# Patient Record
Sex: Female | Born: 1990 | Race: White | Hispanic: No | Marital: Married | Smoking: Former smoker
Health system: Southern US, Community
[De-identification: ages and names within clinical notes are randomized; demographics above are authoritative.]

## PROBLEM LIST (undated history)

## (undated) ENCOUNTER — Inpatient Hospital Stay (HOSPITAL_COMMUNITY): Payer: Self-pay

## (undated) DIAGNOSIS — F419 Anxiety disorder, unspecified: Secondary | ICD-10-CM

## (undated) DIAGNOSIS — F32A Depression, unspecified: Secondary | ICD-10-CM

## (undated) DIAGNOSIS — K589 Irritable bowel syndrome without diarrhea: Secondary | ICD-10-CM

## (undated) DIAGNOSIS — I499 Cardiac arrhythmia, unspecified: Secondary | ICD-10-CM

## (undated) DIAGNOSIS — D509 Iron deficiency anemia, unspecified: Secondary | ICD-10-CM

## (undated) DIAGNOSIS — N200 Calculus of kidney: Secondary | ICD-10-CM

## (undated) DIAGNOSIS — G43909 Migraine, unspecified, not intractable, without status migrainosus: Secondary | ICD-10-CM

## (undated) DIAGNOSIS — Z3490 Encounter for supervision of normal pregnancy, unspecified, unspecified trimester: Secondary | ICD-10-CM

## (undated) DIAGNOSIS — Q6589 Other specified congenital deformities of hip: Secondary | ICD-10-CM

## (undated) DIAGNOSIS — T8859XA Other complications of anesthesia, initial encounter: Secondary | ICD-10-CM

## (undated) DIAGNOSIS — F329 Major depressive disorder, single episode, unspecified: Secondary | ICD-10-CM

## (undated) DIAGNOSIS — T4145XA Adverse effect of unspecified anesthetic, initial encounter: Secondary | ICD-10-CM

## (undated) DIAGNOSIS — M797 Fibromyalgia: Secondary | ICD-10-CM

## (undated) DIAGNOSIS — F250 Schizoaffective disorder, bipolar type: Secondary | ICD-10-CM

## (undated) DIAGNOSIS — M533 Sacrococcygeal disorders, not elsewhere classified: Secondary | ICD-10-CM

## (undated) HISTORY — DX: Migraine, unspecified, not intractable, without status migrainosus: G43.909

## (undated) HISTORY — DX: Iron deficiency anemia, unspecified: D50.9

## (undated) HISTORY — DX: Major depressive disorder, single episode, unspecified: F32.9

## (undated) HISTORY — PX: SHOULDER SURGERY: SHX246

## (undated) HISTORY — DX: Cardiac arrhythmia, unspecified: I49.9

## (undated) HISTORY — DX: Fibromyalgia: M79.7

## (undated) HISTORY — DX: Depression, unspecified: F32.A

## (undated) HISTORY — DX: Calculus of kidney: N20.0

## (undated) HISTORY — DX: Anxiety disorder, unspecified: F41.9

---

## 2008-10-22 ENCOUNTER — Emergency Department (HOSPITAL_COMMUNITY): Admission: EM | Admit: 2008-10-22 | Discharge: 2008-10-23 | Payer: Self-pay | Admitting: Emergency Medicine

## 2009-03-14 ENCOUNTER — Inpatient Hospital Stay (HOSPITAL_COMMUNITY): Admission: AD | Admit: 2009-03-14 | Discharge: 2009-03-14 | Payer: Self-pay | Admitting: Obstetrics and Gynecology

## 2009-03-19 ENCOUNTER — Inpatient Hospital Stay (HOSPITAL_COMMUNITY): Admission: AD | Admit: 2009-03-19 | Discharge: 2009-03-19 | Payer: Self-pay | Admitting: Obstetrics and Gynecology

## 2009-03-27 ENCOUNTER — Inpatient Hospital Stay (HOSPITAL_COMMUNITY): Admission: AD | Admit: 2009-03-27 | Discharge: 2009-03-27 | Payer: Self-pay | Admitting: Obstetrics and Gynecology

## 2009-03-27 ENCOUNTER — Inpatient Hospital Stay (HOSPITAL_COMMUNITY): Admission: AD | Admit: 2009-03-27 | Discharge: 2009-03-30 | Payer: Self-pay | Admitting: Obstetrics and Gynecology

## 2011-04-12 LAB — CBC
HCT: 27.2 % — ABNORMAL LOW (ref 36.0–46.0)
Hemoglobin: 10.8 g/dL — ABNORMAL LOW (ref 12.0–15.0)
MCHC: 33.5 g/dL (ref 30.0–36.0)
MCV: 82.7 fL (ref 78.0–100.0)
MCV: 83.7 fL (ref 78.0–100.0)
RBC: 3.26 MIL/uL — ABNORMAL LOW (ref 3.87–5.11)
RBC: 3.88 MIL/uL (ref 3.87–5.11)
RDW: 15.4 % (ref 11.5–15.5)
WBC: 10.2 10*3/uL (ref 4.0–10.5)
WBC: 9.3 10*3/uL (ref 4.0–10.5)

## 2011-04-12 LAB — RPR: RPR Ser Ql: NONREACTIVE

## 2011-05-15 NOTE — Discharge Summary (Signed)
Teresa Bartlett, Teresa Bartlett          ACCOUNT NO.:  [id number]   MEDICAL RECORD NO.:  [id number]          PATIENT TYPE:  INP   LOCATION:  9110                          FACILITY:  [facility]   PHYSICIAN:  Sherron Monday, MD        DATE OF BIRTH:  03-14-1990   DATE OF ADMISSION:  03/27/2009  DATE OF DISCHARGE:  03/30/2009                               DISCHARGE SUMMARY   ADMITTING DIAGNOSIS:  Intrauterine pregnancy at term with spontaneous  rupture of membranes.   DISCHARGE DIAGNOSIS:  Intrauterine pregnancy at term with spontaneous  rupture of membranes, delivered.   HISTORY OF PRESENT ILLNESS:  A 21 year old G1, P0 at 31 plus weeks by 34-  week ultrasound with EDC of April 12, 2009, presents with complaints of  questionable loss of fluid and contractions.  She had been seen with  similar symptoms, previously had been unable to document rupture of  membranes and fundus exam had a positive fern.  Her prenatal care was  complicated by anxiety that was treated with Zoloft and p.r.n. Xanax at  32 weeks, otherwise uncomplicated care.  Please see the prenatal forms  for complete history.   PAST MEDICAL HISTORY:  Significant for mild asthma, irritable bowel  syndrome, anxiety, and depression.   PAST SURGICAL HISTORY:  Not significant.   PAST OB/GYN HISTORY:  G1 is present pregnancy.  No history of any  abnormal Pap smears or sexually transmitted diseases.   MEDICATIONS:  Zoloft.   ALLERGIES:  No known drug allergies.   SOCIAL HISTORY:  The patient denies alcohol, tobacco, or drug use.   PRENATAL LABORATORY DATA:  RPR nonreactive, rubella immune, hepatitis B  surface antigen negative, and HIV negative.  Blood type O negative.  Antibody screen negative.  Gonorrhea negative and chlamydia negative.  Cystic fibrosis screen negative.  Glucola is 95.  Group B strep was  positive.   On admission, she was afebrile.  Vital signs stable on a benign exam.  Penicillin was used for group B strep  prophylaxis and she was admitted  and Pitocin was used to augment her labor.  She progressed to complete,  complete +2, pushed for approximately 4-5 minutes to deliver a viable  female infant at 955 with Apgars of 8 at 1-minute and 9 at 5-minutes and a  weight of 7 pounds 4 ounces.  Placenta was expressed intact at 955.  OB  lacerations were first-degree perineal bilateral labial, repaired with 3-  0 Vicryl in typical fashion.  EBL was less than 500 mL.  Her postpartum  course was relatively uncomplicated.  She remained afebrile with vital  signs stable and her hemoglobin decreased from 10.8-8.9, which she was  tolerating well.  On the day of discharge postpartum day #2, she was  without complaint.  She had normal lochia.  Her pain was well  controlled.  She was ambulating and voiding without difficulty.  She was  discharged home with routine discharge instructions and numbers to call  if any questions or problems as well as prescriptions for Motrin,  Vicodin, prenatal vitamins and iron.  She will follow up in the office  in approximately 6 weeks.   DISCHARGE INFORMATION:  She is O negative, rubella immune, breast-  feeding.  Hemoglobin 10.8 and she is unsure about contraception.  This  will be discussed at a 6-week checkup.      Sherron Monday, MD  Electronically Signed     JB/MEDQ  D:  03/30/2009  T:  03/30/2009  Job:  [id number]

## 2011-09-27 ENCOUNTER — Encounter: Payer: Self-pay | Admitting: Obstetrics & Gynecology

## 2011-10-02 LAB — URINALYSIS, ROUTINE W REFLEX MICROSCOPIC
Hgb urine dipstick: NEGATIVE
Nitrite: NEGATIVE
Protein, ur: NEGATIVE
pH: 6

## 2011-10-02 LAB — CBC
HCT: 36.8
Hemoglobin: 12.3
Platelets: 287
RBC: 4.16
WBC: 10.3

## 2011-10-02 LAB — URINE MICROSCOPIC-ADD ON

## 2011-10-02 LAB — DIFFERENTIAL
Basophils Absolute: 0.1
Basophils Relative: 1
Lymphocytes Relative: 28
Monocytes Absolute: 0.9
Neutro Abs: 6

## 2011-10-02 LAB — POCT PREGNANCY, URINE: Preg Test, Ur: POSITIVE

## 2012-01-01 LAB — HM PAP SMEAR

## 2012-01-01 NOTE — L&D Delivery Note (Signed)
Delivery Note Pt received epidural but was never completely functional.  She progressed rapidly to complete dilation and pushed about 5 minutes.  At 3:11 AM a healthy female was delivered via Vaginal, Spontaneous Delivery (Presentation: ; Occiput Anterior).  APGAR: 8, 9; weight pending .   Placenta status: Intact, Spontaneous.  Cord:  with the following complications: Nuchal x 1 delivered through.  Anesthesia: Epidural Local  Episiotomy: None Lacerations: 1st degree;Vaginal Suture Repair: 3.0 vicryl rapide Est. Blood Loss (mL): 400cc  Mom to postpartum.  Baby to nursery-stable. Rh negative, Declines circumcision. [name] 07/01/2012, 3:31 AM

## 2012-01-23 LAB — OB RESULTS CONSOLE ABO/RH: RH Type: NEGATIVE

## 2012-01-23 LAB — OB RESULTS CONSOLE GC/CHLAMYDIA: Gonorrhea: NEGATIVE

## 2012-01-23 LAB — OB RESULTS CONSOLE ANTIBODY SCREEN: Antibody Screen: NEGATIVE

## 2012-01-23 LAB — OB RESULTS CONSOLE RUBELLA ANTIBODY, IGM: Rubella: IMMUNE

## 2012-02-20 ENCOUNTER — Ambulatory Visit: Payer: Medicaid Other | Attending: Obstetrics and Gynecology | Admitting: Physical Therapy

## 2012-02-20 DIAGNOSIS — M25559 Pain in unspecified hip: Secondary | ICD-10-CM | POA: Insufficient documentation

## 2012-02-20 DIAGNOSIS — R262 Difficulty in walking, not elsewhere classified: Secondary | ICD-10-CM | POA: Insufficient documentation

## 2012-02-20 DIAGNOSIS — IMO0001 Reserved for inherently not codable concepts without codable children: Secondary | ICD-10-CM | POA: Insufficient documentation

## 2012-02-20 DIAGNOSIS — M25669 Stiffness of unspecified knee, not elsewhere classified: Secondary | ICD-10-CM | POA: Insufficient documentation

## 2012-02-25 ENCOUNTER — Ambulatory Visit: Payer: Self-pay | Admitting: Physical Therapy

## 2012-02-27 ENCOUNTER — Ambulatory Visit: Payer: Medicaid Other

## 2012-03-05 ENCOUNTER — Ambulatory Visit: Payer: Medicaid Other | Attending: Obstetrics and Gynecology | Admitting: Physical Therapy

## 2012-03-05 DIAGNOSIS — R262 Difficulty in walking, not elsewhere classified: Secondary | ICD-10-CM | POA: Insufficient documentation

## 2012-03-05 DIAGNOSIS — IMO0001 Reserved for inherently not codable concepts without codable children: Secondary | ICD-10-CM | POA: Insufficient documentation

## 2012-03-05 DIAGNOSIS — M25669 Stiffness of unspecified knee, not elsewhere classified: Secondary | ICD-10-CM | POA: Insufficient documentation

## 2012-03-05 DIAGNOSIS — M25559 Pain in unspecified hip: Secondary | ICD-10-CM | POA: Insufficient documentation

## 2012-03-26 DIAGNOSIS — O479 False labor, unspecified: Secondary | ICD-10-CM

## 2012-03-27 ENCOUNTER — Inpatient Hospital Stay (HOSPITAL_COMMUNITY)
Admission: AD | Admit: 2012-03-27 | Discharge: 2012-03-27 | Disposition: A | Discharge status: Home / Self Care | Payer: Medicaid Other | Source: Ambulatory Visit | Attending: Obstetrics and Gynecology | Admitting: Obstetrics and Gynecology

## 2012-03-27 ENCOUNTER — Encounter (HOSPITAL_COMMUNITY): Payer: Self-pay

## 2012-03-27 DIAGNOSIS — O4700 False labor before 37 completed weeks of gestation, unspecified trimester: Secondary | ICD-10-CM | POA: Insufficient documentation

## 2012-03-27 DIAGNOSIS — O479 False labor, unspecified: Secondary | ICD-10-CM

## 2012-03-27 NOTE — MAU Note (Signed)
Ctx's started this morning, every hour, have continued and gotten closer all day, timing since 1500, now 7-10 min.  They have gotten stronger.

## 2012-03-27 NOTE — Discharge Instructions (Signed)

## 2012-03-27 NOTE — MAU Provider Note (Signed)
  History     CSN: [id number]  Arrival date and time: 03/27/12 1730   None     Chief Complaint  Patient presents with  . Labor Eval   HPI Teresa Bartlett is 22 y.o. G2P1001 [redacted]w[redacted]d weeks presenting with report of contractions.  Began this am, occuring "sporadic and hourly", and at 2:30 became q20-25 minutes a part and then at 5pm q5-8 minutes a part with 2 contractions bein q2 minutes.  Denies vaginal bleeding.  "extra" vaginal discharge but states it doesn't look like "liquidy, amniotic fluid".  Reports having a slow leak of amniotic fluid with previous discharge at 37 weeks, inducted 30 hrs later.  Patient is stable, reporting that the contractions are less often and not as strong at this time.     Past Medical History  Diagnosis Date  . Asthma     Past Surgical History  Procedure Date  . No past surgeries     No family history on file.  History  Substance Use Topics  . Smoking status: Never Smoker   . Smokeless tobacco: Not on file  . Alcohol Use: No    Allergies: Allergies not on file  No prescriptions prior to admission    Review of Systems  Gastrointestinal: Positive for abdominal pain (contractions).  Genitourinary:       Negative for vaginal bleeding or leaking of fluid. Positive for a little more discharge than normal   Physical Exam   Blood pressure 119/73, pulse 99, temperature 98.2 F (36.8 C), temperature source Oral, resp. rate 20, height 5' 4.5" (1.638 m), weight 120.657 kg (266 lb).  Physical Exam  Constitutional: She is oriented to person, place, and time. She appears well-developed and well-nourished. No distress.  Neurological: She is alert and oriented to person, place, and time.  Skin: Skin is warm and dry.  Psychiatric: She has a normal mood and affect. Her behavior is normal.   Cervical exam by Denny Peon, RN @19 :00  Cervix is closed, posterior, thick and high.   At the time of the cervical exam, the patient reports she is no longer having  contractions.   FMS baseline 145, moderate variability.  Contractions are not seen MAU Course  Procedures  MDM Reported FMS and cervical exam to Dr. Senaida Ores at 19:10.  Order given to discharge to home.  Assessment and Plan  A:  Braxton-Hicks Contractions  P:  Continue to stay well hydrated.      Keep your scheduled appointment Teresa Bartlett,EVE M 03/27/2012, 6:06 PM

## 2012-06-05 ENCOUNTER — Inpatient Hospital Stay (HOSPITAL_COMMUNITY)
Admission: AD | Admit: 2012-06-05 | Discharge: 2012-06-06 | DRG: 778 | Disposition: A | Discharge status: Home / Self Care | Payer: Medicaid Other | Source: Ambulatory Visit | Attending: Obstetrics and Gynecology | Admitting: Obstetrics and Gynecology

## 2012-06-05 ENCOUNTER — Encounter (HOSPITAL_COMMUNITY): Payer: Self-pay | Admitting: *Deleted

## 2012-06-05 DIAGNOSIS — O4700 False labor before 37 completed weeks of gestation, unspecified trimester: Principal | ICD-10-CM | POA: Diagnosis present

## 2012-06-05 DIAGNOSIS — O4702 False labor before 37 completed weeks of gestation, second trimester: Secondary | ICD-10-CM

## 2012-06-05 HISTORY — DX: Adverse effect of unspecified anesthetic, initial encounter: T41.45XA

## 2012-06-05 HISTORY — DX: Sacrococcygeal disorders, not elsewhere classified: M53.3

## 2012-06-05 HISTORY — DX: Other complications of anesthesia, initial encounter: T88.59XA

## 2012-06-05 HISTORY — DX: Other specified congenital deformities of hip: Q65.89

## 2012-06-05 NOTE — MAU Note (Signed)
Pt c/o u/c's all day and have most recently been q2-3 min since 2000.  Denies any leaking or bleeding.

## 2012-06-05 NOTE — MAU Note (Signed)
Contractions every 2-3 minutes since 830pm tonight. Cervix dilated 1/40% today in office. No Lof, no vaginal bleeding. Positive fetal movement.

## 2012-06-06 ENCOUNTER — Encounter (HOSPITAL_COMMUNITY): Payer: Self-pay | Admitting: *Deleted

## 2012-06-06 LAB — CBC
MCH: 28.1 pg (ref 26.0–34.0)
MCHC: 32.7 g/dL (ref 30.0–36.0)
RDW: 14.7 % (ref 11.5–15.5)

## 2012-06-06 LAB — RPR: RPR Ser Ql: NONREACTIVE

## 2012-06-06 MED ORDER — OXYCODONE-ACETAMINOPHEN 5-325 MG PO TABS: 1.0000 | ORAL_TABLET | ORAL | Status: DC | PRN

## 2012-06-06 MED ORDER — OXYTOCIN 20 UNITS IN LACTATED RINGERS INFUSION - SIMPLE: 125.0000 mL/h | Freq: Once | INTRAVENOUS | Status: DC

## 2012-06-06 MED ORDER — OXYTOCIN BOLUS FROM INFUSION
500.0000 mL | Freq: Once | INTRAVENOUS | Status: DC
  Filled 2012-06-06: qty 500

## 2012-06-06 MED ORDER — LACTATED RINGERS IV SOLN: 500.0000 mL | INTRAVENOUS | Status: DC | PRN

## 2012-06-06 MED ORDER — LIDOCAINE HCL (PF) 1 % IJ SOLN: 30.0000 mL | INTRAMUSCULAR | Status: DC | PRN

## 2012-06-06 MED ORDER — IBUPROFEN 600 MG PO TABS: 600.0000 mg | ORAL_TABLET | Freq: Four times a day (QID) | ORAL | Status: DC | PRN

## 2012-06-06 MED ORDER — PENICILLIN G POTASSIUM 5000000 UNITS IJ SOLR
2.5000 10*6.[IU] | INTRAVENOUS | Status: DC
  Administered 2012-06-06 (×2): 2.5 10*6.[IU] via INTRAVENOUS
  Filled 2012-06-06 (×4): qty 2.5

## 2012-06-06 MED ORDER — BUTORPHANOL TARTRATE 2 MG/ML IJ SOLN
INTRAMUSCULAR | Status: AC
  Filled 2012-06-06: qty 1

## 2012-06-06 MED ORDER — PENICILLIN G POTASSIUM 5000000 UNITS IJ SOLR
5.0000 10*6.[IU] | Freq: Once | INTRAVENOUS | Status: AC
  Administered 2012-06-06: 5 10*6.[IU] via INTRAVENOUS
  Filled 2012-06-06: qty 5

## 2012-06-06 MED ORDER — LACTATED RINGERS IV SOLN
INTRAVENOUS | Status: DC
  Administered 2012-06-06: 125 mL/h via INTRAVENOUS
  Administered 2012-06-06: 10:00:00 via INTRAVENOUS

## 2012-06-06 MED ORDER — BUTORPHANOL TARTRATE 2 MG/ML IJ SOLN: 1.0000 mg | Freq: Once | INTRAMUSCULAR | Status: DC

## 2012-06-06 NOTE — Discharge Summary (Signed)
  Discharge summary  Discharge DX Preterm pregnancy at 36 weeks False labor  Hospital course Pt observed for 12 hours due to painful contractions but cervix stayed 3.5 cm dilated the entire admission so she was d/c home to f/u in office

## 2012-06-06 NOTE — H&P (Signed)
NAMESHAMS, FILL NO.:  [id number]  MEDICAL RECORD NO.:  [id number]  LOCATION:  9160                          FACILITY:  [facility]  PHYSICIAN:  Malachi Pro. Ambrose Mantle, M.D. DATE OF BIRTH:  02-Sep-1990  DATE OF ADMISSION:  06/05/2012 DATE OF DISCHARGE:                             HISTORY & PHYSICAL   HISTORY OF PRESENT ILLNESS:  This is a 22 year old white female, para 1- 0-0-1, gravida 2, EDC is July 11, 2012, who was admitted because of contractions every 3-4 minutes and cervical change from 1-2 cm to 3-4 cm.  Blood group and type O negative with negative antibody.  Pap smear normal.  Rubella immune.  RPR nonreactive.  Urine culture negative. Hepatitis B surface antigen negative.  HIV negative.  GC and Chlamydia negative.  Cystic fibrosis negative.  Declined quad screen.  She was too late for first trimester screen. One hour Glucola 86.  Group B strep has not been done.  The patient had an ultrasound on January 22, 2012 that showed her average ultrasound age to be 15 weeks and 4 days with [facility] of July 11, 2012 by dates.  Her due date was May 22, 2012.  A follow up ultrasound on February 19, 2012 placed her at 19 weeks and 4 days with an [facility] of July 11, 2012.  On April 28, 2012, the patient complained of passing lots of mucus.  No significant contractions.  She came to our office today on June 05, 2012, on 2 occasions, the cervix was 1-2 cm, presenting part was high.  It did not change, but she states that after she went home her contractions got to be 2 minutes apart more painful. She came back to the maternity admission unit.  Her cervix was 3-4 cm, 50%, vertex at a -2 station.  She is admitted but nothing will be done to try a augment labor.  If she goes into labor, nothing will be done to stop it.  ALLERGIES:  No known allergies.  No latex allergy.  MEDICAL HISTORY:  History of generalized anxiety, asthma, depression, with her first pregnancy she did have group B  strep.  She has irritable bowel syndrome.  OBSTETRIC HISTORY:  On March 28, 2009, she delivered a 7-pound and 4- ounce female vaginally.  She is active, 2 years of college.  Denies alcohol, tobacco, and substance abuse.  FAMILY MEDICAL HISTORY:  Her mother has fibromyalgia.  Father has hypertension and he has had an MI.  Her paternal grandmother and grandfather have hypertension, and the paternal grandfather has heart disease.  PHYSICAL EXAMINATION:  GENERAL:  On admission, well-developed, quite obese white female, in no distress except for contractions every 3-4 minutes. VITAL SIGNS:  Temperature 98.5, pulse 100, respirations 16, blood pressure 100/60. HEART:  Normal size and sounds.  No murmurs. LUNGS:  Clear to auscultation. ABDOMEN:  Soft.  Fundal height, there was last recorded on May 12, 2012 was 31-1/2 cm.  She was examined in the office on June 05, 2012.  Her fundal height was 35 cm.  Fetal heart tones are normal.  The cervix is 3- 4 cm, 50%, vertex at a -2 station.  ADMITTING IMPRESSION:  Intrauterine pregnancy at 26  weeks and 6 days, seemed to be 35 weeks and 0 days, with cervical change.  The patient is admitted for observation and delivery if labor ensues.     Malachi Pro. Ambrose Mantle, M.D.     TFH/MEDQ  D:  06/05/2012  T:  06/06/2012  Job:  [id number]

## 2012-06-06 NOTE — Progress Notes (Signed)
Patient ID: Teresa Bartlett, female   DOB: 1990-09-10, 22 y.o.   MRN: [id number] Pt asking to go home. FHR reactive Cervix same at 50/3-4/-3  Sent home with labor precautions \ Declines ambien or pain meds

## 2012-06-07 ENCOUNTER — Inpatient Hospital Stay (HOSPITAL_COMMUNITY)
Admission: AD | Admit: 2012-06-07 | Discharge: 2012-06-07 | Disposition: A | Discharge status: Home / Self Care | Payer: Medicaid Other | Source: Ambulatory Visit | Attending: Obstetrics and Gynecology | Admitting: Obstetrics and Gynecology

## 2012-06-07 ENCOUNTER — Encounter (HOSPITAL_COMMUNITY): Payer: Self-pay | Admitting: Obstetrics and Gynecology

## 2012-06-07 DIAGNOSIS — O4700 False labor before 37 completed weeks of gestation, unspecified trimester: Secondary | ICD-10-CM | POA: Insufficient documentation

## 2012-06-07 NOTE — MAU Note (Signed)
Patient was admitted on Thursday night discharge on Friday, last night had contractions 3 minutes apart then settled down   35 weeks was 4/70

## 2012-06-15 ENCOUNTER — Inpatient Hospital Stay (HOSPITAL_COMMUNITY)
Admission: AD | Admit: 2012-06-15 | Discharge: 2012-06-15 | DRG: 778 | Disposition: A | Discharge status: Home / Self Care | Payer: Medicaid Other | Source: Ambulatory Visit | Attending: Obstetrics and Gynecology | Admitting: Obstetrics and Gynecology

## 2012-06-15 ENCOUNTER — Encounter (HOSPITAL_COMMUNITY): Payer: Self-pay | Admitting: *Deleted

## 2012-06-15 DIAGNOSIS — Z3490 Encounter for supervision of normal pregnancy, unspecified, unspecified trimester: Secondary | ICD-10-CM

## 2012-06-15 DIAGNOSIS — O4700 False labor before 37 completed weeks of gestation, unspecified trimester: Principal | ICD-10-CM | POA: Diagnosis present

## 2012-06-15 HISTORY — DX: Encounter for supervision of normal pregnancy, unspecified, unspecified trimester: Z34.90

## 2012-06-15 HISTORY — DX: Irritable bowel syndrome, unspecified: K58.9

## 2012-06-15 LAB — CBC
HCT: 35.1 % — ABNORMAL LOW (ref 36.0–46.0)
Hemoglobin: 11.6 g/dL — ABNORMAL LOW (ref 12.0–15.0)
MCH: 28.2 pg (ref 26.0–34.0)
MCHC: 33 g/dL (ref 30.0–36.0)
RBC: 4.11 MIL/uL (ref 3.87–5.11)

## 2012-06-15 MED ORDER — PENICILLIN G POTASSIUM 5000000 UNITS IJ SOLR
5.0000 10*6.[IU] | Freq: Once | INTRAVENOUS | Status: AC
  Administered 2012-06-15: 5 10*6.[IU] via INTRAVENOUS
  Filled 2012-06-15: qty 5

## 2012-06-15 MED ORDER — OXYTOCIN BOLUS FROM INFUSION
500.0000 mL | Freq: Once | INTRAVENOUS | Status: DC
  Filled 2012-06-15: qty 500

## 2012-06-15 MED ORDER — OXYCODONE-ACETAMINOPHEN 5-325 MG PO TABS: 1.0000 | ORAL_TABLET | ORAL | Status: DC | PRN

## 2012-06-15 MED ORDER — LACTATED RINGERS IV SOLN: 500.0000 mL | INTRAVENOUS | Status: DC | PRN

## 2012-06-15 MED ORDER — PENICILLIN G POTASSIUM 5000000 UNITS IJ SOLR
2.5000 10*6.[IU] | INTRAVENOUS | Status: DC
  Administered 2012-06-15: 2.5 10*6.[IU] via INTRAVENOUS
  Filled 2012-06-15 (×5): qty 2.5

## 2012-06-15 MED ORDER — LACTATED RINGERS IV SOLN
INTRAVENOUS | Status: DC
  Administered 2012-06-15: 125 mL/h via INTRAVENOUS

## 2012-06-15 MED ORDER — ACETAMINOPHEN 325 MG PO TABS: 650.0000 mg | ORAL_TABLET | ORAL | Status: DC | PRN

## 2012-06-15 MED ORDER — CITRIC ACID-SODIUM CITRATE 334-500 MG/5ML PO SOLN: 30.0000 mL | ORAL | Status: DC | PRN

## 2012-06-15 MED ORDER — ONDANSETRON HCL 4 MG/2ML IJ SOLN: 4.0000 mg | Freq: Four times a day (QID) | INTRAMUSCULAR | Status: DC | PRN

## 2012-06-15 MED ORDER — FLEET ENEMA 7-19 GM/118ML RE ENEM: 1.0000 | ENEMA | RECTAL | Status: DC | PRN

## 2012-06-15 MED ORDER — OXYTOCIN 20 UNITS IN LACTATED RINGERS INFUSION - SIMPLE: 125.0000 mL/h | Freq: Once | INTRAVENOUS | Status: DC

## 2012-06-15 MED ORDER — IBUPROFEN 600 MG PO TABS: 600.0000 mg | ORAL_TABLET | Freq: Four times a day (QID) | ORAL | Status: DC | PRN

## 2012-06-15 MED ORDER — BUTORPHANOL TARTRATE 2 MG/ML IJ SOLN
2.0000 mg | Freq: Once | INTRAMUSCULAR | Status: AC
  Administered 2012-06-15: 2 mg via INTRAVENOUS
  Filled 2012-06-15: qty 1

## 2012-06-15 MED ORDER — BUTORPHANOL TARTRATE 2 MG/ML IJ SOLN: 1.0000 mg | INTRAMUSCULAR | Status: DC | PRN

## 2012-06-15 MED ORDER — LIDOCAINE HCL (PF) 1 % IJ SOLN: 30.0000 mL | INTRAMUSCULAR | Status: DC | PRN

## 2012-06-15 NOTE — H&P (Signed)
Ramatoulaye Pack Swab is a 22 y.o. female G2P1001 at 36+ with contractions, ? Cervical change. +FM, no LOF, no VB, ctx - inc in intensity and frequency. Uncomplicated PNC except Hip dysplasia and late to care Maternal Medical History:  Reason for admission: Reason for admission: contractions.  Contractions: Onset was more than 2 days ago.   Frequency: regular.   Perceived severity is moderate.    Fetal activity: Perceived fetal activity is normal.      OB History    Grav Para Term Preterm Abortions TAB SAB Ect Mult Living   2 1 1  0 0 0 0 0 0 1    G1 37 wk SVD, 7#4, female; G2 present, no abn pap, no STDs Past Medical History  Diagnosis Date  . Asthma   . Hip dysplasia   . Sacroiliac joint dysfunction of both sides   . IBS (irritable bowel syndrome)   . Complication of anesthesia   . Normal pregnancy 06/15/2012   Past Surgical History  Procedure Date  . No past surgeries    Family History: family history includes Arthritis in her maternal grandmother and mother; Cancer in her paternal grandmother; Diabetes in her father, maternal grandmother, mother, and paternal grandfather; Heart disease in her father and paternal uncle; Hyperlipidemia in her father, paternal grandfather, and paternal grandmother; Hypertension in her father, paternal grandfather, and paternal grandmother; Kidney disease in her mother; and Mental illness in her father, mother, and paternal grandfather.  There is no history of Other. Social History:  reports that she quit smoking about 4 years ago. She does not have any smokeless tobacco history on file. She reports that she does not drink alcohol or use illicit drugs.married Meds PNV All NKDA   Prenatal Transfer Tool  Maternal Diabetes: No Genetic Screening: Declined Maternal Ultrasounds/Referrals: Normal Fetal Ultrasounds or other Referrals:  None Maternal Substance Abuse:  No Significant Maternal Medications:  None Significant Maternal Lab Results:  Lab values  include: Other: see prenatal record GBBS unknown Other Comments:  previa resolved  Review of Systems  Constitutional: Negative.   HENT: Negative.   Eyes: Negative.   Respiratory: Negative.   Cardiovascular: Negative.   Gastrointestinal: Negative.   Genitourinary: Negative.   Musculoskeletal: Negative.   Neurological: Negative.   Psychiatric/Behavioral: Negative.     Dilation: 4.5 Effacement (%): 70 Station: -3 Exam by:: L. McDaniel Rn Blood pressure 111/59, pulse 106, temperature 97.8 F (36.6 C), temperature source Oral, resp. rate 18, height 5\' 4"  (1.626 m), weight 124.739 kg (275 lb). Maternal Exam:  Uterine Assessment: Contraction strength is moderate.  Contraction frequency is irregular.   Abdomen: Fundal height is appropriate for gestation, obese.   Fetal presentation: vertex     Physical Exam  Constitutional: She is oriented to person, place, and time. She appears well-developed and well-nourished.  HENT:  Head: Normocephalic and atraumatic.  Neck: Normal range of motion. Neck supple. No thyromegaly present.  Cardiovascular: Normal rate and regular rhythm.   Respiratory: Effort normal and breath sounds normal. No respiratory distress.  GI: Soft. Bowel sounds are normal. There is no tenderness.  Musculoskeletal: Normal range of motion.  Neurological: She is alert and oriented to person, place, and time.  Skin: Skin is warm and dry.  Psychiatric: She has a normal mood and affect. Her behavior is normal.    Prenatal labs: ABO, Rh: O/Negative/-- (01/23 0000) Antibody: Negative (01/23 0000) Rubella: Immune (01/23 0000) RPR: NON REACTIVE (06/07 0015)  HBsAg: Negative (01/23 0000)  HIV: Non-reactive (  01/23 0000)  GBS:    Hgb 12.3/Plt 345K/ Pap WNL/ GC neg/ Chl neg/ CF neg/ declined screening/ glucola 86  Rhogam 4/19  15wk [place] to date, ant previa.  Female, nl anat, ant plac, previa resolved Assessment/Plan: 21yo G2P1001 at 36 + with contractions, continued, but  questionable cervical change.   Monitored for 6 hours, pt states hurting more.  Will give stadol and recheck in 2 hours, likely d/c if no change.  F/u in office as scheduled   BOVARD,Miken Stecher 06/15/2012, 9:55 AM

## 2012-06-15 NOTE — Progress Notes (Signed)
Dr Ellyn Hack notified of pt status, pain level, FHR tracing, UC pattern and SVE. Order to D/C pt home. Discussed POC with pt and has no further questions at this time.

## 2012-06-15 NOTE — MAU Note (Signed)
Contracting since 0230 Sat am. Had diarrhea 4times yest and some nausea. Did this with first preg. At 36 wks when went into labor. Ctxs painful since 2230. Leaked some fld once arrived to hospital. THinks could have been urine

## 2012-06-16 NOTE — Discharge Summary (Signed)
  Pt admitted at 36 + weeks for possible labor,Questionable cervical change in MAU.  Monitored for 8 hours with no cervical change.  Painful ctx - given dose of stadol, ctx spaced out.  Given IVF during admission.  Given labor precautions

## 2012-06-26 ENCOUNTER — Telehealth (HOSPITAL_COMMUNITY): Payer: Self-pay | Admitting: *Deleted

## 2012-06-26 ENCOUNTER — Encounter (HOSPITAL_COMMUNITY): Payer: Self-pay | Admitting: *Deleted

## 2012-06-26 NOTE — Telephone Encounter (Signed)
Preadmission screen  

## 2012-06-30 ENCOUNTER — Encounter (HOSPITAL_COMMUNITY): Payer: Self-pay | Admitting: *Deleted

## 2012-06-30 ENCOUNTER — Inpatient Hospital Stay (HOSPITAL_COMMUNITY)
Admission: AD | Admit: 2012-06-30 | Discharge: 2012-07-02 | DRG: 775 | Disposition: A | Discharge status: Home / Self Care | Payer: Medicaid Other | Source: Ambulatory Visit | Attending: Obstetrics and Gynecology | Admitting: Obstetrics and Gynecology

## 2012-06-30 LAB — CBC
MCH: 28.3 pg (ref 26.0–34.0)
MCHC: 33.2 g/dL (ref 30.0–36.0)
Platelets: 243 10*3/uL (ref 150–400)
RDW: 15 % (ref 11.5–15.5)

## 2012-06-30 MED ORDER — OXYTOCIN BOLUS FROM INFUSION
250.0000 mL | Freq: Once | INTRAVENOUS | Status: AC
  Administered 2012-07-01: 250 mL via INTRAVENOUS
  Filled 2012-06-30: qty 500

## 2012-06-30 MED ORDER — OXYTOCIN 40 UNITS IN LACTATED RINGERS INFUSION - SIMPLE MED: 1.0000 m[IU]/min | INTRAVENOUS | Status: DC

## 2012-06-30 MED ORDER — ONDANSETRON HCL 4 MG/2ML IJ SOLN: 4.0000 mg | Freq: Four times a day (QID) | INTRAMUSCULAR | Status: DC | PRN

## 2012-06-30 MED ORDER — OXYTOCIN 40 UNITS IN LACTATED RINGERS INFUSION - SIMPLE MED
62.5000 mL/h | Freq: Once | INTRAVENOUS | Status: AC
  Administered 2012-07-01: 62.5 mL/h via INTRAVENOUS
  Filled 2012-06-30: qty 1000

## 2012-06-30 MED ORDER — DIPHENHYDRAMINE HCL 50 MG/ML IJ SOLN: 12.5000 mg | INTRAMUSCULAR | Status: DC | PRN

## 2012-06-30 MED ORDER — FLEET ENEMA 7-19 GM/118ML RE ENEM: 1.0000 | ENEMA | RECTAL | Status: DC | PRN

## 2012-06-30 MED ORDER — OXYCODONE-ACETAMINOPHEN 5-325 MG PO TABS: 1.0000 | ORAL_TABLET | ORAL | Status: DC | PRN

## 2012-06-30 MED ORDER — LACTATED RINGERS IV SOLN
INTRAVENOUS | Status: DC
  Administered 2012-06-30: via INTRAVENOUS

## 2012-06-30 MED ORDER — LACTATED RINGERS IV SOLN
500.0000 mL | Freq: Once | INTRAVENOUS | Status: AC
  Administered 2012-06-30: 1000 mL via INTRAVENOUS

## 2012-06-30 MED ORDER — TERBUTALINE SULFATE 1 MG/ML IJ SOLN: 0.2500 mg | Freq: Once | INTRAMUSCULAR | Status: AC | PRN

## 2012-06-30 MED ORDER — PHENYLEPHRINE 40 MCG/ML (10ML) SYRINGE FOR IV PUSH (FOR BLOOD PRESSURE SUPPORT)
80.0000 ug | PREFILLED_SYRINGE | INTRAVENOUS | Status: DC | PRN
  Filled 2012-06-30: qty 2

## 2012-06-30 MED ORDER — LACTATED RINGERS IV SOLN: 500.0000 mL | INTRAVENOUS | Status: DC | PRN

## 2012-06-30 MED ORDER — EPHEDRINE 5 MG/ML INJ
10.0000 mg | INTRAVENOUS | Status: DC | PRN
  Filled 2012-06-30: qty 2
  Filled 2012-06-30: qty 4

## 2012-06-30 MED ORDER — LIDOCAINE HCL (PF) 1 % IJ SOLN
30.0000 mL | INTRAMUSCULAR | Status: DC | PRN
  Filled 2012-06-30: qty 30

## 2012-06-30 MED ORDER — FENTANYL 2.5 MCG/ML BUPIVACAINE 1/10 % EPIDURAL INFUSION (WH - ANES)
14.0000 mL/h | INTRAMUSCULAR | Status: DC
  Administered 2012-07-01 (×2): 14 mL/h via EPIDURAL
  Filled 2012-06-30 (×2): qty 60

## 2012-06-30 MED ORDER — EPHEDRINE 5 MG/ML INJ
10.0000 mg | INTRAVENOUS | Status: DC | PRN
  Filled 2012-06-30: qty 2

## 2012-06-30 MED ORDER — CITRIC ACID-SODIUM CITRATE 334-500 MG/5ML PO SOLN: 30.0000 mL | ORAL | Status: DC | PRN

## 2012-06-30 MED ORDER — PHENYLEPHRINE 40 MCG/ML (10ML) SYRINGE FOR IV PUSH (FOR BLOOD PRESSURE SUPPORT)
80.0000 ug | PREFILLED_SYRINGE | INTRAVENOUS | Status: DC | PRN
  Filled 2012-06-30: qty 2
  Filled 2012-06-30: qty 5

## 2012-06-30 MED ORDER — ACETAMINOPHEN 325 MG PO TABS: 650.0000 mg | ORAL_TABLET | ORAL | Status: DC | PRN

## 2012-06-30 MED ORDER — IBUPROFEN 600 MG PO TABS: 600.0000 mg | ORAL_TABLET | Freq: Four times a day (QID) | ORAL | Status: DC | PRN

## 2012-06-30 MED ORDER — ALBUTEROL SULFATE HFA 108 (90 BASE) MCG/ACT IN AERS
2.0000 | INHALATION_SPRAY | Freq: Four times a day (QID) | RESPIRATORY_TRACT | Status: DC | PRN
  Filled 2012-06-30: qty 6.7

## 2012-06-30 NOTE — H&P (Signed)
Teresa Bartlett is a 22 y.o. female G2P1001 ( EDD 07/11/12 by 15 week [place])  at 38 3/7 weeks  presenting for contractions for several hours building in intensity.  Observed in MAU with cervical change from 4cm to 5+ cm.  Has been admitted multiple times in the past month with contractions with cervix consistently 3-4 cm. Prenatal care uneventful otherwise.  O negative and received rhogam at 28 weeks.    Maternal Medical History:  Reason for admission: Reason for admission: contractions.  Contractions: Onset was 3-5 hours ago.   Frequency: regular.   Perceived severity is moderate.    Fetal activity: Perceived fetal activity is normal.      OB History    Grav Para Term Preterm Abortions TAB SAB Ect Mult Living   2 1 1  0 0 0 0 0 0 1    NSVD 2010 37 weeks 7#4oz  Past Medical History  Diagnosis Date  . Asthma   . Hip dysplasia   . Sacroiliac joint dysfunction of both sides   . IBS (irritable bowel syndrome)   . Normal pregnancy 06/15/2012  . Depression   . Anxiety   . IDA (iron deficiency anemia)   . Complication of anesthesia     convulsion with epidural redose   Past Surgical History  Procedure Date  . No past surgeries    Family History: family history includes Arthritis in her maternal grandmother and mother; Cancer in her paternal grandmother; Depression in her father; Diabetes in her father, maternal grandmother, mother, and paternal grandfather; Fibromyalgia in her mother; Heart attack in her father; Heart disease in her father, paternal grandfather, and paternal uncle; Hyperlipidemia in her father, paternal grandfather, and paternal grandmother; Hypertension in her father, paternal grandfather, and paternal grandmother; Kidney disease in her mother; and Mental illness in her father, mother, and paternal grandfather.  There is no history of Other. Social History:  reports that she quit smoking about 4 years ago. Her smoking use included Cigarettes. She quit after 2 years of  use. She has never used smokeless tobacco. She reports that she does not drink alcohol or use illicit drugs.   Prenatal Transfer Tool  Maternal Diabetes: No Genetic Screening: Declined Maternal Ultrasounds/Referrals: Normal Fetal Ultrasounds or other Referrals:  None Maternal Substance Abuse:  No Significant Maternal Medications:  None Significant Maternal Lab Results:  None Other Comments:  None  ROS  Dilation: 5 Effacement (%): 70 Station: -2 Exam by:: Elie Confer RN Blood pressure 102/63, pulse 112, temperature 98.1 F (36.7 C), temperature source Oral, resp. rate 16. Maternal Exam:  Uterine Assessment: Contraction strength is moderate.  Contraction frequency is regular.   Abdomen: Patient reports no abdominal tenderness. Fetal presentation: vertex  Introitus: Normal vulva. Normal vagina.    Physical Exam  Constitutional: She is oriented to person, place, and time. She appears well-developed and well-nourished.  Cardiovascular: Normal rate and regular rhythm.   Respiratory: Effort normal and breath sounds normal.  GI: Soft.  Genitourinary: Vagina normal.  Neurological: She is alert and oriented to person, place, and time.  Psychiatric: She has a normal mood and affect. Her behavior is normal.    Prenatal labs: ABO, Rh: O/Negative/-- (01/23 0000) Antibody: Negative (01/23 0000) Rubella: Immune (01/23 0000) RPR: NON REACTIVE (06/16 0550)  HBsAg: Negative (01/23 0000)  HIV: Non-reactive (01/23 0000)  GBS: Negative (06/17 0000)  One hour GTT 86 Declined genetic screens  Assessment/Plan: Pt observed in MAU with cervical change to 5cm and increasingly uncomfortable  with contractions.  Will admit to L&D and receive epidural with AROM and pitocin augmentation if needed. FHR reactive.   [name] 06/30/2012, 10:35 PM

## 2012-06-30 NOTE — MAU Note (Signed)
Pt c/o u/c's q 5 min apart for the past 45 min, pt says they are not strong but when she called office,  MD asked her to come in since she lives in [place].

## 2012-06-30 NOTE — Progress Notes (Signed)
Patient ID: Teresa Bartlett, female   DOB: 12-Sep-1990, 23 y.o.   MRN: [id number] Pt admitted and ctx q 3-5 minutes. Getting fluid bolus for epidural 80/5-6/-1  AROM clear  Pt to receive epidural, will augment as needed if slow progress.

## 2012-07-01 ENCOUNTER — Encounter (HOSPITAL_COMMUNITY): Payer: Self-pay | Admitting: Anesthesiology

## 2012-07-01 ENCOUNTER — Encounter (HOSPITAL_COMMUNITY): Payer: Self-pay | Admitting: *Deleted

## 2012-07-01 ENCOUNTER — Inpatient Hospital Stay (HOSPITAL_COMMUNITY): Payer: Medicaid Other | Admitting: Anesthesiology

## 2012-07-01 LAB — RPR: RPR Ser Ql: NONREACTIVE

## 2012-07-01 MED ORDER — DIPHENHYDRAMINE HCL 25 MG PO CAPS: 25.0000 mg | ORAL_CAPSULE | Freq: Four times a day (QID) | ORAL | Status: DC | PRN

## 2012-07-01 MED ORDER — IBUPROFEN 600 MG PO TABS
600.0000 mg | ORAL_TABLET | Freq: Four times a day (QID) | ORAL | Status: DC
  Administered 2012-07-01 – 2012-07-02 (×6): 600 mg via ORAL
  Filled 2012-07-01 (×6): qty 1

## 2012-07-01 MED ORDER — BENZOCAINE-MENTHOL 20-0.5 % EX AERO
1.0000 "application " | INHALATION_SPRAY | CUTANEOUS | Status: DC | PRN
  Filled 2012-07-01: qty 56

## 2012-07-01 MED ORDER — PRENATAL MULTIVITAMIN CH
1.0000 | ORAL_TABLET | Freq: Every day | ORAL | Status: DC
  Administered 2012-07-02: 1 via ORAL
  Filled 2012-07-01 (×2): qty 1

## 2012-07-01 MED ORDER — TETANUS-DIPHTH-ACELL PERTUSSIS 5-2.5-18.5 LF-MCG/0.5 IM SUSP: 0.5000 mL | Freq: Once | INTRAMUSCULAR | Status: DC

## 2012-07-01 MED ORDER — OXYCODONE-ACETAMINOPHEN 5-325 MG PO TABS
1.0000 | ORAL_TABLET | ORAL | Status: DC | PRN
  Administered 2012-07-01 (×3): 1 via ORAL
  Filled 2012-07-01 (×3): qty 1

## 2012-07-01 MED ORDER — BUPIVACAINE HCL (PF) 0.25 % IJ SOLN
INTRAMUSCULAR | Status: DC | PRN
  Administered 2012-07-01 (×2): 5 mL via EPIDURAL

## 2012-07-01 MED ORDER — LIDOCAINE HCL (PF) 1 % IJ SOLN
INTRAMUSCULAR | Status: DC | PRN
  Administered 2012-07-01 (×3): 4 mL
  Administered 2012-07-01: 30 mL

## 2012-07-01 MED ORDER — FENTANYL CITRATE 0.05 MG/ML IJ SOLN: 100.0000 ug | Freq: Once | INTRAMUSCULAR | Status: DC

## 2012-07-01 MED ORDER — DIBUCAINE 1 % RE OINT: 1.0000 "application " | TOPICAL_OINTMENT | RECTAL | Status: DC | PRN

## 2012-07-01 MED ORDER — ONDANSETRON HCL 4 MG/2ML IJ SOLN: 4.0000 mg | INTRAMUSCULAR | Status: DC | PRN

## 2012-07-01 MED ORDER — ZOLPIDEM TARTRATE 5 MG PO TABS: 5.0000 mg | ORAL_TABLET | Freq: Every evening | ORAL | Status: DC | PRN

## 2012-07-01 MED ORDER — LANOLIN HYDROUS EX OINT: TOPICAL_OINTMENT | CUTANEOUS | Status: DC | PRN

## 2012-07-01 MED ORDER — WITCH HAZEL-GLYCERIN EX PADS: 1.0000 "application " | MEDICATED_PAD | CUTANEOUS | Status: DC | PRN

## 2012-07-01 MED ORDER — ONDANSETRON HCL 4 MG PO TABS: 4.0000 mg | ORAL_TABLET | ORAL | Status: DC | PRN

## 2012-07-01 MED ORDER — SIMETHICONE 80 MG PO CHEW
80.0000 mg | CHEWABLE_TABLET | ORAL | Status: DC | PRN
  Administered 2012-07-01: 80 mg via ORAL

## 2012-07-01 MED ORDER — FENTANYL CITRATE 0.05 MG/ML IJ SOLN
INTRAMUSCULAR | Status: AC
  Filled 2012-07-01: qty 2

## 2012-07-01 MED ORDER — SENNOSIDES-DOCUSATE SODIUM 8.6-50 MG PO TABS
2.0000 | ORAL_TABLET | Freq: Every day | ORAL | Status: DC
  Administered 2012-07-01: 2 via ORAL

## 2012-07-01 MED ORDER — FENTANYL CITRATE 0.05 MG/ML IJ SOLN
INTRAMUSCULAR | Status: DC | PRN
  Administered 2012-07-01: 100 ug via EPIDURAL

## 2012-07-01 NOTE — Anesthesia Postprocedure Evaluation (Signed)
  Anesthesia Post Note  Patient: Teresa Bartlett  Procedure(s) Performed: * No procedures listed *  Anesthesia type: Epidural  Patient location: Mother/Baby  Post pain: Pain level controlled  Post assessment: Post-op Vital signs reviewed  Last Vitals:  Filed Vitals:   07/01/12 0950  BP: 102/68  Pulse: 102  Temp: 36.6 C  Resp: 20    Post vital signs: Reviewed  Level of consciousness:alert  Complications: No apparent anesthesia complications

## 2012-07-01 NOTE — Progress Notes (Signed)
UR Chart review completed.  

## 2012-07-01 NOTE — Progress Notes (Signed)
Post Partum Day 0 Subjective: no complaints, voiding and tolerating PO  Does state has bruising and knot to right of epidural site with some related pain  Objective: Blood pressure 113/70, pulse 109, temperature 98.9 F (37.2 C), temperature source Oral, resp. rate 18, SpO2 96.00%, unknown if currently breastfeeding.  Physical Exam:  General: alert and cooperative Lochia: appropriate Uterine Fundus: firm Back  No knot palpable in sittiing position   [facility] 06/30/12 2315  HGB 11.6*  HCT 34.9*    Assessment/Plan: Plan for discharge tomorrow   LOS: 1 day   Dequann Vandervelden W 07/01/2012, 9:36 AM

## 2012-07-01 NOTE — Anesthesia Preprocedure Evaluation (Addendum)
Anesthesia Evaluation  Patient identified by MRN, date of birth, ID band Patient awake    Reviewed: Allergy & Precautions, H&P , NPO status , Patient's Chart, lab work & pertinent test results, reviewed documented beta blocker date and time   Airway Mallampati: II TM Distance: >3 FB Neck ROM: full    Dental  (+) Teeth Intact   Pulmonary asthma (last inhaler use more than 1 year ago) ,  breath sounds clear to auscultation        Cardiovascular negative cardio ROS  Rhythm:regular Rate:Normal     Neuro/Psych PSYCHIATRIC DISORDERS (depression, anxiety) negative neurological ROS     GI/Hepatic Neg liver ROS, IBS   Endo/Other  Morbid obesity  Renal/GU negative Renal ROS     Musculoskeletal   Abdominal   Peds  Hematology negative hematology ROS (+)   Anesthesia Other Findings Bilateral hip dysplasia - careful with moving and positioning, per pt hips dislocate easily  Reproductive/Obstetrics (+) Pregnancy                          Anesthesia Physical Anesthesia Plan  ASA: III  Anesthesia Plan: Epidural   Post-op Pain Management:    Induction:   Airway Management Planned:   Additional Equipment:   Intra-op Plan:   Post-operative Plan:   Informed Consent: I have reviewed the patients History and Physical, chart, labs and discussed the procedure including the risks, benefits and alternatives for the proposed anesthesia with the patient or authorized representative who has indicated his/her understanding and acceptance.     Plan Discussed with:   Anesthesia Plan Comments:         Anesthesia Quick Evaluation

## 2012-07-01 NOTE — Clinical Social Work Psychosocial (Signed)
    Clinical Social Work Department BRIEF PSYCHOSOCIAL ASSESSMENT 07/01/2012  Patient:  Teresa Bartlett, Teresa Bartlett     Account Number:  [id number]     Admit date:  06/30/2012  Clinical Social Worker:  Andy Gauss  Date/Time:  07/01/2012 12:00 N  Referred by:  Physician  Date Referred:  07/01/2012 Referred for  Behavioral Health Issues   Other Referral:   Interview type:  Patient Other interview type:    PSYCHOSOCIAL DATA Living Status:  HUSBAND Admitted from facility:   Level of care:   Primary support name:  Teresa Bartlett Primary support relationship to patient:  SPOUSE Degree of support available:   Involved    CURRENT CONCERNS Current Concerns  Behavioral Health Issues   Other Concerns:    SOCIAL WORK ASSESSMENT / PLAN Pt has experienced anxiety and depression since a teenager. According to the pt, she experienced symptoms "really bad with last pregnancy."  Her symptoms were treated with Zoloft and Xanax.  She has not taken any medications recently, as she denies any symptoms since last pregnancy. Pt denies any history of SI.  Pt's spouse at the bedside, aware of history and supportive.  Pt was appropriate and appears to be bonding well with the infant.   Assessment/plan status:  No Further Intervention Required Other assessment/ plan:   Information/referral to community resources:   Pt seems to be self aware and agrees to seek medical attention if needed.    PATIENT'S/FAMILY'S RESPONSE TO PLAN OF CARE: Pt appeared to be appreciative of Sw consult.

## 2012-07-01 NOTE — Anesthesia Procedure Notes (Signed)
Epidural Patient location during procedure: OB Start time: 07/01/2012 12:34 AM Reason for block: procedure for pain  Staffing Performed by: anesthesiologist   Preanesthetic Checklist Completed: patient identified, site marked, surgical consent, pre-op evaluation, timeout performed, IV checked, risks and benefits discussed and monitors and equipment checked  Epidural Patient position: sitting Prep: site prepped and draped and DuraPrep Patient monitoring: continuous pulse ox and blood pressure Approach: midline Injection technique: LOR air  Needle:  Needle type: Tuohy  Needle gauge: 17 G Needle length: 9 cm Needle insertion depth: 6 cm Catheter type: closed end flexible Catheter size: 19 Gauge Catheter at skin depth: 11 cm Test dose: negative  Assessment Events: blood not aspirated, injection not painful, no injection resistance, negative IV test and no paresthesia  Additional Notes Discussed risk of headache, infection, bleeding, nerve injury and failed or incomplete block.  Patient voices understanding and wishes to proceed.

## 2012-07-01 NOTE — Progress Notes (Signed)
FHR 5 min after AROM 145bpm CHayes, RN

## 2012-07-02 LAB — CBC
MCV: 86.7 fL (ref 78.0–100.0)
Platelets: 220 10*3/uL (ref 150–400)
RDW: 15.4 % (ref 11.5–15.5)
WBC: 8.8 10*3/uL (ref 4.0–10.5)

## 2012-07-02 MED ORDER — IBUPROFEN 600 MG PO TABS: 600.0000 mg | ORAL_TABLET | Freq: Four times a day (QID) | ORAL | Status: AC

## 2012-07-02 NOTE — Progress Notes (Signed)
Post Partum Day 1 Subjective: no complaints, up ad lib and tolerating PO Requests early d/c  Objective: Blood pressure 106/73, pulse 90, temperature 97.7 F (36.5 C), temperature source Oral, resp. rate 18, SpO2 96.00%, unknown if currently breastfeeding.  Physical Exam:  General: alert and cooperative Lochia: appropriate Uterine Fundus: firm    Basename 07/02/12 0535 06/30/12 2315  HGB 9.8* 11.6*  HCT 30.0* 34.9*    Assessment/Plan: Discharge home Motrin F/u 6 weeks for pp exam   LOS: 2 days   Teresa Bartlett W 07/02/2012, 8:37 AM

## 2012-07-02 NOTE — Discharge Summary (Signed)
Obstetric Discharge Summary Reason for Admission: onset of labor Prenatal Procedures: none Intrapartum Procedures: spontaneous vaginal delivery Postpartum Procedures: none Complications-Operative and Postpartum: first degree perineal laceration Hemoglobin  Date Value Range Status  07/02/2012 9.8* 12.0 - 15.0 g/dL Final     HCT  Date Value Range Status  07/02/2012 30.0* 36.0 - 46.0 % Final    Physical Exam:  General: alert and cooperative Lochia: appropriate Uterine Fundus: firm   Discharge Diagnoses: Term Pregnancy-delivered  Discharge Information: Date: 07/02/2012 Activity: pelvic rest Diet: routine Medications: Ibuprofen Condition: improved Instructions: refer to practice specific booklet Discharge to: home Follow-up Information    Follow up with [name], MD. Schedule an appointment as soon as possible for a visit in 6 weeks.   Contact information:   [address], [address] [place] [address] [phone]          Newborn Data: Live born female  Birth Weight: 7 lb 10.1 oz (3460 g) APGAR: 8, 9  Home with mother.  [name] 07/02/2012, 8:41 AM

## 2012-07-07 ENCOUNTER — Inpatient Hospital Stay (HOSPITAL_COMMUNITY): Admission: RE | Admit: 2012-07-07 | Payer: Medicaid Other | Source: Ambulatory Visit

## 2012-08-28 ENCOUNTER — Encounter (HOSPITAL_COMMUNITY): Payer: Self-pay

## 2012-09-11 ENCOUNTER — Encounter (HOSPITAL_COMMUNITY)
Admission: RE | Admit: 2012-09-11 | Discharge: 2012-09-11 | Disposition: A | Discharge status: Home / Self Care | Payer: Medicaid Other | Source: Ambulatory Visit | Attending: Obstetrics and Gynecology | Admitting: Obstetrics and Gynecology

## 2012-09-11 ENCOUNTER — Encounter (HOSPITAL_COMMUNITY): Payer: Self-pay

## 2012-09-11 LAB — CBC
HCT: 39.5 % (ref 36.0–46.0)
MCV: 86.4 fL (ref 78.0–100.0)
Platelets: 310 10*3/uL (ref 150–400)
RBC: 4.57 MIL/uL (ref 3.87–5.11)
WBC: 8.1 10*3/uL (ref 4.0–10.5)

## 2012-09-11 NOTE — Patient Instructions (Addendum)
Your procedure is scheduled on:09/16/12  Enter through the Main Entrance at :6am Pick up desk phone and dial [phone] and inform us of your arrival.  Please call [phone] if you have any problems the morning of surgery.  Remember: Do not eat after midnight:Monday Do not drink after:midnight Monday  Take these meds the morning of surgery with a sip of water:none  DO NOT wear jewelry, eye make-up, lipstick,body lotion, or dark fingernail polish. Do not shave for 48 hours prior to surgery.  Patients discharged on the day of surgery will not be allowed to drive home.   Remember to use your Hibiclens as instructed.

## 2012-09-13 LAB — MRSA CULTURE

## 2012-09-15 NOTE — H&P (Signed)
Teresa Bartlett is an 22 y.o. female. G2P2 who presents with request for permanent sterilization.  The patient recently delivered her second child and she and her husband have thoroughly weighed all options and do not want any future pregnancies.  In fact, he will likely get a vasectomy as well.  The patient was counseled extensively on regret, and she is confident that she is 100% sure about her decision.   Pertinent Gynecological History: none Menstrual History:  No LMP recorded.    Past Medical History  Diagnosis Date  . Hip dysplasia   . Sacroiliac joint dysfunction of both sides   . IBS (irritable bowel syndrome)   . Normal pregnancy 06/15/2012  . Depression   . Anxiety   . Asthma     "very mild"  . Complication of anesthesia     convulsion with epidural redose  . IDA (iron deficiency anemia)     Past Surgical History  Procedure Date  . No past surgeries     Family History  Problem Relation Age of Onset  . Diabetes Mother   . Arthritis Mother   . Kidney disease Mother   . Mental illness Mother   . Fibromyalgia Mother   . Heart disease Father   . Diabetes Father   . Hypertension Father   . Hyperlipidemia Father   . Mental illness Father     ptsd and bipolar  . Heart attack Father   . Depression Father   . Heart disease Paternal Uncle   . Diabetes Maternal Grandmother   . Arthritis Maternal Grandmother   . Cancer Paternal Grandmother     colon  . Hyperlipidemia Paternal Grandmother   . Hypertension Paternal Grandmother   . Diabetes Paternal Grandfather   . Hyperlipidemia Paternal Grandfather   . Hypertension Paternal Grandfather   . Mental illness Paternal Grandfather   . Heart disease Paternal Grandfather   . Other Neg Hx     Social History:  reports that she quit smoking about 4 years ago. Her smoking use included Cigarettes. She quit after 2 years of use. She has never used smokeless tobacco. She reports that she drinks alcohol. She reports that she  does not use illicit drugs.  Allergies:  Allergies  Allergen Reactions  . Other Hives    Glue used for EKG leads, other adhesives    No prescriptions prior to admission    ROS  Height 5\' 4"  (1.626 m), weight 113.399 kg (250 lb). Physical Exam  Constitutional: She is oriented to person, place, and time. She appears well-developed and well-nourished.  Cardiovascular: Normal rate and regular rhythm.   Respiratory: Effort normal and breath sounds normal.  GI: Soft. Bowel sounds are normal.  Genitourinary: Vagina normal and uterus normal.  Musculoskeletal: Normal range of motion.  Neurological: She is alert and oriented to person, place, and time.  Psychiatric: Her behavior is normal.    No results found for this or any previous visit (from the past 24 hour(s)).  No results found.  Assessment/Plan: The patient was counseled re: the procedure in detail.  Surgical risks of bleeding infection and possible damage to bowel and bladder were reviewed.  We also discussed a 1/100 chance of failure as well as an increased risk of ectopic should pregnancy occur.  Pt desires to proceed.  She declined an Essure procedure.  [name] 09/15/2012, 7:58 AM

## 2012-09-16 ENCOUNTER — Encounter (HOSPITAL_COMMUNITY): Payer: Self-pay | Admitting: *Deleted

## 2012-09-16 ENCOUNTER — Ambulatory Visit (HOSPITAL_COMMUNITY)
Admission: RE | Admit: 2012-09-16 | Discharge: 2012-09-16 | Disposition: A | Discharge status: Home / Self Care | Payer: Medicaid Other | Source: Ambulatory Visit | Attending: Obstetrics and Gynecology | Admitting: Obstetrics and Gynecology

## 2012-09-16 ENCOUNTER — Ambulatory Visit (HOSPITAL_COMMUNITY): Payer: Medicaid Other | Admitting: Anesthesiology

## 2012-09-16 ENCOUNTER — Encounter (HOSPITAL_COMMUNITY)
Admission: RE | Disposition: A | Discharge status: Home / Self Care | Payer: Self-pay | Source: Ambulatory Visit | Attending: Obstetrics and Gynecology

## 2012-09-16 ENCOUNTER — Encounter (HOSPITAL_COMMUNITY): Payer: Self-pay | Admitting: Anesthesiology

## 2012-09-16 DIAGNOSIS — Z9851 Tubal ligation status: Secondary | ICD-10-CM

## 2012-09-16 DIAGNOSIS — Z01818 Encounter for other preprocedural examination: Secondary | ICD-10-CM | POA: Insufficient documentation

## 2012-09-16 DIAGNOSIS — Z01812 Encounter for preprocedural laboratory examination: Secondary | ICD-10-CM | POA: Insufficient documentation

## 2012-09-16 DIAGNOSIS — Z302 Encounter for sterilization: Secondary | ICD-10-CM | POA: Insufficient documentation

## 2012-09-16 HISTORY — PX: LAPAROSCOPIC TUBAL LIGATION: SHX1937

## 2012-09-16 SURGERY — LIGATION, FALLOPIAN TUBE, LAPAROSCOPIC
Anesthesia: General | Site: Abdomen | Laterality: Bilateral | Wound class: Clean Contaminated

## 2012-09-16 MED ORDER — ROCURONIUM BROMIDE 50 MG/5ML IV SOLN
INTRAVENOUS | Status: AC
  Filled 2012-09-16: qty 1

## 2012-09-16 MED ORDER — NEOSTIGMINE METHYLSULFATE 1 MG/ML IJ SOLN
INTRAMUSCULAR | Status: DC | PRN
  Administered 2012-09-16: 4 mg via INTRAVENOUS

## 2012-09-16 MED ORDER — PROPOFOL 10 MG/ML IV EMUL
INTRAVENOUS | Status: AC
  Filled 2012-09-16: qty 20

## 2012-09-16 MED ORDER — MIDAZOLAM HCL 5 MG/5ML IJ SOLN
INTRAMUSCULAR | Status: DC | PRN
  Administered 2012-09-16: 2 mg via INTRAVENOUS

## 2012-09-16 MED ORDER — MIDAZOLAM HCL 2 MG/2ML IJ SOLN: 0.5000 mg | Freq: Once | INTRAMUSCULAR | Status: DC | PRN

## 2012-09-16 MED ORDER — MEPERIDINE HCL 25 MG/ML IJ SOLN: 6.2500 mg | INTRAMUSCULAR | Status: DC | PRN

## 2012-09-16 MED ORDER — DEXAMETHASONE SODIUM PHOSPHATE 10 MG/ML IJ SOLN
INTRAMUSCULAR | Status: AC
  Filled 2012-09-16: qty 1

## 2012-09-16 MED ORDER — SODIUM CHLORIDE 0.9 % IJ SOLN
INTRAMUSCULAR | Status: DC | PRN
  Administered 2012-09-16: 10 mL

## 2012-09-16 MED ORDER — KETOROLAC TROMETHAMINE 30 MG/ML IJ SOLN
15.0000 mg | Freq: Once | INTRAMUSCULAR | Status: AC | PRN
  Administered 2012-09-16: 30 mg via INTRAVENOUS

## 2012-09-16 MED ORDER — LACTATED RINGERS IV SOLN
INTRAVENOUS | Status: DC
  Administered 2012-09-16 (×2): via INTRAVENOUS

## 2012-09-16 MED ORDER — PROPOFOL 10 MG/ML IV EMUL
INTRAVENOUS | Status: DC | PRN
  Administered 2012-09-16: 200 mg via INTRAVENOUS

## 2012-09-16 MED ORDER — DEXAMETHASONE SODIUM PHOSPHATE 4 MG/ML IJ SOLN
INTRAMUSCULAR | Status: DC | PRN
  Administered 2012-09-16: 10 mg via INTRAVENOUS

## 2012-09-16 MED ORDER — LIDOCAINE HCL (CARDIAC) 20 MG/ML IV SOLN
INTRAVENOUS | Status: DC | PRN
  Administered 2012-09-16 (×2): 30 mg via INTRAVENOUS

## 2012-09-16 MED ORDER — ROCURONIUM BROMIDE 100 MG/10ML IV SOLN
INTRAVENOUS | Status: DC | PRN
  Administered 2012-09-16: 20 mg via INTRAVENOUS

## 2012-09-16 MED ORDER — GLYCOPYRROLATE 0.2 MG/ML IJ SOLN
INTRAMUSCULAR | Status: AC
  Filled 2012-09-16: qty 1

## 2012-09-16 MED ORDER — MIDAZOLAM HCL 2 MG/2ML IJ SOLN
INTRAMUSCULAR | Status: AC
  Filled 2012-09-16: qty 2

## 2012-09-16 MED ORDER — PROMETHAZINE HCL 25 MG/ML IJ SOLN: 6.2500 mg | INTRAMUSCULAR | Status: DC | PRN

## 2012-09-16 MED ORDER — OXYCODONE-ACETAMINOPHEN 5-325 MG PO TABS: 1.0000 | ORAL_TABLET | ORAL | Status: DC | PRN

## 2012-09-16 MED ORDER — BUPIVACAINE HCL (PF) 0.25 % IJ SOLN
INTRAMUSCULAR | Status: DC | PRN
  Administered 2012-09-16: 10 mL

## 2012-09-16 MED ORDER — FENTANYL CITRATE 0.05 MG/ML IJ SOLN
INTRAMUSCULAR | Status: DC | PRN
  Administered 2012-09-16 (×3): 50 ug via INTRAVENOUS

## 2012-09-16 MED ORDER — ONDANSETRON HCL 4 MG/2ML IJ SOLN
INTRAMUSCULAR | Status: AC
  Filled 2012-09-16: qty 2

## 2012-09-16 MED ORDER — LACTATED RINGERS IV SOLN: INTRAVENOUS | Status: DC

## 2012-09-16 MED ORDER — LACTATED RINGERS IV SOLN
INTRAVENOUS | Status: DC
  Administered 2012-09-16: 07:00:00 via INTRAVENOUS

## 2012-09-16 MED ORDER — GLYCOPYRROLATE 0.2 MG/ML IJ SOLN
INTRAMUSCULAR | Status: DC | PRN
  Administered 2012-09-16: 0.4 mg via INTRAVENOUS

## 2012-09-16 MED ORDER — LIDOCAINE HCL (CARDIAC) 20 MG/ML IV SOLN
INTRAVENOUS | Status: AC
  Filled 2012-09-16: qty 5

## 2012-09-16 MED ORDER — FENTANYL CITRATE 0.05 MG/ML IJ SOLN: 25.0000 ug | INTRAMUSCULAR | Status: DC | PRN

## 2012-09-16 MED ORDER — KETOROLAC TROMETHAMINE 30 MG/ML IJ SOLN
INTRAMUSCULAR | Status: AC
  Filled 2012-09-16: qty 1

## 2012-09-16 MED ORDER — BUPIVACAINE HCL (PF) 0.25 % IJ SOLN
INTRAMUSCULAR | Status: AC
  Filled 2012-09-16: qty 30

## 2012-09-16 MED ORDER — ONDANSETRON HCL 4 MG/2ML IJ SOLN
INTRAMUSCULAR | Status: DC | PRN
  Administered 2012-09-16: 4 mg via INTRAVENOUS

## 2012-09-16 MED ORDER — FENTANYL CITRATE 0.05 MG/ML IJ SOLN
INTRAMUSCULAR | Status: AC
  Filled 2012-09-16: qty 5

## 2012-09-16 MED ORDER — NEOSTIGMINE METHYLSULFATE 1 MG/ML IJ SOLN
INTRAMUSCULAR | Status: AC
  Filled 2012-09-16: qty 10

## 2012-09-16 SURGICAL SUPPLY — 17 items
CATH ROBINSON RED A/P 16FR (CATHETERS) ×2 IMPLANT
CHLORAPREP W/TINT 26ML (MISCELLANEOUS) ×2 IMPLANT
CLOTH BEACON ORANGE TIMEOUT ST (SAFETY) ×2 IMPLANT
DERMABOND ADVANCED (GAUZE/BANDAGES/DRESSINGS) ×1
DERMABOND ADVANCED .7 DNX12 (GAUZE/BANDAGES/DRESSINGS) ×1 IMPLANT
GLOVE BIO SURGEON STRL SZ 6.5 (GLOVE) ×4 IMPLANT
GOWN PREVENTION PLUS LG XLONG (DISPOSABLE) ×4 IMPLANT
NEEDLE INSUFFLATION 14GA 120MM (NEEDLE) ×2 IMPLANT
PACK LAPAROSCOPY BASIN (CUSTOM PROCEDURE TRAY) ×2 IMPLANT
SUT VIC AB 3-0 PS2 18 (SUTURE)
SUT VIC AB 3-0 PS2 18XBRD (SUTURE) IMPLANT
SUT VICRYL 0 UR6 27IN ABS (SUTURE) ×2 IMPLANT
TOWEL OR 17X24 6PK STRL BLUE (TOWEL DISPOSABLE) ×4 IMPLANT
TROCAR Z-THREAD FIOS 11X100 BL (TROCAR) ×2 IMPLANT
TROCAR Z-THREAD FIOS 5X100MM (TROCAR) IMPLANT
WARMER LAPAROSCOPE (MISCELLANEOUS) ×2 IMPLANT
WATER STERILE IRR 1000ML POUR (IV SOLUTION) ×2 IMPLANT

## 2012-09-16 NOTE — Brief Op Note (Signed)
09/16/2012  8:13 AM  PATIENT:  Teresa Bartlett  22 y.o. female  PRE-OPERATIVE DIAGNOSIS:  desires sterility  POST-OPERATIVE DIAGNOSIS:  desires sterility  PROCEDURE:  Procedure(s) (LRB) with comments: LAPAROSCOPIC TUBAL LIGATION (Bilateral)  SURGEON:  Surgeon(s) and Role:    * [name], MD - Primary   ANESTHESIA:   local and general  EBL:  Total I/O In: 1200 [I.V.:1200] Out: 60 [Urine:50; Blood:10]  BLOOD ADMINISTERED:none  DRAINS: none   LOCAL MEDICATIONS USED:  MARCAINE     SPECIMEN:  No Specimen  DISPOSITION OF SPECIMEN:  n/a  COUNTS:  YES  TOURNIQUET:  * No tourniquets in log *  DICTATION: .Dragon Dictation  PLAN OF CARE: Discharge to home after PACU  PATIENT DISPOSITION:  PACU - hemodynamically stable.

## 2012-09-16 NOTE — Transfer of Care (Signed)
Immediate Anesthesia Transfer of Care Note  Patient: Teresa Bartlett  Procedure(s) Performed: Procedure(s) (LRB) with comments: LAPAROSCOPIC TUBAL LIGATION (Bilateral)  Patient Location: PACU  Anesthesia Type: General  Level of Consciousness: awake, patient cooperative and responds to stimulation  Airway & Oxygen Therapy: Patient Spontanous Breathing and Patient connected to nasal cannula oxygen  Post-op Assessment: Report given to PACU RN and Post -op Vital signs reviewed and stable  Post vital signs: Reviewed and stable  Complications: No apparent anesthesia complications

## 2012-09-16 NOTE — Op Note (Signed)
Operative note  Preoperative diagnosis Desires permanent sterility  Postoperative diagnosis Same  Procedure Laparoscopic bilateral tubal fulguration  Surgeon Dr. Huel Cote  Anesthesia Gen.  Estimated blood loss Minimal  Urine output Approximately 50 cc straight catheter prior procedure IV fluids 800 cc LR  Specimen None  Findings There is normal female pelvic anatomy noted the tubes and ovaries appeared normal the uterus itself appeared normal the cul-de-sac was clear of any lesions. The liver edge appeared smooth and normal in appearance.  Procedure Patient was taken to the operating room after informed consent was obtained. General anesthesia was obtained without difficulty and the patient was prepped and draped in the normal sterile fashion in the dorsal lithotomy position. An appropriate time out was performed and a Hulka tenaculum was placed within the cervix and attached the anterior lip for uterine manipulation. Attention was then turned to the patient's abdomen where an infraumbilical area was injected with 10 cc of quarter percent Marcaine. An infraumbilical incision was then made with the scalpel approximately 1 cm in length. The varies needle was then introduced into the peritoneal cavity and intraperitoneal placement confirmed aspiration injection of normal saline. Gas flow was applied and approximately 3 and half liters of CO2 gas instilled. The varies needle was then removed and the 11 trocar introduced into the incision. And the operating scope was then placed within the trocar and the anatomy as previously described was noted. The tubes were relatively visualized on either side and the fimbriated end clearly seen. A 2-3 cm segment of tube was then cauterized with Kleppinger cautery and good blanching noted on the right. In a similar fashion the left fallopian tube was cauterized over a 2-3 cm segment and good blanching noted as well. At the conclusion of the  procedure there is no bleeding noted and the patient was flattened with her pneumoperitoneum reduced through the trocar. The camera and trocar were then removed from the abdomen. A deep suture of 0 Vicryl was placed in the subcutaneous tissue. The subcuticular tissue was then closed with 3-0 Vicryl in a subcuticular stitch and Dermabond. All instruments and sponge counts were correct. The Hulka tenaculum was removed from the vagina and the patient was taken to the recovery room in good condition.

## 2012-09-16 NOTE — Anesthesia Postprocedure Evaluation (Signed)
  Anesthesia Post-op Note  Patient: Teresa Bartlett  Procedure(s) Performed: Procedure(s) (LRB) with comments: LAPAROSCOPIC TUBAL LIGATION (Bilateral)  Patient Location: PACU  Anesthesia Type: General  Level of Consciousness: awake, alert  and oriented  Airway and Oxygen Therapy: Patient Spontanous Breathing  Post-op Pain: none  Post-op Assessment: Post-op Vital signs reviewed, Patient's Cardiovascular Status Stable, Respiratory Function Stable, Patent Airway, No signs of Nausea or vomiting and Pain level controlled  Post-op Vital Signs: Reviewed and stable  Complications: No apparent anesthesia complications

## 2012-09-16 NOTE — Anesthesia Preprocedure Evaluation (Addendum)
Anesthesia Evaluation  Patient identified by MRN, date of birth, ID band Patient awake    Reviewed: Allergy & Precautions, H&P , Patient's Chart, lab work & pertinent test results, reviewed documented beta blocker date and time   Airway Mallampati: III TM Distance: >3 FB Neck ROM: full    Dental No notable dental hx.    Pulmonary neg pulmonary ROS, asthma ,  breath sounds clear to auscultation  Pulmonary exam normal       Cardiovascular Exercise Tolerance: Good negative cardio ROS  Rhythm:regular Rate:Normal     Neuro/Psych PSYCHIATRIC DISORDERS negative neurological ROS  negative psych ROS   GI/Hepatic negative GI ROS, Neg liver ROS,   Endo/Other  negative endocrine ROSMorbid obesity  Renal/GU negative Renal ROS     Musculoskeletal   Abdominal   Peds  Hematology negative hematology ROS (+)   Anesthesia Other Findings Hip dysplasia     Sacroiliac joint dysfunction of both sides        IBS (irritable bowel syndrome)     Normal pregnancy 06/15/2012      Depression     Anxiety        Asthma   "very mild" Complication of anesthesia   convulsion with epidural redose    IDA (iron deficiency anemia)      Reproductive/Obstetrics negative OB ROS                           Anesthesia Physical Anesthesia Plan  ASA: III  Anesthesia Plan: General ETT   Post-op Pain Management:    Induction:   Airway Management Planned:   Additional Equipment:   Intra-op Plan:   Post-operative Plan:   Informed Consent: I have reviewed the patients History and Physical, chart, labs and discussed the procedure including the risks, benefits and alternatives for the proposed anesthesia with the patient or authorized representative who has indicated his/her understanding and acceptance.   Dental Advisory Given  Plan Discussed with: CRNA and Surgeon  Anesthesia Plan Comments:         Anesthesia Quick  Evaluation

## 2012-09-16 NOTE — Progress Notes (Signed)
Patient ID: Teresa Bartlett, female   DOB: 1990/01/24, 24 y.o.   MRN: [id number] Per pt no changes in dictated H&P and brief exam WNL.  Pt ready to proceed.

## 2012-09-17 ENCOUNTER — Encounter (HOSPITAL_COMMUNITY): Payer: Self-pay | Admitting: Obstetrics and Gynecology

## 2012-09-27 ENCOUNTER — Encounter (HOSPITAL_COMMUNITY): Payer: Self-pay | Admitting: Obstetrics and Gynecology

## 2012-09-27 ENCOUNTER — Inpatient Hospital Stay (HOSPITAL_COMMUNITY)
Admission: AD | Admit: 2012-09-27 | Discharge: 2012-09-27 | Disposition: A | Discharge status: Home / Self Care | Payer: Medicaid Other | Source: Ambulatory Visit | Attending: Obstetrics and Gynecology | Admitting: Obstetrics and Gynecology

## 2012-09-27 DIAGNOSIS — L7682 Other postprocedural complications of skin and subcutaneous tissue: Secondary | ICD-10-CM

## 2012-09-27 DIAGNOSIS — R209 Unspecified disturbances of skin sensation: Secondary | ICD-10-CM

## 2012-09-27 DIAGNOSIS — IMO0002 Reserved for concepts with insufficient information to code with codable children: Secondary | ICD-10-CM | POA: Insufficient documentation

## 2012-09-27 NOTE — MAU Note (Signed)
Patient states she had a BTL on 7-17 after delivering on 7-1. States she thinks she scratched the umbilical scar and is not tender and draining.

## 2012-09-27 NOTE — MAU Note (Addendum)
"  I scratched my incision (in the belly button) Tuesday night and it became pink.  It became irritated, pink and sore.  It is more painful now and oozing green discharge.  I immediately called the doctor's office this morning.  The area is a pinpoint in size below the belly button where they may have put some of the needles.  I can also feel a hardened place on the site."

## 2012-09-27 NOTE — MAU Provider Note (Signed)
History     CSN: [id number]  Arrival date and time: 09/27/12 4098   First Provider Initiated Contact with Patient 09/27/12 0930      Chief Complaint  Patient presents with  . Wound Check   HPI Teresa Bartlett 22 y.o. Client had a BTL on 09-16-12.  Has follow up appointment on Monday in the office.  Tuesday night during her sleep, she scratched the incision.  Has had some drainage from the incision and has been worried about infection.  Area is more red now than it has been.  Came today for evaluation.  LMP 08-18-12.  OB History    Grav Para Term Preterm Abortions TAB SAB Ect Mult Living   2 2 2  0 0 0 0 0 0 2      Past Medical History  Diagnosis Date  . Hip dysplasia   . Sacroiliac joint dysfunction of both sides   . IBS (irritable bowel syndrome)   . Normal pregnancy 06/15/2012  . Depression   . Anxiety   . Asthma     "very mild"  . Complication of anesthesia     convulsion with epidural redose  . IDA (iron deficiency anemia)     Past Surgical History  Procedure Date  . No past surgeries   . Laparoscopic tubal ligation 09/16/2012    Procedure: LAPAROSCOPIC TUBAL LIGATION;  Surgeon: [name], MD;  Location: [facility];  Service: Gynecology;  Laterality: Bilateral;    Family History  Problem Relation Age of Onset  . Diabetes Mother   . Arthritis Mother   . Kidney disease Mother   . Mental illness Mother   . Fibromyalgia Mother   . Heart disease Father   . Diabetes Father   . Hypertension Father   . Hyperlipidemia Father   . Mental illness Father     ptsd and bipolar  . Heart attack Father   . Depression Father   . Heart disease Paternal Uncle   . Diabetes Maternal Grandmother   . Arthritis Maternal Grandmother   . Cancer Paternal Grandmother     colon  . Hyperlipidemia Paternal Grandmother   . Hypertension Paternal Grandmother   . Diabetes Paternal Grandfather   . Hyperlipidemia Paternal Grandfather   . Hypertension Paternal Grandfather   .  Mental illness Paternal Grandfather   . Heart disease Paternal Grandfather   . Other Neg Hx     History  Substance Use Topics  . Smoking status: Former Smoker -- 2 years    Types: Cigarettes    Quit date: 06/05/2008  . Smokeless tobacco: Never Used  . Alcohol Use: Yes     occasional when not pregnanct    Allergies:  Allergies  Allergen Reactions  . Other Hives    Glue used for EKG leads, other adhesives    Prescriptions prior to admission  Medication Sig Dispense Refill  . ALBUTEROL IN Inhale 2 puffs into the lungs daily as needed. For shortness of breath.      . ALPRAZolam (XANAX) 0.5 MG tablet Take 0.5 mg by mouth at bedtime as needed. anxiety      . oxyCODONE-acetaminophen (ROXICET) 5-325 MG per tablet Take 1 tablet by mouth every 4 (four) hours as needed for pain.  30 tablet  0    Review of Systems  Constitutional: Negative for fever.  Gastrointestinal: Negative for nausea and vomiting.       Umbilical incision is red and draining  Genitourinary: Negative for dysuria.  Physical Exam   Blood pressure 104/67, pulse 84, temperature 98.1 F (36.7 C), temperature source Oral, resp. rate 20, last menstrual period 08/18/2012, unknown if currently breastfeeding.  Physical Exam  Nursing note and vitals reviewed. Constitutional: She is oriented to person, place, and time. She appears well-developed and well-nourished. No distress.  HENT:  Head: Normocephalic.  Eyes: EOM are normal.  Neck: Neck supple.  GI: Soft. There is no tenderness. There is no rebound and no guarding.       Umbilical incision is pink from 10-2 oclock and slightly swollen.  Crusting seen, but no active drainage even with palpation.  Abdomen is soft, no masses palpated.  Client states it feels like a bee sting with palpation.  No deep redness, no streaking.  Several green bruises seen on abdomen distant from umbilicus.  Musculoskeletal: Normal range of motion.  Neurological: She is alert and oriented to  person, place, and time.  Skin: Skin is warm and dry.  Psychiatric: She has a normal mood and affect.    MAU Course  Procedures  MDM 0945 Consult with Dr. Ellyn Hack re: plan of care.  Area is slightly pink and has the appearance of some skin irritation due to sutures.  No cellulitis.  Pain is minimal.  No drainage seen at this time.  Assessment and Plan  Incisional pain  Plan Keep your appointment in the office. Call your doctor if the pain worsens or you see lots more drainage. [name],[name] 09/27/2012, 9:43 AM

## 2013-07-29 ENCOUNTER — Ambulatory Visit (INDEPENDENT_AMBULATORY_CARE_PROVIDER_SITE_OTHER): Payer: BC Managed Care – PPO | Admitting: Family Medicine

## 2013-07-29 ENCOUNTER — Encounter: Payer: Self-pay | Admitting: Family Medicine

## 2013-07-29 VITALS — BP 110/80 | HR 78 | Temp 98.1°F | Ht 64.5 in | Wt 219.4 lb

## 2013-07-29 DIAGNOSIS — S8990XA Unspecified injury of unspecified lower leg, initial encounter: Secondary | ICD-10-CM

## 2013-07-29 DIAGNOSIS — M25519 Pain in unspecified shoulder: Secondary | ICD-10-CM

## 2013-07-29 DIAGNOSIS — Q6589 Other specified congenital deformities of hip: Secondary | ICD-10-CM | POA: Insufficient documentation

## 2013-07-29 DIAGNOSIS — M25511 Pain in right shoulder: Secondary | ICD-10-CM | POA: Insufficient documentation

## 2013-07-29 DIAGNOSIS — S99912A Unspecified injury of left ankle, initial encounter: Secondary | ICD-10-CM | POA: Insufficient documentation

## 2013-07-29 DIAGNOSIS — S99919A Unspecified injury of unspecified ankle, initial encounter: Secondary | ICD-10-CM

## 2013-07-29 MED ORDER — MELOXICAM 15 MG PO TABS: 15.0000 mg | ORAL_TABLET | Freq: Every day | ORAL | Status: DC

## 2013-07-29 NOTE — Patient Instructions (Addendum)
Schedule your complete physical at your convenience Start the Mobic daily for inflammation- take w/ food ICE your ankle- I think that's a bone bruise and should continue to improve w/ time Listen to your body- if it hurts, don't do it! We'll call you with your ortho appt Call with any questions or concerns Welcome!  We're glad to have you!

## 2013-07-29 NOTE — Progress Notes (Signed)
  Subjective:    Patient ID: Teresa Bartlett, female    DOB: [birth year], 23 y.o.   MRN: [id number]  HPI New to establish.  Previous MD- none.  GYN- [facility]  Ankle injury- L ankle, was getting off the Arc trainer when it 'slammed back it hit me in the front of my ankle'.  Initial pain was intense, occurred ~10 days ago.  Has residual bruising.  Now having audible clicking w/ plantar/dorsiflexion.  When on tip toes or inverting/everting ankle feels somewhat unstable.  Pain has improved- now '1 or 2'.  R shoulder pain- sxs started 6-8 months ago.  Difficulty lifting above 90 degrees.  'really stiff, feels crunchy'.  Pain is described as 'intense'.  Will have pain even w/ touching shoulder.  Pain is posterior and radiating forward.  Will at times radiate down arm.  R arm weakness.  Unable to drive w/ R arm b/c position is painful.  R hand dominant.   Review of Systems For ROS see HPI     Objective:   Physical Exam  Vitals reviewed. Constitutional: She is oriented to person, place, and time. She appears well-developed and well-nourished. No distress.  obese  Musculoskeletal: She exhibits no edema.  + TTP over L anterior lower leg just above ankle at site of injury.  No obvious bony deformity.  + bruising.  R shoulder w/ painful abduction/forward flexion >90 degrees.  + impingement signs.  Neurological: She is alert and oriented to person, place, and time. She has normal reflexes. Coordination normal.          Assessment & Plan:

## 2013-08-01 NOTE — Assessment & Plan Note (Signed)
New.  Suspect bone bruise that should heal w/ time.  Start scheduled NSAID.  Ice.  Reviewed supportive care and red flags that should prompt return.  Pt expressed understanding and is in agreement w/ plan.

## 2013-08-01 NOTE — Assessment & Plan Note (Signed)
New to provider.  Will refer to ortho for ongoing management as this will be a lifelong issue for pt.

## 2013-08-01 NOTE — Assessment & Plan Note (Signed)
New.  Refer to ortho due to limited ROM.  Start daily NSAID.  Reviewed supportive care and red flags that should prompt return.  Pt expressed understanding and is in agreement w/ plan.

## 2013-09-15 ENCOUNTER — Ambulatory Visit (INDEPENDENT_AMBULATORY_CARE_PROVIDER_SITE_OTHER): Payer: BC Managed Care – PPO | Admitting: Family Medicine

## 2013-09-15 ENCOUNTER — Encounter: Payer: Self-pay | Admitting: Family Medicine

## 2013-09-15 VITALS — BP 126/84 | HR 55 | Temp 98.1°F | Ht 64.0 in | Wt 216.6 lb

## 2013-09-15 DIAGNOSIS — Z1331 Encounter for screening for depression: Secondary | ICD-10-CM

## 2013-09-15 DIAGNOSIS — F341 Dysthymic disorder: Secondary | ICD-10-CM

## 2013-09-15 DIAGNOSIS — F329 Major depressive disorder, single episode, unspecified: Secondary | ICD-10-CM

## 2013-09-15 DIAGNOSIS — Z23 Encounter for immunization: Secondary | ICD-10-CM

## 2013-09-15 DIAGNOSIS — M25511 Pain in right shoulder: Secondary | ICD-10-CM

## 2013-09-15 DIAGNOSIS — M25519 Pain in unspecified shoulder: Secondary | ICD-10-CM

## 2013-09-15 DIAGNOSIS — Z0000 Encounter for general adult medical examination without abnormal findings: Secondary | ICD-10-CM

## 2013-09-15 LAB — HEPATIC FUNCTION PANEL
ALT: 15 U/L (ref 0–35)
Albumin: 4.1 g/dL (ref 3.5–5.2)
Alkaline Phosphatase: 48 U/L (ref 39–117)
Total Protein: 7.3 g/dL (ref 6.0–8.3)

## 2013-09-15 LAB — BASIC METABOLIC PANEL
CO2: 27 mEq/L (ref 19–32)
Calcium: 8.9 mg/dL (ref 8.4–10.5)
Creatinine, Ser: 0.7 mg/dL (ref 0.4–1.2)
Glucose, Bld: 81 mg/dL (ref 70–99)

## 2013-09-15 LAB — LIPID PANEL
Cholesterol: 141 mg/dL (ref 0–200)
Total CHOL/HDL Ratio: 4
Triglycerides: 78 mg/dL (ref 0.0–149.0)

## 2013-09-15 LAB — CBC WITH DIFFERENTIAL/PLATELET
Eosinophils Relative: 4.7 % (ref 0.0–5.0)
HCT: 37.7 % (ref 36.0–46.0)
Hemoglobin: 12.6 g/dL (ref 12.0–15.0)
Lymphs Abs: 2.3 10*3/uL (ref 0.7–4.0)
Monocytes Relative: 8.4 % (ref 3.0–12.0)
Neutro Abs: 2.5 10*3/uL (ref 1.4–7.7)
RBC: 4.3 Mil/uL (ref 3.87–5.11)
WBC: 5.6 10*3/uL (ref 4.5–10.5)

## 2013-09-15 MED ORDER — ALPRAZOLAM 0.5 MG PO TABS: 0.5000 mg | ORAL_TABLET | Freq: Three times a day (TID) | ORAL | Status: DC | PRN

## 2013-09-15 MED ORDER — BUPROPION HCL ER (XL) 150 MG PO TB24: 150.0000 mg | ORAL_TABLET | Freq: Every day | ORAL | Status: DC

## 2013-09-15 NOTE — Assessment & Plan Note (Signed)
Deteriorated.  Pt now having panic attacks.  Start daily controller med.  Refill provided on xanax.  Controlled substance agreement discussed and signed.  Will follow.

## 2013-09-15 NOTE — Assessment & Plan Note (Signed)
Pt's PE WNL.  UTD on GYN.  Check labs.  Anticipatory guidance provided.  

## 2013-09-15 NOTE — Patient Instructions (Addendum)
Follow up in 1 month to recheck mood Start the Wellbutrin daily Use the xanax as needed We'll call you with your ortho appt We'll notify you of your lab results and make any changes if needed Call with any questions or concerns Happy [birth year] Birthday!

## 2013-09-15 NOTE — Assessment & Plan Note (Signed)
Pt was unsatisfied w/ outcome of visit at [facility].  Will refer to [facility].  Pt expressed understanding and is in agreement w/ plan.

## 2013-09-15 NOTE — Progress Notes (Signed)
  Subjective:    Patient ID: Teresa Bartlett, female    DOB: 10-25-1990, 23 y.o.   MRN: [id number]  HPI CPE- UTD on pap.  R shoulder pain- saw [facility] and had xrays and steroid injxn that did not work.  Would like to see [facility].  Anxiety/depression- taking xanax prn.  dx'd at age 89.  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  Not sleeping well, low motivation, 'i struggle to eat, take care of myself'.   Review of Systems Patient reports no vision/ hearing changes, adenopathy,fever, weight change,  persistant/recurrent hoarseness , swallowing issues, chest pain, palpitations, edema, persistant/recurrent cough, hemoptysis, dyspnea (rest/exertional/paroxysmal nocturnal), gastrointestinal bleeding (melena, rectal bleeding), abdominal pain, significant heartburn, bowel changes, GU symptoms (dysuria, hematuria, incontinence), Gyn symptoms (abnormal  bleeding, pain),  syncope, focal weakness, memory loss, numbness & tingling, skin/hair/nail changes, abnormal bruising or bleeding     Objective:   Physical Exam General Appearance:    Alert, cooperative, no distress, appears stated age  Head:    Normocephalic, without obvious abnormality, atraumatic  Eyes:    PERRL, conjunctiva/corneas clear, EOM's intact, fundi    benign, both eyes  Ears:    Normal TM's and external ear canals, both ears  Nose:   Nares normal, septum midline, mucosa normal, no drainage    or sinus tenderness  Throat:   Lips, mucosa, and tongue normal; teeth and gums normal  Neck:   Supple, symmetrical, trachea midline, no adenopathy;    Thyroid: no enlargement/tenderness/nodules  Back:     Symmetric, no curvature, ROM normal, no CVA tenderness  Lungs:     Clear to auscultation bilaterally, respirations unlabored  Chest Wall:    No tenderness or deformity   Heart:    Regular rate and rhythm, S1 and S2 normal, no murmur, rub   or gallop  Breast Exam:    Deferred to GYN  Abdomen:      Soft, non-tender, bowel sounds active all four quadrants,    no masses, no organomegaly  Genitalia:    Deferred to GYN  Rectal:    Extremities:   Extremities normal, atraumatic, no cyanosis or edema  Pulses:   2+ and symmetric all extremities  Skin:   Skin color, texture, turgor normal, no rashes or lesions  Lymph nodes:   Cervical, supraclavicular, and axillary nodes normal  Neurologic:   CNII-XII intact, normal strength, sensation and reflexes    throughout          Assessment & Plan:

## 2013-09-19 LAB — VITAMIN D 1,25 DIHYDROXY
Vitamin D2 1, 25 (OH)2: <8 pg/mL
Vitamin D3 1, 25 (OH)2: 64 pg/mL

## 2013-09-23 ENCOUNTER — Telehealth: Payer: Self-pay | Admitting: General Practice

## 2013-09-23 MED ORDER — ESCITALOPRAM OXALATE 10 MG PO TABS: 10.0000 mg | ORAL_TABLET | Freq: Every day | ORAL | Status: DC

## 2013-09-23 NOTE — Telephone Encounter (Signed)
buPROPion (WELLBUTRIN XL) 150 MG 24 hr tablet side effects. Medication started on 9/16. Pt states she is having stomach cramps, aura, nightmares, flu like symptoms. States that symptoms have started immediately. Would like to be on an anxiety medication but is afraid of side effects. Please advise.    Pt uses [facility] on [address].

## 2013-09-23 NOTE — Telephone Encounter (Signed)
Stop Wellbutrin and start Lexapro 10mg  daily- take at dinner, w/ food

## 2013-09-23 NOTE — Telephone Encounter (Signed)
Med filled, chart updated, and pt notified.  

## 2013-10-15 ENCOUNTER — Ambulatory Visit: Payer: BC Managed Care – PPO | Admitting: Family Medicine

## 2013-10-26 ENCOUNTER — Encounter: Payer: Self-pay | Admitting: Family Medicine

## 2013-10-26 ENCOUNTER — Ambulatory Visit (INDEPENDENT_AMBULATORY_CARE_PROVIDER_SITE_OTHER): Payer: BC Managed Care – PPO | Admitting: Family Medicine

## 2013-10-26 VITALS — BP 112/80 | HR 75 | Temp 98.6°F | Resp 16 | Wt 211.4 lb

## 2013-10-26 DIAGNOSIS — F341 Dysthymic disorder: Secondary | ICD-10-CM

## 2013-10-26 DIAGNOSIS — F329 Major depressive disorder, single episode, unspecified: Secondary | ICD-10-CM

## 2013-10-26 DIAGNOSIS — G4700 Insomnia, unspecified: Secondary | ICD-10-CM

## 2013-10-26 MED ORDER — TRAZODONE HCL 50 MG PO TABS: 25.0000 mg | ORAL_TABLET | Freq: Every evening | ORAL | Status: DC | PRN

## 2013-10-26 MED ORDER — ESCITALOPRAM OXALATE 10 MG PO TABS: 10.0000 mg | ORAL_TABLET | Freq: Every day | ORAL | Status: DC

## 2013-10-26 NOTE — Assessment & Plan Note (Signed)
Improved.  Continue Lexapro.  Will follow.

## 2013-10-26 NOTE — Assessment & Plan Note (Signed)
New.  Start Trazodone nightly for sleep.  Will follow to ensure improvement.

## 2013-10-26 NOTE — Progress Notes (Signed)
  Subjective:    Patient ID: Teresa Bartlett, female    DOB: 08-27-1990, 23 y.o.   MRN: [id number]  HPI Anxiety/depression- started on Lexapro last month.  'i really like it'.  No side effects.  'it takes the edge off'.  Some increased bruising after steroid injxn.  Not sleeping well.  Waking every 30 min at night.  Feels that mood is better but would be even better if sleep could be improved.   Review of Systems For ROS see HPI     Objective:   Physical Exam  Vitals reviewed. Constitutional: She is oriented to person, place, and time. She appears well-developed and well-nourished. No distress.  Neurological: She is alert and oriented to person, place, and time.  Psychiatric: She has a normal mood and affect. Her behavior is normal. Judgment and thought content normal.          Assessment & Plan:

## 2013-10-26 NOTE — Patient Instructions (Signed)
Follow up in 3 months to recheck insomnia and mood Continue the Lexapro daily Add the Trazodone at night Call with any questions or concerns Hang in there!

## 2013-11-16 ENCOUNTER — Telehealth: Payer: Self-pay | Admitting: *Deleted

## 2013-11-16 ENCOUNTER — Ambulatory Visit (INDEPENDENT_AMBULATORY_CARE_PROVIDER_SITE_OTHER): Payer: BC Managed Care – PPO | Admitting: Family Medicine

## 2013-11-16 ENCOUNTER — Encounter: Payer: Self-pay | Admitting: Family Medicine

## 2013-11-16 ENCOUNTER — Ambulatory Visit (HOSPITAL_BASED_OUTPATIENT_CLINIC_OR_DEPARTMENT_OTHER)
Admission: RE | Admit: 2013-11-16 | Discharge: 2013-11-16 | Disposition: A | Discharge status: Home / Self Care | Payer: BC Managed Care – PPO | Source: Ambulatory Visit | Attending: Family Medicine | Admitting: Family Medicine

## 2013-11-16 VITALS — BP 128/88 | HR 83 | Temp 98.1°F | Resp 16 | Wt 207.2 lb

## 2013-11-16 DIAGNOSIS — M79671 Pain in right foot: Secondary | ICD-10-CM

## 2013-11-16 DIAGNOSIS — M79609 Pain in unspecified limb: Secondary | ICD-10-CM

## 2013-11-16 DIAGNOSIS — M25579 Pain in unspecified ankle and joints of unspecified foot: Secondary | ICD-10-CM | POA: Insufficient documentation

## 2013-11-16 MED ORDER — NAPROXEN 500 MG PO TABS: 500.0000 mg | ORAL_TABLET | Freq: Two times a day (BID) | ORAL | Status: DC

## 2013-11-16 NOTE — Patient Instructions (Signed)
Follow up as needed [facility] Wear the post-op shoe for support and comfort Start the Naproxen twice daily- take w/ food Call with any questions or concerns Hang in there! Happy Thanksgiving!!

## 2013-11-16 NOTE — Progress Notes (Signed)
  Subjective:    Patient ID: Teresa Bartlett, female    DOB: Oct 21, 1990, 23 y.o.   MRN: [id number]  HPI Pre visit review using our clinic review tool, if applicable. No additional management support is needed unless otherwise documented below in the visit note.  Foot pain- walked 7 miles on Wednesday in 5 fingers.  R foot.  Having pain laterally that radiates from bottom of foot straight thru to top.  Difficulty w/ weight bearing.  Has taken ibuprofen w/out relief.  Ice w/out relief.   Review of Systems For ROS see HPI     Objective:   Physical Exam  Vitals reviewed. Constitutional: She appears well-developed and well-nourished. No distress.  Cardiovascular: Intact distal pulses.   Musculoskeletal: She exhibits edema and tenderness (pain over proximal R 5th metatarsal and anterior to R lateral malleolus).  Skin: Skin is warm and dry.          Assessment & Plan:

## 2013-11-16 NOTE — Telephone Encounter (Signed)
Patient called and stated that she is experiencing R foot pain. Patient went on hike a couple of days ago and the next day her R foot was a little swollen. Patients states that she would like to come in to be seen by the provider. Appointment was scheduled. SW

## 2013-11-17 ENCOUNTER — Other Ambulatory Visit: Payer: Self-pay | Admitting: Family Medicine

## 2013-11-17 MED ORDER — TRAZODONE HCL 50 MG PO TABS: ORAL_TABLET | ORAL | Status: DC

## 2013-11-18 NOTE — Assessment & Plan Note (Signed)
New.  + pain and swelling.  Fracture vs sprain.  Get xrays.  Start scheduled NSAIDs.  Ice.  Elevate.  Reviewed supportive care and red flags that should prompt return.  Pt expressed understanding and is in agreement w/ plan.

## 2013-12-02 ENCOUNTER — Encounter: Payer: Self-pay | Admitting: Family Medicine

## 2013-12-04 ENCOUNTER — Ambulatory Visit (INDEPENDENT_AMBULATORY_CARE_PROVIDER_SITE_OTHER): Payer: BC Managed Care – PPO | Admitting: Psychology

## 2013-12-04 DIAGNOSIS — F4323 Adjustment disorder with mixed anxiety and depressed mood: Secondary | ICD-10-CM

## 2013-12-09 ENCOUNTER — Ambulatory Visit (INDEPENDENT_AMBULATORY_CARE_PROVIDER_SITE_OTHER): Payer: BC Managed Care – PPO | Admitting: Psychology

## 2013-12-09 DIAGNOSIS — F4323 Adjustment disorder with mixed anxiety and depressed mood: Secondary | ICD-10-CM

## 2013-12-14 ENCOUNTER — Ambulatory Visit (INDEPENDENT_AMBULATORY_CARE_PROVIDER_SITE_OTHER): Payer: BC Managed Care – PPO | Admitting: Psychology

## 2013-12-14 DIAGNOSIS — F4323 Adjustment disorder with mixed anxiety and depressed mood: Secondary | ICD-10-CM

## 2013-12-21 ENCOUNTER — Ambulatory Visit: Payer: BC Managed Care – PPO | Admitting: Psychology

## 2013-12-21 ENCOUNTER — Ambulatory Visit (INDEPENDENT_AMBULATORY_CARE_PROVIDER_SITE_OTHER): Payer: BC Managed Care – PPO | Admitting: Psychology

## 2013-12-21 DIAGNOSIS — F4323 Adjustment disorder with mixed anxiety and depressed mood: Secondary | ICD-10-CM

## 2013-12-28 ENCOUNTER — Ambulatory Visit: Payer: BC Managed Care – PPO | Admitting: Psychology

## 2014-01-04 ENCOUNTER — Encounter: Payer: Self-pay | Admitting: Family Medicine

## 2014-01-06 MED ORDER — CYCLOBENZAPRINE HCL 10 MG PO TABS: 10.0000 mg | ORAL_TABLET | Freq: Three times a day (TID) | ORAL | Status: DC | PRN

## 2014-01-06 MED ORDER — NAPROXEN 500 MG PO TABS: 500.0000 mg | ORAL_TABLET | Freq: Two times a day (BID) | ORAL | Status: DC

## 2014-01-27 ENCOUNTER — Ambulatory Visit (INDEPENDENT_AMBULATORY_CARE_PROVIDER_SITE_OTHER): Payer: BC Managed Care – PPO | Admitting: Family Medicine

## 2014-01-27 ENCOUNTER — Encounter: Payer: Self-pay | Admitting: Family Medicine

## 2014-01-27 VITALS — BP 124/74 | HR 66 | Temp 98.2°F | Resp 16 | Wt 201.2 lb

## 2014-01-27 DIAGNOSIS — G4700 Insomnia, unspecified: Secondary | ICD-10-CM

## 2014-01-27 DIAGNOSIS — F419 Anxiety disorder, unspecified: Secondary | ICD-10-CM

## 2014-01-27 DIAGNOSIS — F329 Major depressive disorder, single episode, unspecified: Secondary | ICD-10-CM

## 2014-01-27 DIAGNOSIS — F32A Depression, unspecified: Secondary | ICD-10-CM

## 2014-01-27 DIAGNOSIS — F341 Dysthymic disorder: Secondary | ICD-10-CM

## 2014-01-27 MED ORDER — DOXEPIN HCL 6 MG PO TABS: 1.0000 | ORAL_TABLET | Freq: Every day | ORAL | Status: DC

## 2014-01-27 NOTE — Assessment & Plan Note (Signed)
No improvement on Trazodone.  Pt fearful of Ambien due to side effects.  Start Silenor.  Will follow.

## 2014-01-27 NOTE — Assessment & Plan Note (Signed)
Well controlled. No changes. 

## 2014-01-27 NOTE — Progress Notes (Signed)
   Subjective:    Patient ID: Teresa Bartlett, female    DOB: 1990/04/28, 24 y.o.   MRN: [id number]  HPI Anxiety/Depression- chronic problem, 'lexapro is great!  i love it!  i don't want to change a thing'.  Insomina- chronic problem, trazodone not helpful even at 100mg .  Having vivid, traumatic dreams.  Pt reports she develops nerve pain when overtired.  Father took Ambien but was sleep driving, 'abusive'- this medication scares her.  Son has been 'escaping at night' so she fears the idea of being 'dead asleep'.   Review of Systems For ROS see HPI     Objective:   Physical Exam  Vitals reviewed. Constitutional: She is oriented to person, place, and time. She appears well-developed and well-nourished. No distress.  Neurological: She is alert and oriented to person, place, and time.  Skin: Skin is warm and dry.  Psychiatric: She has a normal mood and affect. Her behavior is normal. Thought content normal.          Assessment & Plan:

## 2014-01-27 NOTE — Patient Instructions (Signed)
Follow up in 4-6 weeks to recheck sleep Start the Silenor nightly Call with any questions or concerns Hang in there!!!

## 2014-01-27 NOTE — Progress Notes (Signed)
Pre visit review using our clinic review tool, if applicable. No additional management support is needed unless otherwise documented below in the visit note. 

## 2014-02-10 ENCOUNTER — Telehealth: Payer: Self-pay | Admitting: Family Medicine

## 2014-02-10 NOTE — Telephone Encounter (Signed)
Patient called stating we should have received [facility] [address]. Patient states they have been faxing us for 2 weeks. She would like to know the status of this. CB# [phone]

## 2014-02-11 NOTE — Telephone Encounter (Signed)
PA started, awaiting response. JG//CMA

## 2014-02-19 ENCOUNTER — Encounter: Payer: Self-pay | Admitting: Family Medicine

## 2014-02-19 MED ORDER — ZALEPLON 5 MG PO CAPS: 5.0000 mg | ORAL_CAPSULE | Freq: Every evening | ORAL | Status: DC | PRN

## 2014-02-19 NOTE — Telephone Encounter (Signed)
PA for silenor was denied, papers were on JG's desk. State pt could take alternatives of: Remus Lofflerambien, sonata, or lunesta. Per pt has only been on trazodone in the past.    Please advise.

## 2014-02-19 NOTE — Telephone Encounter (Signed)
Sonata 5 mg #30 1 po qhs prn

## 2014-02-19 NOTE — Telephone Encounter (Signed)
Med filled and pt notified.  

## 2014-02-25 ENCOUNTER — Encounter: Payer: Self-pay | Admitting: Family Medicine

## 2014-02-26 ENCOUNTER — Ambulatory Visit: Payer: BC Managed Care – PPO | Admitting: Family Medicine

## 2014-02-26 MED ORDER — ZALEPLON 10 MG PO CAPS: 10.0000 mg | ORAL_CAPSULE | Freq: Every evening | ORAL | Status: DC | PRN

## 2014-02-26 NOTE — Telephone Encounter (Signed)
Med filled.  

## 2014-03-01 ENCOUNTER — Encounter: Payer: Self-pay | Admitting: Family Medicine

## 2014-03-01 DIAGNOSIS — G4700 Insomnia, unspecified: Secondary | ICD-10-CM

## 2014-03-01 MED ORDER — DOXEPIN HCL 6 MG PO TABS: 1.0000 | ORAL_TABLET | Freq: Every day | ORAL | Status: DC

## 2014-03-01 NOTE — Telephone Encounter (Signed)
Verbal order to change dosage by tabori.

## 2014-03-16 ENCOUNTER — Encounter (HOSPITAL_COMMUNITY): Payer: Self-pay | Admitting: Emergency Medicine

## 2014-03-16 ENCOUNTER — Telehealth: Payer: Self-pay | Admitting: *Deleted

## 2014-03-16 ENCOUNTER — Emergency Department (HOSPITAL_COMMUNITY): Payer: BC Managed Care – PPO

## 2014-03-16 ENCOUNTER — Emergency Department (HOSPITAL_COMMUNITY)
Admission: EM | Admit: 2014-03-16 | Discharge: 2014-03-16 | Disposition: A | Discharge status: Home / Self Care | Payer: BC Managed Care – PPO | Attending: Emergency Medicine | Admitting: Emergency Medicine

## 2014-03-16 DIAGNOSIS — K589 Irritable bowel syndrome without diarrhea: Secondary | ICD-10-CM | POA: Insufficient documentation

## 2014-03-16 DIAGNOSIS — R002 Palpitations: Secondary | ICD-10-CM

## 2014-03-16 DIAGNOSIS — M533 Sacrococcygeal disorders, not elsewhere classified: Secondary | ICD-10-CM | POA: Insufficient documentation

## 2014-03-16 DIAGNOSIS — Z87891 Personal history of nicotine dependence: Secondary | ICD-10-CM | POA: Insufficient documentation

## 2014-03-16 DIAGNOSIS — Q6589 Other specified congenital deformities of hip: Secondary | ICD-10-CM | POA: Insufficient documentation

## 2014-03-16 DIAGNOSIS — D509 Iron deficiency anemia, unspecified: Secondary | ICD-10-CM | POA: Insufficient documentation

## 2014-03-16 DIAGNOSIS — F3289 Other specified depressive episodes: Secondary | ICD-10-CM | POA: Insufficient documentation

## 2014-03-16 DIAGNOSIS — F329 Major depressive disorder, single episode, unspecified: Secondary | ICD-10-CM | POA: Insufficient documentation

## 2014-03-16 DIAGNOSIS — R0789 Other chest pain: Secondary | ICD-10-CM | POA: Insufficient documentation

## 2014-03-16 DIAGNOSIS — R5381 Other malaise: Secondary | ICD-10-CM | POA: Insufficient documentation

## 2014-03-16 DIAGNOSIS — J45909 Unspecified asthma, uncomplicated: Secondary | ICD-10-CM | POA: Insufficient documentation

## 2014-03-16 DIAGNOSIS — Z79899 Other long term (current) drug therapy: Secondary | ICD-10-CM | POA: Insufficient documentation

## 2014-03-16 DIAGNOSIS — R5383 Other fatigue: Secondary | ICD-10-CM

## 2014-03-16 DIAGNOSIS — F411 Generalized anxiety disorder: Secondary | ICD-10-CM | POA: Insufficient documentation

## 2014-03-16 DIAGNOSIS — R42 Dizziness and giddiness: Secondary | ICD-10-CM | POA: Insufficient documentation

## 2014-03-16 LAB — BASIC METABOLIC PANEL
BUN: 15 mg/dL (ref 6–23)
CO2: 26 meq/L (ref 19–32)
Calcium: 9.3 mg/dL (ref 8.4–10.5)
Chloride: 103 mEq/L (ref 96–112)
Creatinine, Ser: 0.6 mg/dL (ref 0.50–1.10)
GFR calc Af Amer: >90 mL/min (ref >90)
GLUCOSE: 97 mg/dL (ref 70–99)
POTASSIUM: 4.3 meq/L (ref 3.7–5.3)
Sodium: 142 mEq/L (ref 137–147)

## 2014-03-16 LAB — CBC
HEMATOCRIT: 36.7 % (ref 36.0–46.0)
HEMOGLOBIN: 12.6 g/dL (ref 12.0–15.0)
MCH: 30.6 pg (ref 26.0–34.0)
MCHC: 34.3 g/dL (ref 30.0–36.0)
MCV: 89.1 fL (ref 78.0–100.0)
Platelets: 263 10*3/uL (ref 150–400)
RBC: 4.12 MIL/uL (ref 3.87–5.11)
RDW: 13 % (ref 11.5–15.5)
WBC: 7.2 10*3/uL (ref 4.0–10.5)

## 2014-03-16 LAB — I-STAT TROPONIN, ED: Troponin i, poc: 0 ng/mL (ref 0.00–0.08)

## 2014-03-16 LAB — D-DIMER, QUANTITATIVE: D-Dimer, Quant: 0.42 ug/mL-FEU (ref 0.00–0.48)

## 2014-03-16 LAB — POC URINE PREG, ED: PREG TEST UR: NEGATIVE

## 2014-03-16 NOTE — ED Provider Notes (Signed)
CSN: [id number]     Arrival date & time 03/16/14  1241 History   First MD Initiated Contact with Patient 03/16/14 1735     Chief Complaint  Patient presents with  . Irregular Heart Beat     (Consider location/radiation/quality/duration/timing/severity/associated sxs/prior Treatment) HPI  .Patient to the ER with complaints of abnormal heart beat over the past 2 days. She describes them as intermittent, irregular, lasting for a second and happening at no particular time. She may have multiple episodes in one hour or only 1 per hour. She denies a history of the same. She describes it as a "strong beat" that makes her chest feel tight and she feels dizzy for a second and a little bit sweaty. She has a PMH of anxiety but has not been on medication for it for a few years. She has a special needs toddler and just moved into a new home two days ago. She denies drinking less fluids, change of diet, drinking more caffeine then normal, she denies feeling as though she is under more stress but admits that she has been having a flair of her IBS (diarrhea). She has her tubes tied and has no hx of palpitations.    Past Medical History  Diagnosis Date  . Hip dysplasia   . Sacroiliac joint dysfunction of both sides   . IBS (irritable bowel syndrome)   . Normal pregnancy 06/15/2012  . Depression   . Anxiety   . Asthma     "very mild"  . Complication of anesthesia     convulsion with epidural redose  . IDA (iron deficiency anemia)    Past Surgical History  Procedure Laterality Date  . No past surgeries    . Laparoscopic tubal ligation  09/16/2012    Procedure: LAPAROSCOPIC TUBAL LIGATION;  Surgeon: Oliver PilaKathy W Richardson, MD;  Location: [facility];  Service: Gynecology;  Laterality: Bilateral;   Family History  Problem Relation Age of Onset  . Diabetes Mother   . Arthritis Mother   . Kidney disease Mother   . Mental illness Mother   . Fibromyalgia Mother   . Heart disease Father   . Diabetes Father   .  Hypertension Father   . Hyperlipidemia Father   . Mental illness Father     ptsd and bipolar  . Heart attack Father   . Depression Father   . Heart disease Paternal Uncle   . Diabetes Maternal Grandmother   . Arthritis Maternal Grandmother   . Cancer Paternal Grandmother     colon  . Hyperlipidemia Paternal Grandmother   . Hypertension Paternal Grandmother   . Diabetes Paternal Grandfather   . Hyperlipidemia Paternal Grandfather   . Hypertension Paternal Grandfather   . Mental illness Paternal Grandfather   . Heart disease Paternal Grandfather   . Other Neg Hx    History  Substance Use Topics  . Smoking status: Former Smoker -- 2 years    Types: Cigarettes    Quit date: 06/05/2008  . Smokeless tobacco: Never Used  . Alcohol Use: Yes     Comment: occasional when not pregnanct   OB History   Grav Para Term Preterm Abortions TAB SAB Ect Mult Living   2 2 2  0 0 0 0 0 0 2     Review of Systems  The patient denies anorexia, fever, weight loss,, vision loss, decreased hearing, hoarseness, chest pain, syncope, dyspnea on exertion, peripheral edema, balance deficits, hemoptysis, abdominal pain, melena, hematochezia, severe indigestion/heartburn, hematuria, incontinence, genital  sores, muscle weakness, suspicious skin lesions, transient blindness, difficulty walking, depression, unusual weight change, abnormal bleeding, enlarged lymph nodes, angioedema, and breast masses.   Allergies  Other  Home Medications   Current Outpatient Rx  Name  Route  Sig  Dispense  Refill  . ALBUTEROL IN   Inhalation   Inhale 2 puffs into the lungs daily as needed. For shortness of breath.         . ALPRAZolam (XANAX) 0.5 MG tablet   Oral   Take 1 tablet (0.5 mg total) by mouth 3 (three) times daily as needed for anxiety. anxiety   45 tablet   1   . cyclobenzaprine (FLEXERIL) 10 MG tablet   Oral   Take 1 tablet (10 mg total) by mouth 3 (three) times daily as needed for muscle spasms.    30 tablet   0    BP 105/61  Pulse 72  Temp(Src) 98.4 F (36.9 C) (Oral)  Resp 20  SpO2 99%  LMP 02/23/2014 Physical Exam  Nursing note and vitals reviewed. Constitutional: She appears well-developed and well-nourished. No distress.  HENT:  Head: Normocephalic and atraumatic.  Eyes: Pupils are equal, round, and reactive to light.  Neck: Normal range of motion. Neck supple.  Cardiovascular: Normal rate, regular rhythm and normal heart sounds.   Pulmonary/Chest: Effort normal. No respiratory distress. She has no wheezes.  Abdominal: Soft.  Neurological: She is alert.  Skin: Skin is warm and dry.    ED Course  Procedures (including critical care time) Labs Review Labs Reviewed  CBC  BASIC METABOLIC PANEL  D-DIMER, QUANTITATIVE  I-STAT TROPOININ, ED  POC URINE PREG, ED   Imaging Review Dg Chest 2 View  03/16/2014   CLINICAL DATA:  chest tightness, palpitations  EXAM: CHEST  2 VIEW  COMPARISON:  DG CHEST 2 VIEW dated 10/22/2008  FINDINGS: The heart size and mediastinal contours are within normal limits. Both lungs are clear. The visualized skeletal structures are unremarkable.  IMPRESSION: No active cardiopulmonary disease.   Electronically Signed   By: Salome Holmes M.D.   On: 03/16/2014 18:37     EKG Interpretation   Date/Time:  Tuesday March 16 2014 18:03:33 EDT Ventricular Rate:  74 PR Interval:  159 QRS Duration: 80 QT Interval:  388 QTC Calculation: 430 R Axis:   53 Text Interpretation:  Sinus rhythm Borderline Q waves in inferior leads No  significant change was found Confirmed by Manus Gunning  MD, STEPHEN [phone]) on  03/16/2014 6:18:34 PM      MDM   Final diagnoses:  Intermittent palpitations    Patient monitored in the ER on cardiac monitored for > 2 hours and did not have any episodes. She has had these intermittent and it is usaully one beat and then goes away until an undetermined period of time. Sounds like PVC's. She has a negative troponin, d-dimer,  two normal EKG's 5 hours apart, has been asymptomatic with no electrolyte imbalances. Will refer to Cardiology for possible halter monitor. She has been advised to return to the ED should these start happening and to take her anxiety medication again, she still has an un expired medication.  Referral to cardiology, will come back to ER if symptoms reoccur  23 y.o.Krystan C Koeller's evaluation in the Emergency Department is complete. It has been determined that no acute conditions requiring further emergency intervention are present at this time. The patient/guardian have been advised of the diagnosis and plan. We have discussed signs and symptoms that  warrant return to the ED, such as changes or worsening in symptoms.  Vital signs are stable at discharge. Filed Vitals:   03/16/14 1915  BP: 105/61  Pulse: 72  Temp:   Resp: 20    Patient/guardian has voiced understanding and agreed to follow-up with the PCP or specialist.     Dorthula Matas, PA-C 03/16/14 2000

## 2014-03-16 NOTE — Discharge Instructions (Signed)
Premature Beats °A premature beat is an extra heartbeat that happens earlier than normal. Premature beats are called premature atrial contractions (PACs) or premature ventricular contractions (PVCs) depending on the area of the heart where they start. °CAUSES  °Premature beats may be brought on by a variety of factors including: °· Emotional stress. °· Lack of sleep. °· Caffeine. °· Asthma medicines. °· Stimulants. °· Herbal teas. °· Dietary supplements. °· Alcohol. °In most cases, premature beats are not dangerous and are not a sign of serious heart disease. Most patients evaluated for premature beats have completely normal heart function. Rarely, premature beats may be a sign of more significant heart problems or medical illness. °SYMPTOMS  °Premature beats may cause palpitations. This means you feel like your heart is skipping a beat or beating harder than usual. Sometimes, slight chest pain occurs with premature beats, lasting only a few seconds. This pain has been described as a "flopping" feeling inside the chest. In many cases, premature beats do not cause any symptoms and they are only detected when an electrocardiography test (EKG) or heart monitoring is performed. °DIAGNOSIS  °Your caregiver may run some tests to evaluate your heart such as an EKG or echocardiography. You may need to wear a portable heart monitor for several days to record the electrical activity of your heart. Blood testing may also be performed to check your electrolytes and thyroid function. °TREATMENT  °Premature beats usually go away with rest. If the problem continues, your caregiver will determine a treatment plan for you.  °HOME CARE INSTRUCTIONS °· Get plenty of rest over the next few days until your symptoms improve. °· Avoid coffee, tea, alcohol, and soda (pop, cola). °· Do not smoke. °SEEK MEDICAL CARE IF: °· Your symptoms continue after 1 to 2 days of rest. °· You have new symptoms, such as chest pain or trouble  breathing. °SEEK IMMEDIATE MEDICAL CARE IF: °· You have severe chest pain or abdominal pain. °· You have pain that radiates into the neck, arm, or jaw. °· You faint or have extreme weakness. °· You have shortness of breath. °· Your heartbeat races for more than 5 seconds. °MAKE SURE YOU: °· Understand these instructions. °· Will watch your condition. °· Will get help right away if you are not doing well or get worse. °Document Released: 01/24/2005 Document Revised: 03/10/2012 Document Reviewed: 08/20/2011 °Exit[employer]® Patient Information ©2014 [employer]. ° °

## 2014-03-16 NOTE — ED Notes (Signed)
Pain between shoulder blades too, did moving yesterday

## 2014-03-16 NOTE — Telephone Encounter (Signed)
Patient called and left message on triage line stating she is having dizziness and heart palpitations. Please return call at [phone]. JG//CMA

## 2014-03-16 NOTE — Telephone Encounter (Signed)
Called and spoke with pt, she was in there ER. States that she has an appt with tabori on Friday. She was waiting for labs to come back.

## 2014-03-16 NOTE — ED Notes (Signed)
Pt is here with heart palpitations that started yesterday and has been consistent since.  Pt reports dizziness, sweatiness when happens.  Pt reports fever and diarrhea on Sunday.  PT states chest feels tight.  LMP 2 weeks ago

## 2014-03-16 NOTE — Telephone Encounter (Signed)
Spoke with patient.  C/o heart palpitations, pain to lower back and between shoulder blades, dizziness, diaphoresis, and dry mouth.  Symptoms started yesterday, but patient has had heart palpitations and shortness of breath before in the past during pregnancies and high levels of stress.  Patient moved this past weekend, and really didn't think that the move was getting to her, but when symptoms started patient became unsure.  However, the symptoms are worse today then they were yesterday.  She denies chest pressure, left arm pain, abdominal pain, jaw pain, nausea, vomiting and indigestion.  Based on patient's symptoms, she was encouraged to go to the ED for precaution.  Patient was in agreement.  She asked that Dr. Beverely Lowabori be made aware.

## 2014-03-16 NOTE — ED Notes (Signed)
Pt c/o extreme tiredness x 2 days, R hand weakness today

## 2014-03-16 NOTE — Telephone Encounter (Signed)
Noted.  Agree w/ advice to be seen in ER

## 2014-03-17 ENCOUNTER — Telehealth: Payer: Self-pay

## 2014-03-17 MED ORDER — MECLIZINE HCL 25 MG PO TABS: 25.0000 mg | ORAL_TABLET | Freq: Three times a day (TID) | ORAL | Status: DC | PRN

## 2014-03-17 NOTE — Telephone Encounter (Signed)
Patient was seen in the ED on 03/16/14 for intermittent heart palpitations and dizziness.  Patient was evaluated, treated and sent home.  See ED encounter for details.  She was advised to follow up with her PCP. She also has a cardiology appointment scheduled for 04/05/14 @ 1045. Spoke with patient, heart palpitations have ceased per patient (however, she did have some palpitations upon waking up this morning), yet she continues to experience the dizziness.  Her symptoms are not any worse than they were yesterday.  Dr. Beverely Lowabori is aware.  Patient was advised to relax, drink plenty of fluids, to keep Friday appointment and appointment with Cardiology on the 6th.   Meclizine prescription was ordered to alleviate dizziness.  Patient was also encouraged to go back to the ED if she experiences the following symptoms:  Chest pain, abdominal pain, pain radiating down neck and arm, extreme weakness, shortness of breath, nausea/vomiting, or diaphoresis.  Patient stated understanding and agreed with plan.      Medication and allergies:  Reviewed and updated [facility] pharmacy[facility]  [id number] - [place], [place] - [address] [facility] [address] No changes to personal, family history or past surgical hx    Follow up appointment scheduled for Friday, March 19, 2014 @ 0930 with Dr. Beverely Lowabori.

## 2014-03-17 NOTE — Telephone Encounter (Signed)
Medication filled.  [facility] [address].  Patient aware.

## 2014-03-17 NOTE — Telephone Encounter (Signed)
Patient called to schedule a hospital follow up also she states that she still feels dizzy. Please advise.

## 2014-03-17 NOTE — ED Provider Notes (Signed)
Medical screening examination/treatment/procedure(s) were performed by non-physician practitioner and as supervising physician I was immediately available for consultation/collaboration.   EKG Interpretation   Date/Time:  Tuesday March 16 2014 18:03:33 EDT Ventricular Rate:  74 PR Interval:  159 QRS Duration: 80 QT Interval:  388 QTC Calculation: 430 R Axis:   53 Text Interpretation:  Sinus rhythm Borderline Q waves in inferior leads No  significant change was found Confirmed by Manus GunningANCOUR  MD, Kloi Brodman [phone]) on  03/16/2014 6:18:34 PM       Glynn OctaveStephen Kemuel Buchmann, MD 03/17/14 [phone]

## 2014-03-17 NOTE — Telephone Encounter (Signed)
Spoke to patient.  Patient stated that symptoms have subsided after taking ibuprofen and laying down.  Patient was encouraged to go to Urgent Care for evaluation.   Patient stated that she did not want to go Urgent Care and would rather wait to be seen in the office.  Follow-up appointment was rescheduled for 3/19 @ 11:30a.  She was advised that if symptoms worsen that patient must go to the ED or UC.  Patient stated understanding and agreed.

## 2014-03-17 NOTE — Telephone Encounter (Signed)
Patient left message on triage line stating that she is having palpitations again along with a terrible migraine. She would like a call back. Please advise. JG//CMA

## 2014-03-18 ENCOUNTER — Encounter: Payer: Self-pay | Admitting: Family Medicine

## 2014-03-18 ENCOUNTER — Ambulatory Visit (INDEPENDENT_AMBULATORY_CARE_PROVIDER_SITE_OTHER): Payer: BC Managed Care – PPO | Admitting: Family Medicine

## 2014-03-18 VITALS — BP 140/92 | HR 88 | Temp 98.2°F | Resp 16 | Wt 203.0 lb

## 2014-03-18 DIAGNOSIS — R002 Palpitations: Secondary | ICD-10-CM

## 2014-03-18 DIAGNOSIS — H8110 Benign paroxysmal vertigo, unspecified ear: Secondary | ICD-10-CM

## 2014-03-18 DIAGNOSIS — G4700 Insomnia, unspecified: Secondary | ICD-10-CM

## 2014-03-18 LAB — TSH: TSH: 0.59 u[IU]/mL (ref 0.35–5.50)

## 2014-03-18 NOTE — Patient Instructions (Signed)
Keep the appointment w/ cardiology as scheduled Continue the meclizine as needed for the dizziness Drink plenty of fluids Change positions slowly Do the Epley Maneuver to desensitize yourself to the vertigo Call with any changes, questions or concerns Hang in there!!

## 2014-03-18 NOTE — Assessment & Plan Note (Signed)
New.  Pt's dizziness appears to be positional vertigo.  Pt has meclizine available.  Reviewed modified eppley maneuver to decrease pt's sensitivity.  Reviewed supportive care and red flags that should prompt return.  Pt expressed understanding and is in agreement w/ plan.

## 2014-03-18 NOTE — Telephone Encounter (Signed)
Referral placed.

## 2014-03-18 NOTE — Progress Notes (Signed)
   Subjective:    Patient ID: Andreas BlowerAlexandria C Fountaine, female    DOB: 06-Apr-1990, 24 y.o.   MRN: [id number]  HPI ER F/U- went to ER on 3/17 for palpitations.  Pt describes it as being 'hit in the chest'.  + dizziness.  Fatigue.  'my head's in a fog'- getting lost while driving.  When turning to quickly she will fall.  Recently moved, admits to some stress but doesn't feel this is enough stress to cause her sxs.  Dizziness described as the room tilting, 'like being drunk'.  sxs are intermittent and in between, 'i feel fine'.  Has appt set up w/ cards to get holter monitor.   Review of Systems For ROS see HPI     Objective:   Physical Exam  Vitals reviewed. Constitutional: She is oriented to person, place, and time. She appears well-developed and well-nourished. No distress.  HENT:  Head: Normocephalic and atraumatic.  Mouth/Throat: Uvula is midline and mucous membranes are normal.  TMs WNL No TTP over sinuses Minimal nasal congestion  Eyes: Conjunctivae and EOM are normal. Pupils are equal, round, and reactive to light.  2-3 beats of horizontal nystagmus  Neck: Normal range of motion. Neck supple.  Cardiovascular: Normal rate, regular rhythm, normal heart sounds and intact distal pulses.   Pulmonary/Chest: Effort normal and breath sounds normal. No respiratory distress. She has no wheezes. She has no rales.  Musculoskeletal: She exhibits no edema.  Lymphadenopathy:    She has no cervical adenopathy.  Neurological: She is alert and oriented to person, place, and time. She has normal reflexes. No cranial nerve deficit.  Skin: Skin is warm and dry.  Psychiatric: She has a normal mood and affect. Her behavior is normal. Judgment and thought content normal.          Assessment & Plan:

## 2014-03-18 NOTE — Assessment & Plan Note (Signed)
New.  Pt's reported palpitations consistent w/ PVCs.  Has appt upcoming w/ cards.  2 hrs of ER monitoring and EKG in office w/o abnormality.  Suspect part of her problem is high stress/anxiety levels.  Check thyroid to r/o abnormality.  Will follow.

## 2014-03-18 NOTE — Progress Notes (Signed)
Pre visit review using our clinic review tool, if applicable. No additional management support is needed unless otherwise documented below in the visit note. 

## 2014-03-19 ENCOUNTER — Ambulatory Visit: Payer: BC Managed Care – PPO | Admitting: Family Medicine

## 2014-03-31 ENCOUNTER — Encounter (HOSPITAL_BASED_OUTPATIENT_CLINIC_OR_DEPARTMENT_OTHER): Payer: BC Managed Care – PPO

## 2014-04-05 ENCOUNTER — Ambulatory Visit: Payer: BC Managed Care – PPO | Admitting: Cardiovascular Disease

## 2014-04-07 ENCOUNTER — Encounter: Payer: Self-pay | Admitting: Family Medicine

## 2014-04-28 ENCOUNTER — Encounter: Payer: Self-pay | Admitting: Cardiovascular Disease

## 2014-04-28 ENCOUNTER — Ambulatory Visit (INDEPENDENT_AMBULATORY_CARE_PROVIDER_SITE_OTHER): Payer: BC Managed Care – PPO | Admitting: Cardiovascular Disease

## 2014-04-28 VITALS — BP 118/80 | HR 74 | Ht 64.0 in | Wt 158.0 lb

## 2014-04-28 DIAGNOSIS — H8110 Benign paroxysmal vertigo, unspecified ear: Secondary | ICD-10-CM

## 2014-04-28 DIAGNOSIS — F419 Anxiety disorder, unspecified: Secondary | ICD-10-CM

## 2014-04-28 DIAGNOSIS — F329 Major depressive disorder, single episode, unspecified: Secondary | ICD-10-CM

## 2014-04-28 DIAGNOSIS — F341 Dysthymic disorder: Secondary | ICD-10-CM

## 2014-04-28 DIAGNOSIS — R002 Palpitations: Secondary | ICD-10-CM

## 2014-04-28 NOTE — Assessment & Plan Note (Signed)
Meclizine had helped but less lately f/u ENT  Consider neuro referral for headaches and migraines

## 2014-04-28 NOTE — Progress Notes (Signed)
Patient ID: Teresa BlowerAlexandria C Tamashiro, female   DOB: [birth year], 24 y.o.   MRN: [id number]  ER F/U- went to ER on 3/17 for palpitations. Pt describes it as being 'hit in the chest'. + dizziness. Fatigue. 'my head's in a fog'- getting lost while driving. When turning to quickly she will fall. Recently moved, admits to some stress but doesn't feel this is enough stress to cause her sxs. Dizziness described as the room tilting, 'like being drunk'. sxs are intermittent and in between, 'i feel fine'. Has appt set up w/ cards to get holter monitor.  Telemetry in ER normal f/u TSH also normal  She has migraines and anxiety Has lost over 150 lbs last few years and works out at [employer] 5-6 x/ week.  When she exercises the palpitations go away  No chest pain No dyspnea Use to have asthma and carries an inhaler but does not use it  No other stimulants  ETOH excess or drugs       ROS: Denies fever, malais, weight loss, blurry vision, decreased visual acuity, cough, sputum, SOB, hemoptysis, pleuritic pain, palpitaitons, heartburn, abdominal pain, melena, lower extremity edema, claudication, or rash.  All other systems reviewed and negative   General: Affect appropriate Healthy:  appears stated age HEENT: normal Neck supple with no adenopathy JVP normal no bruits no thyromegaly Lungs clear with no wheezing and good diaphragmatic motion Heart:  S1/S2 no murmur,rub, gallop or click PMI normal Abdomen: benighn, BS positve, no tenderness, no AAA no bruit.  No HSM or HJR Distal pulses intact with no bruits No edema Neuro non-focal Skin warm and dry No muscular weakness  Medications No current outpatient prescriptions on file.   No current facility-administered medications for this visit.    Allergies Other  Family History: Family History  Problem Relation Age of Onset  . Diabetes Mother   . Arthritis Mother   . Kidney disease Mother   . Mental illness Mother   . Fibromyalgia Mother   . Heart  disease Father   . Diabetes Father   . Hypertension Father   . Hyperlipidemia Father   . Mental illness Father     ptsd and bipolar  . Heart attack Father   . Depression Father   . Heart disease Paternal Uncle   . Diabetes Maternal Grandmother   . Arthritis Maternal Grandmother   . Cancer Paternal Grandmother     colon  . Hyperlipidemia Paternal Grandmother   . Hypertension Paternal Grandmother   . Diabetes Paternal Grandfather   . Hyperlipidemia Paternal Grandfather   . Hypertension Paternal Grandfather   . Mental illness Paternal Grandfather   . Heart disease Paternal Grandfather   . Other Neg Hx     Social History: History   Social History  . Marital Status: Married    Spouse Name: N/A    Number of Children: N/A  . Years of Education: N/A   Occupational History  . Not on file.   Social History Main Topics  . Smoking status: Former Smoker -- 2 years    Types: Cigarettes    Quit date: 06/05/2008  . Smokeless tobacco: Never Used  . Alcohol Use: Yes     Comment: occasional when not pregnanct  . Drug Use: No  . Sexual Activity: No   Other Topics Concern  . Not on file   Social History Narrative  . No narrative on file    Electrocardiogram: SR rate 68 normal   Assessment and Plan

## 2014-04-28 NOTE — Assessment & Plan Note (Signed)
May be related to her palpitations F/U primary

## 2014-04-28 NOTE — Patient Instructions (Signed)
Your physician recommends that you schedule a follow-up appointment in:  AS NEEDED Your physician recommends that you continue on your current medications as directed. Please refer to the Current Medication list given to you today. Your physician has recommended that you wear an event monitor. Event monitors are medical devices that record the heart's electrical activity. Doctors most often us these monitors to diagnose arrhythmias. Arrhythmias are problems with the speed or rhythm of the heartbeat. The monitor is a small, portable device. You can wear one while you do your normal daily activities. This is usually used to diagnose what is causing palpitations/syncope (passing out).

## 2014-04-28 NOTE — Assessment & Plan Note (Signed)
Seem benign With normal ER visit, ECG and exam not likely significant  F/U event monitor since she is getting them daily still

## 2014-04-29 ENCOUNTER — Encounter: Payer: Self-pay | Admitting: *Deleted

## 2014-04-29 ENCOUNTER — Encounter (INDEPENDENT_AMBULATORY_CARE_PROVIDER_SITE_OTHER): Payer: BC Managed Care – PPO

## 2014-04-29 ENCOUNTER — Encounter: Payer: Self-pay | Admitting: Family Medicine

## 2014-04-29 DIAGNOSIS — R42 Dizziness and giddiness: Secondary | ICD-10-CM

## 2014-04-29 DIAGNOSIS — R002 Palpitations: Secondary | ICD-10-CM

## 2014-04-29 NOTE — Telephone Encounter (Signed)
Referral placed.

## 2014-04-29 NOTE — Progress Notes (Signed)
Patient ID: Teresa BlowerAlexandria C Wandell, female   DOB: [birth year], 24 y.o.   MRN: [id number] E-Cardio verite 30 day cardiac event monitor applied to patient.

## 2014-05-05 ENCOUNTER — Telehealth: Payer: Self-pay | Admitting: Cardiovascular Disease

## 2014-05-05 NOTE — Telephone Encounter (Signed)
Patient wearing 30 day event monitor. Spoke with Shelly in monitors.  She gave the patient all 3 types of hyoallergenic electrodes that we have. She has reacted to all. Burnett HarryShelly will provide report of activity to date to Dr. Eden EmmsNishan. Informed patient we will call her back with further plan after Dr. Eden EmmsNishan reviews the ecardio report. Patient verbalizes understanding.

## 2014-05-05 NOTE — Telephone Encounter (Signed)
New message     Pt got monitor last wed-thurs.  She has hives all over---from head to toe---she knew she was allergic to the electrodes but not this bad.  Pt took monitor off 5 minutes ago.  She got a couple of examples today of what she has been feeling. Pt will keep monitor off until she hears from us.

## 2014-05-06 NOTE — Telephone Encounter (Signed)
LMTCB ./CY 

## 2014-05-06 NOTE — Telephone Encounter (Signed)
Follow up  ° ° ° °Returning call back to nurse  °

## 2014-05-06 NOTE — Telephone Encounter (Signed)
Follow up ° ° ° ° °Returning a nurses call °

## 2014-05-07 NOTE — Telephone Encounter (Signed)
SPOKE WITH PT RE MONITOR  THAT REQUIRES   NO  ELECTRODES   PT   DOES NOT HAVE LAND  LINE  WILL RETURN  CURRENT MONITOR  AND   WILL  FORWARD  FEW  TRACINGS  RECEIVED    TO  DR NISHAN./CY

## 2014-05-10 ENCOUNTER — Telehealth: Payer: Self-pay | Admitting: Family Medicine

## 2014-05-10 ENCOUNTER — Telehealth: Payer: Self-pay | Admitting: *Deleted

## 2014-05-10 ENCOUNTER — Ambulatory Visit (INDEPENDENT_AMBULATORY_CARE_PROVIDER_SITE_OTHER): Payer: BC Managed Care – PPO | Admitting: Family Medicine

## 2014-05-10 ENCOUNTER — Encounter: Payer: Self-pay | Admitting: Family Medicine

## 2014-05-10 VITALS — BP 124/84 | HR 80 | Temp 98.2°F | Resp 16 | Wt 204.2 lb

## 2014-05-10 DIAGNOSIS — L259 Unspecified contact dermatitis, unspecified cause: Secondary | ICD-10-CM | POA: Insufficient documentation

## 2014-05-10 MED ORDER — HYDROXYZINE HCL 50 MG PO TABS: 50.0000 mg | ORAL_TABLET | Freq: Three times a day (TID) | ORAL | Status: DC | PRN

## 2014-05-10 MED ORDER — TRIAMCINOLONE ACETONIDE 0.1 % EX OINT: 1.0000 "application " | TOPICAL_OINTMENT | Freq: Two times a day (BID) | CUTANEOUS | Status: DC

## 2014-05-10 MED ORDER — METHYLPREDNISOLONE ACETATE 80 MG/ML IJ SUSP
80.0000 mg | Freq: Once | INTRAMUSCULAR | Status: AC
  Administered 2014-05-10: 80 mg via INTRAMUSCULAR

## 2014-05-10 MED ORDER — PREDNISONE 10 MG PO TABS: ORAL_TABLET | ORAL | Status: DC

## 2014-05-10 NOTE — Telephone Encounter (Signed)
PT  AWARE OF MONITOR  RESULTS  PER  DR NISHAN  NSR NO SIG  ARRHYTHMIAS./CY 

## 2014-05-10 NOTE — Patient Instructions (Signed)
Follow up as needed Start the Prednisone as directed tomorrow morning- take w/ food Use the Hydroxyzine as needed for itching- may cause drowsiness Use the Triamcinolone ointment twice daily on the very itchy spots Call with any questions or concerns Hang in there!!!

## 2014-05-10 NOTE — Progress Notes (Signed)
   Subjective:    Patient ID: Teresa Bartlett, female    DOB: [birth year], 24 y.o.   MRN: [id number]  HPI Skin reaction- pt reports skin rash started when she had to wear leads for holter monitor.  Now has rash on face, chest, trunk, legs.  No relief w/ benadryl, oatmeal baths, benadryl cream.  Very itchy.   Review of Systems For ROS see HPI     Objective:   Physical Exam  Vitals reviewed. Constitutional: She appears well-developed and well-nourished. No distress.  Skin: Skin is warm and dry. Rash (patchy erythematous rash diffusely consistent w/ contact dermatitis) noted. There is erythema.          Assessment & Plan:

## 2014-05-10 NOTE — Progress Notes (Signed)
Pre visit review using our clinic review tool, if applicable. No additional management support is needed unless otherwise documented below in the visit note. 

## 2014-05-10 NOTE — Assessment & Plan Note (Signed)
New.  Depo medrol today due to diffuse distribution.  Start prednisone taper tomorrow.  Hydroxyzine as needed for itching.  Reviewed supportive care and red flags that should prompt return.  Pt expressed understanding and is in agreement w/ plan.

## 2014-05-10 NOTE — Telephone Encounter (Signed)
Caller name: Martinique[place]andria Relation to pt: self  Call back [phone]   Reason for call:  Pt states that had a procedure done on 4/30 at the Cardiologist office and she has had a bad reaction to the gel since then.  She states that she has a hx of allergic reactions to the gel and it usually goes away with Benedryl.  Pt states that it has not gone away and looks worse.  Please advise pt on what to do.

## 2014-05-10 NOTE — Telephone Encounter (Addendum)
C/o:  Patient was ordered to wear a holster monitor on 4/30 by her Cardiologist and after 3 days started to experience an allergic reaction to the adhesive and gel from leads.  Rash has currently spread throughout her body, including her head/scalp, face, eyes, neck, trunk, and legs.   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   Treatments tried:  Oral and topical Benadryl  Despite self-treatment, rash has worsen and patient states "I'm just miserable."  She denies shortness of breath, swelling of eyes, lips, or tongue, and itchy throat.   Advice given:  Appointment scheduled with Dr. Beverely Lowabori at 11:30 am and if symptoms worsen or she becomes short of breath, tongue starts to swell, etc to call EMS.  Patient stated understanding and agreed with plan.

## 2014-05-14 ENCOUNTER — Encounter: Payer: Self-pay | Admitting: Family Medicine

## 2014-05-28 ENCOUNTER — Encounter: Payer: Self-pay | Admitting: Family Medicine

## 2014-05-31 ENCOUNTER — Encounter: Payer: Self-pay | Admitting: Family Medicine

## 2014-05-31 ENCOUNTER — Other Ambulatory Visit: Payer: Self-pay | Admitting: Family Medicine

## 2014-05-31 ENCOUNTER — Other Ambulatory Visit (INDEPENDENT_AMBULATORY_CARE_PROVIDER_SITE_OTHER): Payer: BC Managed Care – PPO

## 2014-05-31 DIAGNOSIS — Z789 Other specified health status: Secondary | ICD-10-CM

## 2014-05-31 DIAGNOSIS — IMO0002 Reserved for concepts with insufficient information to code with codable children: Secondary | ICD-10-CM

## 2014-05-31 LAB — HCG, QUANTITATIVE, PREGNANCY: HCG, BETA CHAIN, QUANT, S: 1.47 m[IU]/mL

## 2014-05-31 NOTE — Telephone Encounter (Signed)
Labs ordered, pt advised to call and schedule lab appt.

## 2014-06-04 ENCOUNTER — Telehealth: Payer: Self-pay | Admitting: *Deleted

## 2014-06-04 NOTE — Telephone Encounter (Signed)
PT  AWARE OF MONITOR  RESULTS  PER  DR NISHAN  NSR NO SIG  ARRHYTHMIAS./CY 

## 2014-06-10 ENCOUNTER — Encounter: Payer: Self-pay | Admitting: *Deleted

## 2014-06-11 ENCOUNTER — Encounter: Payer: Self-pay | Admitting: Neurology

## 2014-06-11 ENCOUNTER — Ambulatory Visit (INDEPENDENT_AMBULATORY_CARE_PROVIDER_SITE_OTHER): Payer: BC Managed Care – PPO | Admitting: Neurology

## 2014-06-11 VITALS — BP 104/70 | HR 85 | Ht 64.57 in | Wt 204.4 lb

## 2014-06-11 DIAGNOSIS — G43809 Other migraine, not intractable, without status migrainosus: Secondary | ICD-10-CM

## 2014-06-11 DIAGNOSIS — G43109 Migraine with aura, not intractable, without status migrainosus: Secondary | ICD-10-CM

## 2014-06-11 MED ORDER — FLURBIPROFEN 50 MG PO TABS: 50.0000 mg | ORAL_TABLET | Freq: Two times a day (BID) | ORAL | Status: DC | PRN

## 2014-06-11 NOTE — Patient Instructions (Addendum)
1.  Start magnesium 400mg  daily 2.  For moderate to severe headaches, take Ansaid 50mg  twice daily as needed. 3.  Do not take rescue medication (tylenol, ibuprofen, Aleve, etc) more than 9 days per month 4.  Return to clinic as needed

## 2014-06-11 NOTE — Progress Notes (Signed)
[facility] Neurology [facility] Clinic Note - Initial Visit   Date: 06/11/2014  Teresa Blowerlexandria C Carnegie MRN: [id number] DOB: 08/05/1990   Dear Dr Beverely Lowabori:   Thank you for your kind referral of Teresa Bartlett for consultation of dizziness. Although her history is well known to you, please allow us to reiterate it for the purpose of our medical record. The patient was accompanied to the clinic by her two sons.     History of Present Illness: Teresa Bartlett is a 24 y.o. ambidextrious-handed Caucasian female with history of IBS presenting for evaluation of dizziness.  She was in her usual state of health until the end of February 2015.  [place] and went to the emergency department at the end of March 17th because of 3-day history of palpitations and dizziness and underwent cardiac evaluation which returned negative. Her palpitations were occuring daily and has been evaluated by Dr. Eden EmmsNishan in Cardiology with normal holter.  She went to see Dr. Pollyann Kennedyosen in ENT whose evaluation was normal.   She had episodic spells consists of "dizziness triggered by head movement, tunnel vision, nausea with usually migraine that follows several hours later".  Headache is described as sharp, starting at the base of her head and ears.  Duration is 24 hours.  They were occuring daily until late May, but recently improved. She takes ibuprofen which helps.  Denies any triggers.  Rest and drinking water helps her symptoms.  She trips into walls.  She has taken meclizine which did not help.  She also complains of whole body pain (shoulders, elbows, palms, flank, legs, knees, dorsum of the feet).  Pain is described as achy and intermittently as if she is being "poked" with pins and needle.  She complains of a multitude of other symptoms including vision changes, "mental fog", joint pain, tinnitus, weakness, palpitations, chest pain, and provides a detailed daily journal  account of these symptoms.  Her mother and son has migraines.     Out-side paper records, electronic medical record, and images have been reviewed where available and summarized as:  Lab Results  Component Value Date   TSH 0.59 03/18/2014   Labs 09/13/2013:  Vitamin D1 64    Past Medical History  Diagnosis Date  . Hip dysplasia   . Sacroiliac joint dysfunction of both sides   . IBS (irritable bowel syndrome)   . Normal pregnancy 06/15/2012  . Depression   . Anxiety   . Asthma     "very mild"  . Complication of anesthesia     convulsion with epidural redose  . IDA (iron deficiency anemia)   . Cardiac dysrhythmia, unspecified   . Migraine, unspecified, without mention of intractable migraine without mention of status migrainosus     Past Surgical History  Procedure Laterality Date  . Laparoscopic tubal ligation  09/16/2012    Procedure: LAPAROSCOPIC TUBAL LIGATION;  Surgeon: Oliver PilaKathy W Richardson, MD;  Location: [facility];  Service: Gynecology;  Laterality: Bilateral;     Medications:  No current outpatient prescriptions on file prior to visit.   No current facility-administered medications on file prior to visit.    Allergies:  Allergies  Allergen Reactions  . Other Hives    Glue used for EKG leads, other adhesives    Family History: Family History  Problem Relation Age of Onset  . Diabetes Mother   . Arthritis Mother   . Kidney disease Mother   . Mental illness Mother   .  Fibromyalgia Mother   . Heart disease Father   . Diabetes Father   . Hypertension Father   . Hyperlipidemia Father   . Mental illness Father     ptsd and bipolar  . Heart attack Father   . Depression Father   . Heart disease Paternal Uncle   . Diabetes Maternal Grandmother   . Arthritis Maternal Grandmother   . Cancer Paternal Grandmother     colon  . Hyperlipidemia Paternal Grandmother   . Hypertension Paternal Grandmother   . Diabetes Paternal Grandfather   . Hyperlipidemia Paternal  Grandfather   . Hypertension Paternal Grandfather   . Mental illness Paternal Grandfather   . Heart disease Paternal Grandfather   . Other Neg Hx   . Migraines Mother   . Osteoporosis Mother     Social History: History   Social History  . Marital Status: Married    Spouse Name: N/A    Number of Children: N/A  . Years of Education: N/A   Occupational History  . Not on file.   Social History Main Topics  . Smoking status: Former Smoker -- 2 years    Types: Cigarettes    Quit date: 06/05/2008  . Smokeless tobacco: Never Used  . Alcohol Use: Yes     Comment: occasional   . Drug Use: No  . Sexual Activity: No   Other Topics Concern  . Not on file   Social History Narrative   She is a stay at home mother.   She lives with husband and two sons.   Highest level of education:  Two years of college    Review of Systems:  CONSTITUTIONAL: No fevers, chills, night sweats, or weight loss.   EYES: +visual changes or eye pain ENT: +hearing changes.  No history of nose bleeds.   RESPIRATORY: No cough, wheezing and shortness of breath.   CARDIOVASCULAR: Negative for chest pain, +palpitations.   GI: +for abdominal discomfort, blood in stools or black stools.  +recent change in bowel habits.   GU:  No history of incontinence.   MUSCLOSKELETAL: +history of joint pain or swelling.  +myalgias.   SKIN: Negative for lesions, rash, and itching.   HEMATOLOGY/ONCOLOGY: Negative for prolonged bleeding, +bruising easily, and swollen nodes.  No history of cancer.   ENDOCRINE: Negative for cold or heat intolerance, polydipsia or goiter.   PSYCH:  No depression or anxiety symptoms.   NEURO: As Above.   Vital Signs:  BP 104/70  Pulse 85  Ht 5' 4.57" (1.64 m)  Wt 204 lb 7 oz (92.732 kg)  BMI 34.48 kg/m2  SpO2 97%   General Medical Exam:   General:  Well appearing, comfortable.   Eyes/ENT: see cranial nerve examination.   Neck: No masses appreciated.  Full range of motion without  tenderness.  No carotid bruits.  Lhermitte's sign is negative.  No tenderness over the greater or lessor occipital nerves. Respiratory:  Clear to auscultation, good air entry bilaterally.   Cardiac:  Regular rate and rhythm, no murmur.   Back:  No pain to palpation of spinous processes.   Extremities:  No deformities, edema, or skin discoloration. Good capillary refill.   Skin:  Skin color, texture, turgor normal. No rashes or lesions.  Neurological Exam: MENTAL STATUS including orientation to time, place, person, recent and remote memory, attention span and concentration, language, and fund of knowledge is normal.  Speech is not dysarthric.  CRANIAL NERVES: II:  No visual field defects.  Unremarkable fundi.  III-IV-VI: Pupils equal round and reactive to light.  Normal conjugate, extra-ocular eye movements in all directions of gaze.  No nystagmus.  No ptosis.   V:  Normal facial sensation.  Jaw jerk is .   VII:  Normal facial symmetry and movements.  No pathologic facial reflexes.  VIII:  Normal hearing and vestibular function.   IX-X:  Normal palatal movement.   XI:  Normal shoulder shrug and head rotation.   XII:  Normal tongue strength and range of motion, no deviation or fasciculation.  MOTOR:  No atrophy, fasciculations or abnormal movements.  No pronator drift.  Tone is normal.    Right Upper Extremity:    Left Upper Extremity:    Deltoid  5/5   Deltoid  5/5   Biceps  5/5   Biceps  5/5   Triceps  5/5   Triceps  5/5   Wrist extensors  5/5   Wrist extensors  5/5   Wrist flexors  5/5   Wrist flexors  5/5   Finger extensors  5/5   Finger extensors  5/5   Finger flexors  5/5   Finger flexors  5/5   Dorsal interossei  5/5   Dorsal interossei  5/5   Abductor pollicis  5/5   Abductor pollicis  5/5   Tone (Ashworth scale)  0  Tone (Ashworth scale)  0   Right Lower Extremity:    Left Lower Extremity:    Hip flexors  5/5   Hip flexors  5/5   Hip extensors  5/5   Hip extensors  5/5     Knee flexors  5/5   Knee flexors  5/5   Knee extensors  5/5   Knee extensors  5/5   Dorsiflexors  5/5   Dorsiflexors  5/5   Plantarflexors  5/5   Plantarflexors  5/5   Toe extensors  5/5   Toe extensors  5/5   Toe flexors  5/5   Toe flexors  5/5   Tone (Ashworth scale)  0  Tone (Ashworth scale)  0   MSRs:  Right                                                                 Left brachioradialis 2+  brachioradialis 2+  biceps 2+  biceps 2+  triceps 2+  triceps 2+  patellar 2+  patellar 2+  ankle jerk 1+  ankle jerk 1+  Hoffman no  Hoffman no  plantar response down  plantar response down   SENSORY:  Normal and symmetric perception of light touch, pinprick, vibration, and proprioception.  Romberg's sign absent.   COORDINATION/GAIT: Normal finger-to- nose-finger and heel-to-shin.  Intact rapid alternating movements bilaterally.  Able to rise from a chair without using arms.  Gait narrow based and stable. Tandem and stressed gait intact.    IMPRESSION: Teresa Bartlett is a 24 year-old female presenting for evaluation of a multitude of complaints, mostly dizziness and headaches.  Her exam is non-focal and non-lateralizing. 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 I discussed at length regarding the pathogenesis, etiology, management, and prognosis of migraines.  She has strong family history of migraine variants in her mother and son.  Fortunately, she is doing better of  the past 2 weeks and is not keen on starting any prescribed preventative therapy at this time. Given the severity of symptoms, I think it is reasonable to at least take oral magnesium supplements had headache preventative medication.  If her symptoms do not improve, will consider MRI brain going forward, but my clinical suspicion for neurodegenerative condition is low.     PLAN/RECOMMENDATIONS:  1.  Start magnesium 400mg  daily 2.  For moderate to severe headaches, take Ansaid 50mg  twice daily as  needed. 3.  Do not take rescue medication (tylenol, ibuprofen, Aleve, etc) more than 9 days per month 4.  Lifestyle modification including regular sleep, good diet habits, and staying active was discussed at length 5.  Return to clinic as needed   The duration of this appointment visit was 45 minutes of face-to-face time with the patient.  Greater than 50% of this time was spent in counseling, explanation of diagnosis, planning of further management, and coordination of care.   Thank you for allowing me to participate in patient's care.  If I can answer any additional questions, I would be pleased to do so.    Sincerely,    Ricke Kimoto K. Allena Katz, DO

## 2014-06-11 NOTE — Progress Notes (Signed)
Note faxed.

## 2014-07-08 ENCOUNTER — Encounter: Payer: Self-pay | Admitting: Neurology

## 2014-07-08 NOTE — Telephone Encounter (Signed)
Pt is coming in to see you tomorrow 07-09-14 at 2:45

## 2014-07-09 ENCOUNTER — Encounter: Payer: Self-pay | Admitting: *Deleted

## 2014-07-09 ENCOUNTER — Ambulatory Visit (INDEPENDENT_AMBULATORY_CARE_PROVIDER_SITE_OTHER): Payer: BC Managed Care – PPO | Admitting: Neurology

## 2014-07-09 ENCOUNTER — Encounter: Payer: Self-pay | Admitting: Neurology

## 2014-07-09 VITALS — BP 108/70 | HR 78 | Ht 64.96 in | Wt 204.3 lb

## 2014-07-09 DIAGNOSIS — R51 Headache: Secondary | ICD-10-CM

## 2014-07-09 DIAGNOSIS — G43109 Migraine with aura, not intractable, without status migrainosus: Secondary | ICD-10-CM

## 2014-07-09 DIAGNOSIS — R42 Dizziness and giddiness: Secondary | ICD-10-CM

## 2014-07-09 DIAGNOSIS — R209 Unspecified disturbances of skin sensation: Secondary | ICD-10-CM

## 2014-07-09 DIAGNOSIS — H539 Unspecified visual disturbance: Secondary | ICD-10-CM

## 2014-07-09 MED ORDER — TOPIRAMATE 25 MG PO TABS: ORAL_TABLET | ORAL | Status: DC

## 2014-07-09 MED ORDER — METHYLPREDNISOLONE (PAK) 4 MG PO TABS: ORAL_TABLET | ORAL | Status: DC

## 2014-07-09 NOTE — Patient Instructions (Addendum)
1.  Start topamax 25mg  daily x 2 weeks, the increase to 2 tab daily 2.  Start medrol dose pak 3.  MRI brain wo contrast 4.  Return to clinic in 6 weeks

## 2014-07-09 NOTE — Progress Notes (Signed)
Follow-up Visit   Date: 07/09/2014    Teresa Bartlett MRN: [id number] DOB: 02/13/1990   Interim History: Teresa Bartlett is a 24 y.o. ambidextrous-handed Caucasian female with history of IBS returning to the clinic for follow-up of migraine with vertigo.  The patient was accompanied to the clinic by her son.  History of present illness: She was in her usual state of health until the end of February 2015. She moved in March 2015 from [place], [place] and went to the emergency department at the end of March 17th because of 3-day history of palpitations and dizziness and underwent cardiac evaluation which returned negative. Her palpitations were occuring daily and has been evaluated by Dr. Eden Bartlett in Cardiology with normal holter. She went to see Dr. Pollyann Bartlett in ENT whose evaluation was normal.   She had episodic spells consists of "dizziness triggered by head movement, tunnel vision, nausea with usually migraine that follows several hours later". Headache is described as sharp, starting at the base of her head and ears. Duration is 24 hours. They were occuring daily until late May, but recently improved. She takes ibuprofen which helps. Denies any triggers. Rest and drinking water helps her symptoms. She trips into walls. She has taken meclizine which did not help.   She also complains of whole body pain (shoulders, elbows, palms, flank, legs, knees, dorsum of the feet). Pain is described as achy and intermittently as if she is being "poked" with pins and needle. She complains of a multitude of other symptoms including vision changes, "mental fog", joint pain, tinnitus, weakness, palpitations, chest pain, and provides a detailed daily journal account of these symptoms.   Her mother and son has migraines.   UPDATE 07/09/2014:  She made sooner appointment due to worsening headaches and nausea.  This morning she woke up with severe dizziness which is not alleviated by anything. She tried  taking magnesium daily, but denies any benefit after the first few days of being on it. She did not fill her prescription of NSAID. Headaches are worse when she lays flat.   Medications:  Current Outpatient Prescriptions on File Prior to Visit  Medication Sig Dispense Refill  . ALPRAZolam (XANAX) 0.5 MG tablet Take 0.5 mg by mouth at bedtime as needed for anxiety.      . flurbiprofen (ANSAID) 50 MG tablet Take 1 tablet (50 mg total) by mouth 2 (two) times daily as needed.  20 tablet  3   No current facility-administered medications on file prior to visit.    Allergies:  Allergies  Allergen Reactions  . Other Hives    Glue used for EKG leads, other adhesives     Review of Systems:  CONSTITUTIONAL: No fevers, chills, night sweats, or weight loss.  + Headaches, dizziness EYES: No visual changes or eye pain ENT: No hearing changes.  No history of nose bleeds.   RESPIRATORY: No cough, wheezing and shortness of breath.   CARDIOVASCULAR: Negative for chest pain, and palpitations.   GI: Negative for abdominal discomfort, blood in stools or black stools.  No recent change in bowel habits.   GU:  No history of incontinence.   MUSCLOSKELETAL: + history of joint pain or swelling.  + myalgias.   SKIN: Negative for lesions, rash, and itching.   ENDOCRINE: Negative for cold or heat intolerance, polydipsia or goiter.   PSYCH:  + depression or anxiety symptoms.   NEURO: As Above.   Vital Signs:  BP 108/70  Pulse 78  Ht 5' 4.96" (1.65 m)  Wt 204 lb 5 oz (92.676 kg)  BMI 34.04 kg/m2  SpO2 98%  Neurological Exam: MENTAL STATUS including orientation to time, place, person, recent and remote memory, attention span and concentration, language, and fund of knowledge is normal.  Speech is not dysarthric.  CRANIAL NERVES: No visual field defects.  Pupils equal round and reactive to light.  Normal conjugate, extra-ocular eye movements in all directions of gaze.  No ptosis.  Face is symmetric.  Palate elevates symmetrically.  Tongue is midline.  MOTOR:  Motor strength is 5/5 in all extremities  MSRs:  Reflexes are 2+/4 throughout, except 1+ at the ankles bilaterally.  COORDINATION/GAIT:  Gait narrow based and stable.   Data: Lab Results  Component Value Date   TSH 0.59 03/18/2014     IMPRESSION: Ms. Teresa Bartlett is a 24 year old female presenting for evaluation of dizziness, generalized body aches, fatigue, paresthesias, and headaches. Although her exam remains nonfocal and nonlateralizing and my suspicion for vestibular migraine is high, the fact that she has worsening headaches with laying flat is concerning, so I would like to obtain MRI brain without contrast look for intracranial pathology.   PLAN/RECOMMENDATIONS:  1. Start Medrol Dosepak 2. Start Topamax 25 mg daily, and increase to 50 mg daily after 2 weeks. Risks and benefits discussed. She has had her fallopian tubes tied and has no plans for pregnancy. 3.  MRI brain without contrast 4.  return to clinic in 6 weeks or sooner as needed   The duration of this appointment visit was 25 minutes of face-to-face time with the patient.  Greater than 50% of this time was spent in counseling, explanation of diagnosis, planning of further management, and coordination of care.   Thank you for allowing me to participate in patient's care.  If I can answer any additional questions, I would be pleased to do so.    Sincerely,    Teresa Wahlert K. Allena KatzPatel, DO

## 2014-07-19 ENCOUNTER — Encounter: Payer: Self-pay | Admitting: Neurology

## 2014-07-20 ENCOUNTER — Encounter: Payer: Self-pay | Admitting: Neurology

## 2014-07-20 ENCOUNTER — Ambulatory Visit: Payer: BC Managed Care – PPO | Admitting: *Deleted

## 2014-07-20 DIAGNOSIS — G43009 Migraine without aura, not intractable, without status migrainosus: Secondary | ICD-10-CM

## 2014-07-20 MED ORDER — KETOROLAC TROMETHAMINE 30 MG/ML IM SOLN: 30.0000 mg | Freq: Once | INTRAMUSCULAR | Status: DC

## 2014-07-20 MED ORDER — METOCLOPRAMIDE HCL 5 MG/ML IJ SOLN
5.0000 mg | Freq: Once | INTRAMUSCULAR | Status: AC
  Administered 2014-07-20: 5 mg via INTRAMUSCULAR

## 2014-07-20 MED ORDER — KETOROLAC TROMETHAMINE 30 MG/ML IJ SOLN
30.0000 mg | Freq: Once | INTRAMUSCULAR | Status: AC
  Administered 2014-07-20: 30 mg via INTRAVENOUS

## 2014-07-20 NOTE — Progress Notes (Signed)
Patient in for Reglan and Toradol injections.

## 2014-07-29 ENCOUNTER — Encounter: Payer: Self-pay | Admitting: *Deleted

## 2014-08-03 ENCOUNTER — Ambulatory Visit (HOSPITAL_COMMUNITY): Admission: RE | Admit: 2014-08-03 | Payer: BC Managed Care – PPO | Source: Ambulatory Visit

## 2014-08-04 ENCOUNTER — Other Ambulatory Visit: Payer: Self-pay | Admitting: Family Medicine

## 2014-08-04 NOTE — Telephone Encounter (Signed)
Last OV 05-10-14 (contact dermatitis) Alprazolam shows it was originally filled in error please advise.    Low risk

## 2014-08-04 NOTE — Telephone Encounter (Signed)
Med filled and faxed.  

## 2014-08-24 ENCOUNTER — Ambulatory Visit (INDEPENDENT_AMBULATORY_CARE_PROVIDER_SITE_OTHER): Payer: BC Managed Care – PPO | Admitting: Neurology

## 2014-08-24 ENCOUNTER — Encounter: Payer: Self-pay | Admitting: Neurology

## 2014-08-24 VITALS — BP 104/70 | HR 75 | Ht 64.0 in | Wt 207.5 lb

## 2014-08-24 DIAGNOSIS — G43109 Migraine with aura, not intractable, without status migrainosus: Secondary | ICD-10-CM

## 2014-08-24 DIAGNOSIS — G43809 Other migraine, not intractable, without status migrainosus: Secondary | ICD-10-CM | POA: Insufficient documentation

## 2014-08-24 NOTE — Progress Notes (Signed)
Follow-up Visit   Date: 08/24/2014    Teresa Bartlett MRN: [id number] DOB: 11-May-1990   Interim History: Teresa Bartlett is a 24 y.o. ambidextrous-handed Caucasian female with history of IBS returning to the clinic for follow-up of migraine with vertigo.  The patient was accompanied to the clinic by her son.  History of present illness: She was in her usual state of health until the end of February 2015. She moved in March 2015 from [place], [place] and went to the emergency department at the end of March 17th because of 3-day history of palpitations and dizziness and underwent cardiac evaluation which returned negative. Her palpitations were occuring daily and has been evaluated by Dr. Eden Emms in Cardiology with normal holter. She went to see Dr. Pollyann Kennedy in ENT whose evaluation was normal.   She had episodic spells consists of "dizziness triggered by head movement, tunnel vision, nausea with usually migraine that follows several hours later". Headache is described as sharp, starting at the base of her head and ears. Duration is 24 hours. They were occuring daily until late May, but recently improved. She takes ibuprofen which helps. Denies any triggers. Rest and drinking water helps her symptoms. She trips into walls. She has taken meclizine which did not help.   She also complains of whole body pain (shoulders, elbows, palms, flank, legs, knees, dorsum of the feet). Pain is described as achy and intermittently as if she is being "poked" with pins and needle. She complains of a multitude of other symptoms including vision changes, "mental fog", joint pain, tinnitus, weakness, palpitations, chest pain, and provides a detailed daily journal account of these symptoms.   Her mother and son has migraines.   Follow-up 07/09/2014:  She made sooner appointment due to worsening headaches and nausea.  This morning she woke up with severe dizziness which is not alleviated by anything. She tried  taking magnesium daily, but denies any benefit after the first few days of being on it. She did not fill her prescription of NSAID. Headaches are worse when she lays flat.  UPDATE 08/24/2014:  She reports feeling great since late July when she received toradol injection and was started on topamax. Her headaches, dizziness, double vision, and generalized body aches has since resolved.  She continues to have occasional tingling of the mouth.  Cognitive changes has also improved and she is back to herself.   Medications:  Current Outpatient Prescriptions on File Prior to Visit  Medication Sig Dispense Refill  . ALPRAZolam (XANAX) 0.5 MG tablet TAKE 1 TABLET BY MOUTH THREE TIMES DAILY AS NEEDED FOR ANXIETY  45 tablet  0  . topiramate (TOPAMAX) 25 MG tablet Take 1 tab daily x 2 weeks, then increase to 2 tab daily.  60 tablet  3   No current facility-administered medications on file prior to visit.    Allergies:  Allergies  Allergen Reactions  . Other Hives    Glue used for EKG leads, other adhesives     Review of Systems:  CONSTITUTIONAL: No fevers, chills, night sweats, or weight loss.   EYES: No visual changes or eye pain ENT: No hearing changes.  No history of nose bleeds.   RESPIRATORY: No cough, wheezing and shortness of breath.   CARDIOVASCULAR: Negative for chest pain, and palpitations.   GI: Negative for abdominal discomfort, blood in stools or black stools.  No recent change in bowel habits.   GU:  No history of incontinence.   MUSCLOSKELETAL: + history of  joint pain or swelling.    SKIN: Negative for lesions, rash, and itching.   ENDOCRINE: Negative for cold or heat intolerance, polydipsia or goiter.   PSYCH:  No depression or anxiety symptoms.   NEURO: As Above.   Vital Signs:  BP 104/70  Pulse 75  Ht  (1.626 m)  Wt 207 lb 8 oz (94.121 kg)  BMI 35.60 kg/m2  SpO2 98%  Neurological Exam: MENTAL STATUS including orientation to time, place, person, recent and  remote memory, attention span and concentration, language, and fund of knowledge is normal.  Speech is not dysarthric.  CRANIAL NERVES:  Pupils equal round and reactive to light.  Normal conjugate, extra-ocular eye movements in all directions of gaze.  No ptosis.  Face is symmetric. Palate elevates symmetrically.  Tongue is midline.  MOTOR:  Motor strength is 5/5 in all extremities  MSRs:  Reflexes are 2+/4 throughout, except 1+ at the ankles bilaterally.  COORDINATION/GAIT:  Gait narrow based and stable.   Data: Lab Results  Component Value Date   TSH 0.59 03/18/2014     IMPRESSION: 1.  Vestibular migraine, chronic, and now doing much better!  - Previously tried medrol dose back without effect, patient not interested in prednisone due to bad effects in the past  - Significant improvement with Toradol  - For maintenance therapy, continue topiramate  daily. Tolerating it well, mild distal edema of extremities 2.  For rescue therapy, take naprosyn  or ibuprofen  with benadryl . 3.  Return to clinic in 60-months, or sooner as needed   The duration of this appointment visit was 20 minutes of face-to-face time with the patient.  Greater than 50% of this time was spent in counseling, explanation of diagnosis, planning of further management, and coordination of care.   Thank you for allowing me to participate in patient's care.  If I can answer any additional questions, I would be pleased to do so.    Sincerely,    Donika K. Allena Katz, DO

## 2014-08-24 NOTE — Patient Instructions (Signed)
1.  For rescue therapy, take naprosyn  or ibuprofen  with benadryl . 2.  For maintenance therapy, continue topiramate  daily 3.  Return to clinic in 57-months

## 2014-10-05 ENCOUNTER — Ambulatory Visit (INDEPENDENT_AMBULATORY_CARE_PROVIDER_SITE_OTHER): Payer: BC Managed Care – PPO | Admitting: Family Medicine

## 2014-10-05 ENCOUNTER — Encounter: Payer: Self-pay | Admitting: Family Medicine

## 2014-10-05 VITALS — BP 124/84 | HR 94 | Temp 98.5°F | Resp 16 | Wt 205.5 lb

## 2014-10-05 DIAGNOSIS — IMO0001 Reserved for inherently not codable concepts without codable children: Secondary | ICD-10-CM

## 2014-10-05 DIAGNOSIS — Z23 Encounter for immunization: Secondary | ICD-10-CM

## 2014-10-05 DIAGNOSIS — M2550 Pain in unspecified joint: Secondary | ICD-10-CM

## 2014-10-05 DIAGNOSIS — M609 Myositis, unspecified: Secondary | ICD-10-CM

## 2014-10-05 DIAGNOSIS — R5383 Other fatigue: Secondary | ICD-10-CM

## 2014-10-05 DIAGNOSIS — M791 Myalgia: Secondary | ICD-10-CM

## 2014-10-05 LAB — CBC WITH DIFFERENTIAL/PLATELET
Basophils Absolute: 0 10*3/uL (ref 0.0–0.1)
Basophils Relative: 0.4 % (ref 0.0–3.0)
EOS PCT: 3.7 % (ref 0.0–5.0)
Eosinophils Absolute: 0.2 10*3/uL (ref 0.0–0.7)
HCT: 37.3 % (ref 36.0–46.0)
Hemoglobin: 12.7 g/dL (ref 12.0–15.0)
Lymphocytes Relative: 36.8 % (ref 12.0–46.0)
Lymphs Abs: 1.9 10*3/uL (ref 0.7–4.0)
MCHC: 33.9 g/dL (ref 30.0–36.0)
MCV: 88.9 fl (ref 78.0–100.0)
Monocytes Absolute: 0.4 10*3/uL (ref 0.1–1.0)
Monocytes Relative: 8 % (ref 3.0–12.0)
NEUTROS PCT: 51.1 % (ref 43.0–77.0)
Neutro Abs: 2.7 10*3/uL (ref 1.4–7.7)
Platelets: 263 10*3/uL (ref 150.0–400.0)
RBC: 4.2 Mil/uL (ref 3.87–5.11)
RDW: 12.6 % (ref 11.5–15.5)
WBC: 5.3 10*3/uL (ref 4.0–10.5)

## 2014-10-05 LAB — SEDIMENTATION RATE: Sed Rate: 25 mm/hr — ABNORMAL HIGH (ref 0–22)

## 2014-10-05 MED ORDER — DULOXETINE HCL 30 MG PO CPEP: 30.0000 mg | ORAL_CAPSULE | Freq: Every day | ORAL | Status: DC

## 2014-10-05 MED ORDER — ALPRAZOLAM 0.5 MG PO TABS: ORAL_TABLET | ORAL | Status: DC

## 2014-10-05 NOTE — Assessment & Plan Note (Signed)
New.  Check labs to r/o rhabdo.  Suspect this is due to underlying fibromyalgia.  Start Cymbalta and monitor for improvement.  Pt expressed understanding and is in agreement w/ plan.

## 2014-10-05 NOTE — Assessment & Plan Note (Signed)
New.  Check labs to r/o underlying cause.  Suspect this is due to fibromyalgia.  Start Cymbalta for pain.  Will follow.

## 2014-10-05 NOTE — Progress Notes (Signed)
   Subjective:    Patient ID: Teresa Bartlett, female    DOB: [birth year], 24 y.o.   MRN: [id number]  HPI Fatigue- 'i'm just so tired'.  sxs started ~6 weeks ago.  Pt reports she typically wakes at 5:45 and 'hit the ground running' and lately it's been 8:30 and 'i'm dragging myself out of bed'.  Now having near constant muscle aches, joint pain.  No fevers.  No obvious swelling or redness of joints.   Review of Systems For ROS see HPI     Objective:   Physical Exam  Vitals reviewed. Constitutional: She is oriented to person, place, and time. She appears well-developed and well-nourished. No distress.  HENT:  Head: Normocephalic and atraumatic.  Eyes: Conjunctivae and EOM are normal. Pupils are equal, round, and reactive to light.  Neck: Normal range of motion. Neck supple. No thyromegaly present.  Cardiovascular: Normal rate, regular rhythm, normal heart sounds and intact distal pulses.   No murmur heard. Pulmonary/Chest: Effort normal and breath sounds normal. No respiratory distress.  Abdominal: Soft. She exhibits no distension. There is no tenderness.  Musculoskeletal: She exhibits tenderness (TTP over multiple trigger points). She exhibits no edema.  Lymphadenopathy:    She has no cervical adenopathy.  Neurological: She is alert and oriented to person, place, and time. No cranial nerve deficit. Coordination normal.  Skin: Skin is warm and dry.  Psychiatric: She has a normal mood and affect. Her behavior is normal.          Assessment & Plan:

## 2014-10-05 NOTE — Patient Instructions (Signed)
Follow up in 6 weeks to recheck pain and mood We'll notify you of your lab results and make any changes if needed If all labs are normal, we'll start the Cymbalta daily for mood, fatigue, and pain Continue to try and get regular exercise- this will help! Call with any questions or concerns Hang in there!

## 2014-10-05 NOTE — Progress Notes (Signed)
Pre visit review using our clinic review tool, if applicable. No additional management support is needed unless otherwise documented below in the visit note. 

## 2014-10-05 NOTE — Assessment & Plan Note (Signed)
New.  Suspect pt's triad of sxs- fatigue, depression, pain- are due to fibromyalgia.  Pt's mother w/ similar.  Check labs to r/o anemia, thyroid abnormality, etc.  Start low dose Cymbalta to treat both depression and pain.  Will follow.

## 2014-10-06 LAB — BASIC METABOLIC PANEL
BUN: 12 mg/dL (ref 6–23)
CALCIUM: 8.9 mg/dL (ref 8.4–10.5)
CO2: 19 meq/L (ref 19–32)
CREATININE: 0.7 mg/dL (ref 0.4–1.2)
Chloride: 110 mEq/L (ref 96–112)
GFR: 117.01 mL/min (ref >60.00)
Glucose, Bld: 79 mg/dL (ref 70–99)
Potassium: 4.1 mEq/L (ref 3.5–5.1)
SODIUM: 137 meq/L (ref 135–145)

## 2014-10-06 LAB — CK: CK TOTAL: 69 U/L (ref 7–177)

## 2014-10-06 LAB — ANA: ANA: NEGATIVE

## 2014-10-06 LAB — TSH: TSH: 0.86 u[IU]/mL (ref 0.35–4.50)

## 2014-11-01 ENCOUNTER — Encounter: Payer: Self-pay | Admitting: Family Medicine

## 2014-11-09 ENCOUNTER — Encounter: Payer: Self-pay | Admitting: Family Medicine

## 2014-11-09 ENCOUNTER — Ambulatory Visit (INDEPENDENT_AMBULATORY_CARE_PROVIDER_SITE_OTHER): Payer: BC Managed Care – PPO | Admitting: Family Medicine

## 2014-11-09 VITALS — BP 128/84 | HR 94 | Temp 98.1°F | Resp 16 | Wt 210.5 lb

## 2014-11-09 DIAGNOSIS — M25532 Pain in left wrist: Secondary | ICD-10-CM | POA: Insufficient documentation

## 2014-11-09 MED ORDER — MELOXICAM 15 MG PO TABS: 15.0000 mg | ORAL_TABLET | Freq: Every day | ORAL | Status: DC

## 2014-11-09 NOTE — Progress Notes (Signed)
   Subjective:    Patient ID: Teresa Bartlett, female    DOB: September 13, 1990, 24 y.o.   MRN: [id number]  HPI L wrist pain- pt reports hx of similar since college.  This weekend developed swelling over thumb and wrist.  Fingers will curl and straightening them was 'a lot of effort'.  Alternating Aleve, ice, heat w/ some relief.  Pain was so severe on Saturday night that she debated going to the ER.  Painful to open jars and pick up kids.   Review of Systems For ROS see HPI     Objective:   Physical Exam  Constitutional: She is oriented to person, place, and time. She appears well-developed and well-nourished. No distress.  Cardiovascular: Intact distal pulses.   Musculoskeletal: She exhibits tenderness (TTP over L 1st CMC and MCP joints w/ some edema).  Pain w/ wrist flexion and ulnar deviation  Neurological: She is alert and oriented to person, place, and time.  Skin: Skin is warm and dry. No erythema.  Vitals reviewed.         Assessment & Plan:

## 2014-11-09 NOTE — Progress Notes (Signed)
Pre visit review using our clinic review tool, if applicable. No additional management support is needed unless otherwise documented below in the visit note. 

## 2014-11-09 NOTE — Patient Instructions (Signed)
Follow up as needed Start the Mobic once daily for inflammation- take w/ food Continue to alternate ice/heat We'll call you with your hand surgery appt Call with any questions or concerns Happy Holidays!!!

## 2014-11-09 NOTE — Assessment & Plan Note (Signed)
New.  Pain and swelling have significantly improved since this weekend but pt w/ hx of similar dating back at least 10 yrs.  Due to severity and recurrent nature of problem, will refer to hand specialist for complete evaluation.  Start mobic for pain/inflammation.  Will follow.

## 2014-11-16 ENCOUNTER — Telehealth: Payer: Self-pay | Admitting: *Deleted

## 2014-11-16 ENCOUNTER — Ambulatory Visit: Payer: BC Managed Care – PPO | Admitting: Family Medicine

## 2014-11-16 NOTE — Telephone Encounter (Signed)
See note below

## 2014-11-16 NOTE — Telephone Encounter (Signed)
Pt needs no-show fee 

## 2014-11-16 NOTE — Telephone Encounter (Signed)
Pt did not show for appointment 11/16/2014 at 8:45am for 6 wk follow up

## 2014-12-31 DIAGNOSIS — M797 Fibromyalgia: Secondary | ICD-10-CM

## 2014-12-31 HISTORY — DX: Fibromyalgia: M79.7

## 2015-01-25 NOTE — Progress Notes (Signed)
1 

## 2015-02-17 ENCOUNTER — Telehealth: Payer: Self-pay | Admitting: *Deleted

## 2015-02-17 NOTE — Telephone Encounter (Signed)
Patient canceled her 6 month follow up and did not reschedule at this time

## 2015-02-24 ENCOUNTER — Ambulatory Visit: Payer: BC Managed Care – PPO | Admitting: Neurology

## 2015-03-07 ENCOUNTER — Ambulatory Visit: Payer: Self-pay | Admitting: Family Medicine

## 2015-03-09 ENCOUNTER — Telehealth: Payer: Self-pay | Admitting: Family Medicine

## 2015-03-09 ENCOUNTER — Encounter: Payer: Self-pay | Admitting: Family Medicine

## 2015-03-09 NOTE — Telephone Encounter (Signed)
Charge request forwarded °

## 2015-03-09 NOTE — Telephone Encounter (Signed)
Pt was no show for acute appt on 3/7- letter sent- charge no show?

## 2015-03-09 NOTE — Telephone Encounter (Signed)
Needs no show fee 

## 2015-03-11 ENCOUNTER — Encounter: Payer: Self-pay | Admitting: Family Medicine

## 2015-04-20 ENCOUNTER — Emergency Department (HOSPITAL_COMMUNITY): Payer: BLUE CROSS/BLUE SHIELD

## 2015-04-20 ENCOUNTER — Encounter (HOSPITAL_COMMUNITY): Payer: Self-pay | Admitting: Emergency Medicine

## 2015-04-20 ENCOUNTER — Emergency Department (HOSPITAL_COMMUNITY)
Admission: EM | Admit: 2015-04-20 | Discharge: 2015-04-20 | Disposition: A | Discharge status: Home / Self Care | Payer: BLUE CROSS/BLUE SHIELD | Attending: Emergency Medicine | Admitting: Emergency Medicine

## 2015-04-20 DIAGNOSIS — F329 Major depressive disorder, single episode, unspecified: Secondary | ICD-10-CM | POA: Insufficient documentation

## 2015-04-20 DIAGNOSIS — Z8719 Personal history of other diseases of the digestive system: Secondary | ICD-10-CM | POA: Insufficient documentation

## 2015-04-20 DIAGNOSIS — Z862 Personal history of diseases of the blood and blood-forming organs and certain disorders involving the immune mechanism: Secondary | ICD-10-CM | POA: Diagnosis not present

## 2015-04-20 DIAGNOSIS — Z8739 Personal history of other diseases of the musculoskeletal system and connective tissue: Secondary | ICD-10-CM | POA: Diagnosis not present

## 2015-04-20 DIAGNOSIS — Z87891 Personal history of nicotine dependence: Secondary | ICD-10-CM | POA: Diagnosis not present

## 2015-04-20 DIAGNOSIS — Z79899 Other long term (current) drug therapy: Secondary | ICD-10-CM | POA: Insufficient documentation

## 2015-04-20 DIAGNOSIS — J45909 Unspecified asthma, uncomplicated: Secondary | ICD-10-CM | POA: Diagnosis not present

## 2015-04-20 DIAGNOSIS — F419 Anxiety disorder, unspecified: Secondary | ICD-10-CM | POA: Insufficient documentation

## 2015-04-20 DIAGNOSIS — R109 Unspecified abdominal pain: Secondary | ICD-10-CM

## 2015-04-20 DIAGNOSIS — Z8679 Personal history of other diseases of the circulatory system: Secondary | ICD-10-CM | POA: Insufficient documentation

## 2015-04-20 DIAGNOSIS — Z3202 Encounter for pregnancy test, result negative: Secondary | ICD-10-CM | POA: Insufficient documentation

## 2015-04-20 DIAGNOSIS — Q6589 Other specified congenital deformities of hip: Secondary | ICD-10-CM | POA: Diagnosis not present

## 2015-04-20 DIAGNOSIS — Z8669 Personal history of other diseases of the nervous system and sense organs: Secondary | ICD-10-CM | POA: Insufficient documentation

## 2015-04-20 DIAGNOSIS — N200 Calculus of kidney: Secondary | ICD-10-CM | POA: Diagnosis not present

## 2015-04-20 DIAGNOSIS — M797 Fibromyalgia: Secondary | ICD-10-CM | POA: Diagnosis present

## 2015-04-20 LAB — CBC WITH DIFFERENTIAL/PLATELET
Basophils Absolute: 0 10*3/uL (ref 0.0–0.1)
Basophils Relative: 0 % (ref 0–1)
Eosinophils Absolute: 0.1 10*3/uL (ref 0.0–0.7)
Eosinophils Relative: 1 % (ref 0–5)
HCT: 38.1 % (ref 36.0–46.0)
Hemoglobin: 12.9 g/dL (ref 12.0–15.0)
Lymphocytes Relative: 14 % (ref 12–46)
Lymphs Abs: 1.7 10*3/uL (ref 0.7–4.0)
MCH: 29.1 pg (ref 26.0–34.0)
MCHC: 33.9 g/dL (ref 30.0–36.0)
MCV: 86 fL (ref 78.0–100.0)
Monocytes Absolute: 0.8 10*3/uL (ref 0.1–1.0)
Monocytes Relative: 6 % (ref 3–12)
Neutro Abs: 9.4 10*3/uL — ABNORMAL HIGH (ref 1.7–7.7)
Neutrophils Relative %: 79 % — ABNORMAL HIGH (ref 43–77)
Platelets: 282 10*3/uL (ref 150–400)
RBC: 4.43 MIL/uL (ref 3.87–5.11)
RDW: 12.5 % (ref 11.5–15.5)
WBC: 11.9 10*3/uL — ABNORMAL HIGH (ref 4.0–10.5)

## 2015-04-20 LAB — COMPREHENSIVE METABOLIC PANEL
ALT: 26 U/L (ref 0–35)
AST: 28 U/L (ref 0–37)
Albumin: 4.1 g/dL (ref 3.5–5.2)
Alkaline Phosphatase: 60 U/L (ref 39–117)
Anion gap: 11 (ref 5–15)
BUN: 14 mg/dL (ref 6–23)
CO2: 24 mmol/L (ref 19–32)
Calcium: 9.3 mg/dL (ref 8.4–10.5)
Chloride: 102 mmol/L (ref 96–112)
Creatinine, Ser: 0.75 mg/dL (ref 0.50–1.10)
GFR calc Af Amer: >90 mL/min (ref >90)
GFR calc non Af Amer: >90 mL/min (ref >90)
Glucose, Bld: 94 mg/dL (ref 70–99)
Potassium: 3.9 mmol/L (ref 3.5–5.1)
Sodium: 137 mmol/L (ref 135–145)
Total Bilirubin: 0.7 mg/dL (ref 0.3–1.2)
Total Protein: 7.3 g/dL (ref 6.0–8.3)

## 2015-04-20 LAB — URINALYSIS, ROUTINE W REFLEX MICROSCOPIC
Glucose, UA: NEGATIVE mg/dL
Hgb urine dipstick: NEGATIVE
Ketones, ur: 15 mg/dL — AB
Nitrite: NEGATIVE
Protein, ur: NEGATIVE mg/dL
Specific Gravity, Urine: 1.023 (ref 1.005–1.030)
Urobilinogen, UA: 1 mg/dL (ref 0.0–1.0)
pH: 5.5 (ref 5.0–8.0)

## 2015-04-20 LAB — LIPASE, BLOOD: Lipase: 25 U/L (ref 11–59)

## 2015-04-20 LAB — URINE MICROSCOPIC-ADD ON

## 2015-04-20 LAB — POC URINE PREG, ED: Preg Test, Ur: NEGATIVE

## 2015-04-20 MED ORDER — ONDANSETRON HCL 4 MG/2ML IJ SOLN
4.0000 mg | Freq: Once | INTRAMUSCULAR | Status: AC
  Administered 2015-04-20: 4 mg via INTRAVENOUS
  Filled 2015-04-20: qty 2

## 2015-04-20 MED ORDER — HYDROCODONE-ACETAMINOPHEN 5-325 MG PO TABS: 1.0000 | ORAL_TABLET | Freq: Four times a day (QID) | ORAL | Status: DC | PRN

## 2015-04-20 MED ORDER — HYDROCODONE-ACETAMINOPHEN 5-325 MG PO TABS
1.0000 | ORAL_TABLET | Freq: Once | ORAL | Status: AC
  Administered 2015-04-20: 1 via ORAL
  Filled 2015-04-20: qty 1

## 2015-04-20 MED ORDER — SODIUM CHLORIDE 0.9 % IV BOLUS (SEPSIS)
1000.0000 mL | Freq: Once | INTRAVENOUS | Status: AC
  Administered 2015-04-20: 1000 mL via INTRAVENOUS

## 2015-04-20 MED ORDER — KETOROLAC TROMETHAMINE 30 MG/ML IJ SOLN
30.0000 mg | Freq: Once | INTRAMUSCULAR | Status: AC
  Administered 2015-04-20: 30 mg via INTRAVENOUS
  Filled 2015-04-20: qty 1

## 2015-04-20 MED ORDER — ONDANSETRON 4 MG PO TBDP: 4.0000 mg | ORAL_TABLET | Freq: Three times a day (TID) | ORAL | Status: DC | PRN

## 2015-04-20 MED ORDER — PROMETHAZINE HCL 25 MG/ML IJ SOLN
25.0000 mg | Freq: Once | INTRAMUSCULAR | Status: AC
  Administered 2015-04-20: 25 mg via INTRAVENOUS
  Filled 2015-04-20: qty 1

## 2015-04-20 NOTE — ED Notes (Signed)
Pt with hx of fibromyalgia and sts she "lost control of her pain today". sts she has been vomiting d/t pain. Pt does not have any pain meds. Pain is between shoulder blades and L side of abdomen specifically

## 2015-04-20 NOTE — ED Notes (Signed)
Pt reports she is having to force her urination.

## 2015-04-20 NOTE — Discharge Instructions (Signed)
Please follow up with your primary care physician in 1-2 days. If you do not have one please call the [facility] number listed above. Please take pain medication and/or muscle relaxants as prescribed and as needed for pain. Please do not drive on narcotic pain medication or on muscle relaxants. Please read all discharge instructions and return precautions.  ° °Kidney Stones °Kidney stones (urolithiasis) are deposits that form inside your kidneys. The intense pain is caused by the stone moving through the urinary tract. When the stone moves, the ureter goes into spasm around the stone. The stone is usually passed in the urine.  °CAUSES  °· A disorder that makes certain neck glands produce too much parathyroid hormone (primary hyperparathyroidism). °· A buildup of uric acid crystals, similar to gout in your joints. °· Narrowing (stricture) of the ureter. °· A kidney obstruction present at birth (congenital obstruction). °· Previous surgery on the kidney or ureters. °· Numerous kidney infections. °SYMPTOMS  °· Feeling sick to your stomach (nauseous). °· Throwing up (vomiting). °· Blood in the urine (hematuria). °· Pain that usually spreads (radiates) to the groin. °· Frequency or urgency of urination. °DIAGNOSIS  °· Taking a history and physical exam. °· Blood or urine tests. °· CT scan. °· Occasionally, an examination of the inside of the urinary bladder (cystoscopy) is performed. °TREATMENT  °· Observation. °· Increasing your fluid intake. °· Extracorporeal shock wave lithotripsy--This is a noninvasive procedure that uses shock waves to break up kidney stones. °· Surgery may be needed if you have severe pain or persistent obstruction. There are various surgical procedures. Most of the procedures are performed with the use of small instruments. Only small incisions are needed to accommodate these instruments, so recovery time is minimized. °The size, location, and chemical composition are all  important variables that will determine the proper choice of action for you. Talk to your health care provider to better understand your situation so that you will minimize the risk of injury to yourself and your kidney.  °HOME CARE INSTRUCTIONS  °· Drink enough water and fluids to keep your urine clear or pale yellow. This will help you to pass the stone or stone fragments. °· Strain all urine through the provided strainer. Keep all particulate matter and stones for your health care provider to see. The stone causing the pain may be as small as a grain of salt. It is very important to use the strainer each and every time you pass your urine. The collection of your stone will allow your health care provider to analyze it and verify that a stone has actually passed. The stone analysis will often identify what you can do to reduce the incidence of recurrences. °· Only take over-the-counter or prescription medicines for pain, discomfort, or fever as directed by your health care provider. °· Make a follow-up appointment with your health care provider as directed. °· Get follow-up X-rays if required. The absence of pain does not always mean that the stone has passed. It may have only stopped moving. If the urine remains completely obstructed, it can cause loss of kidney function or even complete destruction of the kidney. It is your responsibility to make sure X-rays and follow-ups are completed. Ultrasounds of the kidney can show blockages and the status of the kidney. Ultrasounds are not associated with any radiation and can be performed easily in a matter of minutes. °SEEK MEDICAL CARE IF: °· You experience pain that is progressive and unresponsive to   any pain medicine you have been prescribed. °SEEK IMMEDIATE MEDICAL CARE IF:  °· Pain cannot be controlled with the prescribed medicine. °· You have a fever or shaking chills. °· The severity or intensity of pain increases over 18 hours and is not relieved by pain  medicine. °· You develop a new onset of abdominal pain. °· You feel faint or pass out. °· You are unable to urinate. °MAKE SURE YOU:  °· Understand these instructions. °· Will watch your condition. °· Will get help right away if you are not doing well or get worse. °Document Released: 12/17/2005 Document Revised: 08/19/2013 Document Reviewed: 05/20/2013 °[employer]® Patient Information ©2015 [employer]. This information is not intended to replace advice given to you by your health care provider. Make sure you discuss any questions you have with your health care provider. ° ° ° °

## 2015-04-20 NOTE — ED Provider Notes (Signed)
CSN: [id number]     Arrival date & time 04/20/15  1705 History   First MD Initiated Contact with Patient 04/20/15 1755     Chief Complaint  Patient presents with  . Fibromyalgia     (Consider location/radiation/quality/duration/timing/severity/associated sxs/prior Treatment) HPI Comments: Patient is a G4 P18 25 year old female past medical history significant for IBS, depression, anxiety, fibromyalgia presenting to the emergency department for evaluation of left sided and umbilical abdominal pain with radiation to back associated nausea, 4 episodes of nonbloody nonbilious vomiting, difficulty urinating that started at 3 PM this afternoon. No medication chart prior to arrival. No modifying factors identified.    Past Medical History  Diagnosis Date  . Hip dysplasia   . Sacroiliac joint dysfunction of both sides   . IBS (irritable bowel syndrome)   . Normal pregnancy 06/15/2012  . Depression   . Anxiety   . Asthma     "very mild"  . Complication of anesthesia     convulsion with epidural redose  . IDA (iron deficiency anemia)   . Cardiac dysrhythmia, unspecified   . Migraine, unspecified, without mention of intractable migraine without mention of status migrainosus    Past Surgical History  Procedure Laterality Date  . Laparoscopic tubal ligation  09/16/2012    Procedure: LAPAROSCOPIC TUBAL LIGATION;  Surgeon: [name], MD;  Location: [facility];  Service: Gynecology;  Laterality: Bilateral;   Family History  Problem Relation Age of Onset  . Diabetes Mother   . Arthritis Mother   . Kidney disease Mother   . Mental illness Mother   . Fibromyalgia Mother   . Heart disease Father   . Diabetes Father   . Hypertension Father   . Hyperlipidemia Father   . Mental illness Father     ptsd and bipolar  . Heart attack Father   . Depression Father   . Heart disease Paternal Uncle   . Diabetes Maternal Grandmother   . Arthritis Maternal Grandmother   . Cancer Paternal  Grandmother     colon  . Hyperlipidemia Paternal Grandmother   . Hypertension Paternal Grandmother   . Diabetes Paternal Grandfather   . Hyperlipidemia Paternal Grandfather   . Hypertension Paternal Grandfather   . Mental illness Paternal Grandfather   . Heart disease Paternal Grandfather   . Other Neg Hx   . Migraines Mother   . Osteoporosis Mother    History  Substance Use Topics  . Smoking status: Former Smoker -- 2 years    Types: Cigarettes    Quit date: 06/05/2008  . Smokeless tobacco: Never Used  . Alcohol Use: Yes     Comment: occasional    OB History    Gravida Para Term Preterm AB TAB SAB Ectopic Multiple Living   0 0 0 0 0 0 2     Review of Systems  Gastrointestinal: Positive for nausea, vomiting and abdominal pain.  Genitourinary: Positive for difficulty urinating.  All other systems reviewed and are negative.     Allergies  Other  Home Medications   Prior to Admission medications   Medication Sig Start Date End Date Taking? Authorizing Provider  albuterol (PROVENTIL HFA;VENTOLIN HFA) 108 (90 BASE) MCG/ACT inhaler Inhale 2 puffs into the lungs every 6 (six) hours as needed for wheezing or shortness of breath.   Yes Historical Provider, MD  benztropine (COGENTIN) 1 MG tablet Take 1-2 mg by mouth 2 (two) times daily. Take 1 mg every morning and 2 mg every  evening   Yes Historical Provider, MD  desvenlafaxine (PRISTIQ) 50 MG 24 hr tablet Take 50 mg by mouth daily.   Yes Historical Provider, MD  paliperidone (INVEGA) 3 MG 24 hr tablet Take 3 mg by mouth daily. 03/04/15  Yes Historical Provider, MD  ALPRAZolam Prudy Feeler) 0.5 MG tablet TAKE 1 TABLET BY MOUTH THREE TIMES DAILY AS NEEDED FOR ANXIETY Patient not taking: Reported on 04/20/2015 10/05/14   Sheliah Hatch, MD  DULoxetine (CYMBALTA) 30 MG capsule Take 1 capsule (30 mg total) by mouth daily. Patient not taking: Reported on 04/20/2015 10/05/14   Sheliah Hatch, MD  HYDROcodone-acetaminophen  (NORCO/VICODIN) 5-325 MG per tablet Take 1-2 tablets by mouth every 6 (six) hours as needed. 04/20/15   Denys Salinger, PA-C  meloxicam (MOBIC) 15 MG tablet Take 1 tablet (15 mg total) by mouth daily. Patient not taking: Reported on 04/20/2015 11/09/14   Sheliah Hatch, MD  ondansetron (ZOFRAN ODT) 4 MG disintegrating tablet Take 1 tablet (4 mg total) by mouth every 8 (eight) hours as needed for nausea or vomiting. 04/20/15   Francee Piccolo, PA-C  topiramate (TOPAMAX) 25 MG tablet Take 1 tab daily x 2 weeks, then increase to 2 tab daily. Patient not taking: Reported on 04/20/2015 07/09/14   Donika K Patel, DO   BP 98/65 mmHg  Pulse 74  Temp(Src) 98 F (36.7 C) (Oral)  Resp 20  Ht  (1.626 m)  Wt 227 lb (102.967 kg)  BMI 38.95 kg/m2  SpO2 99%  LMP 03/11/2015 Physical Exam  Constitutional: She is oriented to person, place, and time. She appears well-developed and well-nourished. No distress.  HENT:  Head: Normocephalic and atraumatic.  Right Ear: External ear normal.  Left Ear: External ear normal.  Nose: Nose normal.  Mouth/Throat: Oropharynx is clear and moist.  Eyes: Conjunctivae are normal.  Neck: Normal range of motion. Neck supple.  No nuchal rigidity.   Cardiovascular: Normal rate, regular rhythm and normal heart sounds.   Pulmonary/Chest: Effort normal and breath sounds normal. No respiratory distress.  Abdominal: Soft. Bowel sounds are normal. She exhibits no distension. There is no tenderness. There is no rigidity, no rebound, no guarding and no CVA tenderness.  Musculoskeletal: Normal range of motion. She exhibits no edema.  Neurological: She is alert and oriented to person, place, and time.  Skin: Skin is warm and dry. She is not diaphoretic.  Psychiatric: She has a normal mood and affect.  Nursing note and vitals reviewed.   ED Course  Procedures (including critical care time) Medications  sodium chloride 0.9 % bolus 1,000 mL (0 mLs Intravenous  Stopped 04/20/15 2254)  ketorolac (TORADOL) 30 MG/ML injection 30 mg (30 mg Intravenous Given 04/20/15 2120)  ondansetron (ZOFRAN) injection 4 mg (4 mg Intravenous Given 04/20/15 2128)  promethazine (PHENERGAN) injection 25 mg (25 mg Intravenous Given 04/20/15 2227)  HYDROcodone-acetaminophen (NORCO/VICODIN) 5-325 MG per tablet 1 tablet (1 tablet Oral Given 04/20/15 2257)    Labs Review Labs Reviewed  URINALYSIS, ROUTINE W REFLEX MICROSCOPIC - Abnormal; Notable for the following:    Color, Urine AMBER (*)    APPearance HAZY (*)    Bilirubin Urine SMALL (*)    Ketones, ur 15 (*)    Leukocytes, UA SMALL (*)    All other components within normal limits  CBC WITH DIFFERENTIAL/PLATELET - Abnormal; Notable for the following:    WBC 11.9 (*)    Neutrophils Relative % 79 (*)    Neutro Abs 9.4 (*)  All other components within normal limits  URINE MICROSCOPIC-ADD ON - Abnormal; Notable for the following:    Squamous Epithelial / LPF MANY (*)    Bacteria, UA FEW (*)    All other components within normal limits  COMPREHENSIVE METABOLIC PANEL  LIPASE, BLOOD  POC URINE PREG, ED    Imaging Review Ct Renal Stone Study  04/20/2015   CLINICAL DATA:  Abdominal pain  EXAM: CT ABDOMEN AND PELVIS WITHOUT CONTRAST  TECHNIQUE: Multidetector CT imaging of the abdomen and pelvis was performed following the standard protocol without IV contrast.  COMPARISON:  None.  FINDINGS: Lung bases are free of acute infiltrate or sizable effusion.  The liver, spleen, gallbladder, adrenal glands and pancreas are within normal limits. Tiny nonobstructing renal stones are noted on the right. The collecting systems and ureters appear nondilated. A tiny 1-2 mm stone is noted at the left UVJ without significant obstructive changes. This could be the etiology of the patient's discomfort. The bladder is well distended with non-opacified urine. No pelvic mass lesion is seen. Multiple phleboliths are noted.  The appendix is within  normal limits. No pelvic mass lesion is seen. The osseous structures are grossly unremarkable.  IMPRESSION: Tiny nonobstructing right renal stones.  1-2 mm stone at the left UVJ without significant obstructive change. This may be the etiology of the patient's discomfort.   Electronically Signed   By: Alcide CleverMark  Lukens M.D.   On: 04/20/2015 21:28     EKG Interpretation None      MDM   Final diagnoses:  Acute abdominal pain  Kidney stone    Filed Vitals:   04/20/15 2300  BP: 98/65  Pulse: 74  Temp:   Resp:    Afebrile, NAD, non-toxic appearing, AAOx4.   Pt has been diagnosed with a Kidney Stone via CT. There is no evidence of significant hydronephrosis, serum creatine WNL, vitals sign stable and the pt does not have irratractable vomiting. Pt will be dc home with pain medications & has been advised to follow up with PCP. Patient is stable at time of discharge. Patient d/w with Dr. Manus Gunningancour, agrees with plan.       Francee PiccoloJennifer Cardell Rachel, PA-C 04/21/15 0045  Glynn OctaveStephen Rancour, MD 04/21/15 [phone]

## 2015-04-21 ENCOUNTER — Telehealth: Payer: Self-pay | Admitting: *Deleted

## 2015-04-21 NOTE — Telephone Encounter (Signed)
Teresa LinemanStephanie S Bartlett  Teresa Bartlett Teresa Naidelin Gugliotta, RN            ED follow up  [facility]  appt on 04/25/15     Noted and agree with plan.

## 2015-04-25 ENCOUNTER — Ambulatory Visit (INDEPENDENT_AMBULATORY_CARE_PROVIDER_SITE_OTHER): Payer: BLUE CROSS/BLUE SHIELD | Admitting: Family Medicine

## 2015-04-25 ENCOUNTER — Encounter: Payer: Self-pay | Admitting: Family Medicine

## 2015-04-25 VITALS — BP 128/80 | HR 96 | Temp 98.0°F | Resp 16 | Wt 229.5 lb

## 2015-04-25 DIAGNOSIS — N200 Calculus of kidney: Secondary | ICD-10-CM | POA: Diagnosis not present

## 2015-04-25 DIAGNOSIS — M797 Fibromyalgia: Secondary | ICD-10-CM | POA: Diagnosis not present

## 2015-04-25 MED ORDER — PREGABALIN 75 MG PO CAPS: 75.0000 mg | ORAL_CAPSULE | Freq: Two times a day (BID) | ORAL | Status: DC

## 2015-04-25 NOTE — Progress Notes (Signed)
Pre visit review using our clinic review tool, if applicable. No additional management support is needed unless otherwise documented below in the visit note. 

## 2015-04-25 NOTE — Progress Notes (Signed)
   Subjective:    Patient ID: Teresa Bartlett, female    DOB: Jul 27, 1990, 25 y.o.   MRN: [id number]  HPI ER f/u- went to ER on 4/20 and was dx'd w/ kidney stone.  Stone passed on Saturday.  No bleeding.  Pain has completely resolved.  Continues to have low grade nausea but pt thinks this is due to pain medications.  Pt reports things are going well w/ exception of 'all over pain'.  Pt has seen ortho for various issues but continues to have transient, migratory pain.   Review of Systems For ROS see HPI     Objective:   Physical Exam  Constitutional: She is oriented to person, place, and time. She appears well-developed and well-nourished. No distress.  HENT:  Head: Normocephalic and atraumatic.  Eyes: Conjunctivae and EOM are normal. Pupils are equal, round, and reactive to light.  Neck: Normal range of motion. Neck supple.  Abdominal: Soft. She exhibits no distension. There is no tenderness. There is no rebound and no guarding.  Lymphadenopathy:    She has no cervical adenopathy.  Neurological: She is alert and oriented to person, place, and time.  Skin: Skin is warm and dry.  Psychiatric: She has a normal mood and affect. Her behavior is normal. Thought content normal.  Vitals reviewed.         Assessment & Plan:

## 2015-04-25 NOTE — Patient Instructions (Signed)
Follow up in 6-8 weeks to recheck pain Start the Lyrica twice daily for the fibromyalgia pain Call with any questions or concerns Keep up the good work!!!

## 2015-04-26 NOTE — Assessment & Plan Note (Signed)
New to provider, dx'd in ER.  Pt passed stone over weekend and reports pain spontaneously improved.  Cautioned as to risk of recurrence and need for increased water intake.

## 2015-04-26 NOTE — Assessment & Plan Note (Signed)
Ongoing issue for pt.  She has reacted poorly to Cymbalta in the past.  Due to recurring, transient, migratory pain will start Lyrica and monitor for improvement.  Pt expressed understanding and is in agreement w/ plan.

## 2015-06-20 ENCOUNTER — Encounter (HOSPITAL_COMMUNITY): Payer: Self-pay | Admitting: Licensed Clinical Social Worker

## 2015-06-20 ENCOUNTER — Other Ambulatory Visit (HOSPITAL_COMMUNITY): Payer: BLUE CROSS/BLUE SHIELD | Attending: Psychiatry | Admitting: Licensed Clinical Social Worker

## 2015-06-20 DIAGNOSIS — F4001 Agoraphobia with panic disorder: Secondary | ICD-10-CM

## 2015-06-20 DIAGNOSIS — F603 Borderline personality disorder: Secondary | ICD-10-CM | POA: Diagnosis not present

## 2015-06-20 DIAGNOSIS — F4310 Post-traumatic stress disorder, unspecified: Secondary | ICD-10-CM

## 2015-06-20 DIAGNOSIS — F410 Panic disorder [episodic paroxysmal anxiety] without agoraphobia: Secondary | ICD-10-CM | POA: Insufficient documentation

## 2015-06-20 DIAGNOSIS — F3164 Bipolar disorder, current episode mixed, severe, with psychotic features: Secondary | ICD-10-CM

## 2015-06-20 DIAGNOSIS — F333 Major depressive disorder, recurrent, severe with psychotic symptoms: Secondary | ICD-10-CM | POA: Insufficient documentation
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
## 2015-06-21 ENCOUNTER — Ambulatory Visit (HOSPITAL_COMMUNITY): Payer: BLUE CROSS/BLUE SHIELD | Admitting: Psychiatry

## 2015-06-21 DIAGNOSIS — F333 Major depressive disorder, recurrent, severe with psychotic symptoms: Secondary | ICD-10-CM | POA: Diagnosis not present

## 2015-06-21 NOTE — Psych (Signed)
[facility] PHP THERAPIST PROGRESS NOTE  Teresa Bartlett [id number]    Date: 06/21/15  Time: 11:00 AM-12:00   Group Topic/Focus:  Relaxation Exercise/Discussion of strategies that are effective for anxiety and panic  Participation Level:  Active  Participation Quality:  Appropriate, Attentive and Sharing  Affect:  Appropriate  Cognitive:  Alert and Appropriate  Insight: Lacking  Engagement in Group:  Engaged  Modes of Intervention:  Activity, Discussion, Education, Exploration, Problem-solving and Coping Skills For Anxiety and Depression  Additional Comments:  Group topic involved guided relaxation exercise titled "Sanctuary". Group members also shared about strategies they have found effective for their anxiety and panic. Group started with a check in. Teresa Bartlett shared that she had a difficult day yesterday where she spiraled downward, tore up skin, anxious and not the best place mentally. In discussion she shared helpful strategy of mindfulness she uses to manage anxiety and how you use your five senses to be present and can be done anywhere. This was helpful as it could be used in situations where you feel anxious. Group members identified their animals as helping them to relax. We discussed exercise and all group members have used exercise and recognize that it is an effective coping strategy. Other strategies included deep breathing and music. We discussed that sleep was extremely important to health and needing the right amount so that you allowed your body to go through all the sleep cycle. Teresa Bartlett related that visual relaxation exercise was not a good one for her as it can lead her to dissociate. She said she has been able to relax in lying on the flow and focusing on one thing such as her breath.   Suicidal/Homicidal: no  Plan: 1.Teresa Bartlett practice strategies to help manage anxiety and panic.2.Patient take steps to build a healthy lifestyle.    [facility] PHP THERAPIST GROUP NOTE  Date:  06/21/15 Time: 12:30 PM-1:30 PM  Group Topic/Focus:  Presentation of video by Blair Promise "How to Break the Habit of Excessive Thought" and discussion  Participation Level:  Active  Participation Quality:  Appropriate and Sharing  Affect:  Appropriate  Cognitive:  Appropriate  Insight: Lacking  Engagement in Group:  Engaged  Modes of Intervention:  Activity, Discussion, Education, Exploration and Problem-solving, Coping Strategies for depression/anxiety  Additional Comments: Therapist presented video of "How to Break the Habit of Excessive Thought" by Blair Promise. The video discussed how thoughts have a magnetic pull that pulls you to a place you did not even image you would end up. Therapist tied this to how our thoughts in this way can lead [place] on a trail that leads to anxiety and depression. In discussion, group members pointed out that they have learned that anxiety thoughts are thoughts about the future, and depression thoughts are thoughts about the past that keep [place] from being present. Therapist said that not following one thought to another but slowing down our active mind and practicing presence or being in the present moment when thinking is not primary will help [place] cut [place] off from unhelpful habits or trails of thought that lead to negative emotions. In discussion ways to get to present moment, the video discussed how the five senses and listening without the interference of concepts helps to bring [place] back to the present. Patient participated appropriately though sharing and  respectively listening.   Suicidal/Homicidal: no  Plan:1.Patient work on unhealthy habits of thinking that lead to negative emotion.2.Therapist educate patient on effective coping strategies for mental health symptoms.  Diagnosis: Primary Diagnosis: Bipolar I disorder, most recent episode mixed, severe with psychotic features [F31.64]    1. Bipolar I disorder, most recent episode mixed, severe with  psychotic features   2. Panic disorder with agoraphobia   3. PTSD (post-traumatic stress disorder)       Khaden Gater A 06/21/2015

## 2015-06-21 NOTE — Progress Notes (Signed)
  Group Progress Note  Program: PHP  Group Time: 1.38pm-2.35pm  Participation Level: Active  Behavioral Response: Appropriate and Sharing  Type of Therapy:  yoga Psycho-education skills Group  Summary of Progress: Counselor led group through yoga practice for energizing with focus on mindful movement, mindful of sensations and mindful of breath.  Provided psychoeducation re: benefits of poses and benefits to parasympathetic system.  Use of imagery of light for mental and physical health benefits. Processed w/ group response to practice.  Pt joined group 8 minutes in.  Pt shared some shoulder injury hx.  Pt fully participated and listened her her needs- taking alternative pose when needed. Pt shared that she has some experience with yoga and enjoys practice for self and found benefit with today.  Pt expressed interest in further yoga practice w/ PHP program.    Forde Radon, [facility] 06/21/15

## 2015-06-22 ENCOUNTER — Other Ambulatory Visit (HOSPITAL_COMMUNITY): Payer: BLUE CROSS/BLUE SHIELD | Admitting: Licensed Clinical Social Worker

## 2015-06-22 DIAGNOSIS — F3164 Bipolar disorder, current episode mixed, severe, with psychotic features: Secondary | ICD-10-CM

## 2015-06-22 DIAGNOSIS — F4001 Agoraphobia with panic disorder: Secondary | ICD-10-CM

## 2015-06-22 DIAGNOSIS — F4310 Post-traumatic stress disorder, unspecified: Secondary | ICD-10-CM

## 2015-06-22 DIAGNOSIS — F333 Major depressive disorder, recurrent, severe with psychotic symptoms: Secondary | ICD-10-CM | POA: Diagnosis not present

## 2015-06-22 NOTE — Progress Notes (Addendum)
Recreation Therapy Note   Date: 06/22/15 Time: 1330-1500 Location: PHP Group Room #1  Group Topic: Life Skills  Goal (s): Patient will successfully participate in interactive activity stating 5 affirmations.   Patient will verbalize understanding of importance of a strong self-esteem in relapse prevention.  Behavioral Response: Engaged, responsive, supportive  Activity: I AM ..Marland Kitchen Affirmations  Patient participated in the group by identifying positive attributes about herself using the I AM ... phrasing.  Pt stated that she was adventurous, artistic, determined, steady, and loving.  Pt participated in the Qigong breathing technique to decrease anxiety and stress.  Pt appeared to understand the importance of using affirmations in cultivating a strong self-esteem.  Pt shared with the group that she plans to finish her "Affirmation Jar", make 3 difficult phone calls, and ask discuss with her husband and in-laws about the possibility of getting her children to come back home.  Pt shared that she misses them very much, and at the same time, understands the importance of her own health and well-being.  Pt was able to receive support from staff and peers in practicing relaxation techniques before making the phone calls.  Pt appeared very willing and receptive to treatment and in asking for support from [facility] staff.  Gwyndolyn Kaufman, LRT/CTRS

## 2015-06-22 NOTE — Progress Notes (Signed)
Date: June 21, 2015 Time: 2:30 PM-4:00 PM  Purpose: To address withdrawal from social interaction as well as negative experiences when isolating. Intervention: Exercised concepts of DBT to investigate the possible resolution between clients have healthy relationships with themselves and building effective support systems as they visualize and idealize. Effect: She related presence of negative thought patterns when facing environments outside of her home. Concluded that Parital group is effective in creating her new "comfort zone" that she may apply to other social opportunities.  Donnamarie Rossetti CPSS

## 2015-06-22 NOTE — Psych (Signed)
[facility] BH PHP THERAPIST PROGRESS NOTE  Chantrell Motamedi [id number]   Date: 06/22/15  Time: 11:00 AM-12:00 PM  Group Topic/Focus:  Discussion of Worksheet Observing and Describing Emotions and Function of Emotions to help patients build helpful coping strategies    Participation Level:  Active  Participation Quality:  Appropriate and Sharing  Affect:  Appropriate  Cognitive:  Appropriate  Insight: Appropriate  Engagement in Group:  Engaged  Modes of Intervention:  Activity, Discussion, Education, Exploration, Problem-solving and DBT-Coping skills for managing emotions  Additional Comments: Group topic related to DBT worksheet "Observing and Describing Emotions". Group started with a check in. Therapist explained that in order to be able to manage emotions, you have to learn to keep track and observe them when they happen. When they happen, you record to the best of your ability the prompting event, interpretation, body changes and sensing, action urge, what you said or did in the situation and what after effect does the emotion have on you. By observing at the time you experience your emotion, you can uncover the underlying causes and make a better choice of coping strategy. In observing, you also learn to be separate from your emotions and being separate also helps you have to ave better control. Therapist discussed as well functions of emotions and how understanding functions also helps you to have better control of emotions. Therapist explained that action urge is an attempt to problem solve the discomfort of an internal response and one can also make more helpful choices in managing the discomfort of emotions that are less destructive. This activity was helpful for Moses in reinforcing his insight that there are different ways he can intervene to manage emotions and recognizing he can make less destructive choices in managing his emotions.  Suicidal/Homicidal: no  Plan: 1.Patient  learn and apply more helpful coping strategies to manage emotions.2.Patient take steps to create a healthy lifestyle.  [facility] BH PHP THERAPIST GROUP NOTE  Date: 06/22/15 Time: 12:30 PM-1:30 PM   Group Topic/Focus:  Ted Ed video titled "Three tips to Boost Your Self-Confidence" and discussion  Participation Level:  Active  Participation Quality:  Attentive  Affect:  Appropriate  Cognitive:  Appropriate  Insight: Limited  Engagement in Group:  Engaged  Modes of Intervention:  Activity, Discussion, Education, Exploration and Coping Strategies to help Build Confidence and Manage Challenges  Additional Comments: Therapist showed a Ted Ed video titled "Three tips to Boost Your Self-Confidence". The tips included picturing you success when beginning a difficult task and it can be as simple as listening to music with a deep base, strike a powerful pose or give yourself a pep talk. Tip two was believing in your ability to improve. These beliefs matter because they impact how you act when faced with setback. If you have a fixed mindset and think your talents are locked in place, you might give up assuming that you have discovered something you have not good at. If you have a growth mindset and think your abilities can improve, challenges are an opportunity to learn and grow. The connections in your brain grow with study and practice. Tip three was practice failure. We all are going to fail sometimes. Studies show that those who fail regularly but keep trying anyway are better equipped with faced with challenges and setbacks to respond to challenges in a constructive. They learn to try other strategies, ask others for advice and persevere. This perspective is helpful for patient in applying to challenges such as  managing her mental health and recognizing skills she will gain. She has to accept you are going to make mistakes, the challenge is not going to always be easy, be kind to yourself, and keep moving  forward to your goal with the reward of knowledge and understanding. This activity was helpful for patient in learning helpful and healthy ways to manage challenges.   Suicidal/Homicidal: no  Plan: 1.Patient apply helpful mindset to look at obstacles as challenges to learn and grow. 2.Therapist educate patient on coping strategies to help manage stressors and take positive steps to develop a healthy lifestyle.   Diagnosis: Primary Diagnosis: Bipolar I disorder, most recent episode mixed, severe with psychotic features [F31.64]    1. Bipolar I disorder, most recent episode mixed, severe with psychotic features   2. Panic disorder with agoraphobia   3. PTSD (post-traumatic stress disorder)       Bowman,Mary A 06/22/2015

## 2015-06-22 NOTE — Progress Notes (Signed)
Adult Psychoeducational Group Note  Date:  06/22/2015 Time:  4:21 PM  Group Topic/Focus: Orientation:   The focus of this group is to educate the patient on the purpose and policies of crisis stabilization and provide a format to answer questions about their admission.  The group details unit policies and expectations of patients while admitted.  Participation Level:  Active  Participation Quality:  Sharing  Affect:  Appropriate  Cognitive:  Alert  Insight: Good  Engagement in Group:  Engaged  Modes of Intervention:  Discussion, Exploration, Orientation and Socialization  Additional Comments:  Therapist attempted to establish rapport with patients through reflective listening and asking permission before providing information or advice. Directive, client-centered, empathic, and motivation-enhancing treatment interventions were utilized. Therapist utilized open-ended questioning to assist patients with exploring their motivation to change these impulsive behaviors. Their responses were processed within the group.  Therapist connected comments to demonstrate to patients that their impulsivity has resulted in a life of instability and negative consequences for themselves and others.   Harmonie Verrastro 06/22/2015, 4:21 PM

## 2015-06-23 ENCOUNTER — Other Ambulatory Visit (HOSPITAL_COMMUNITY): Payer: BLUE CROSS/BLUE SHIELD | Admitting: Licensed Clinical Social Worker

## 2015-06-23 DIAGNOSIS — F333 Major depressive disorder, recurrent, severe with psychotic symptoms: Secondary | ICD-10-CM | POA: Diagnosis not present

## 2015-06-23 DIAGNOSIS — F4310 Post-traumatic stress disorder, unspecified: Secondary | ICD-10-CM

## 2015-06-23 DIAGNOSIS — F4001 Agoraphobia with panic disorder: Secondary | ICD-10-CM

## 2015-06-23 DIAGNOSIS — F3164 Bipolar disorder, current episode mixed, severe, with psychotic features: Secondary | ICD-10-CM

## 2015-06-23 NOTE — Progress Notes (Signed)
Adult Psychoeducational Group Note  Date:  06/23/2015 Time:  4:37 PM  Group Topic/Focus: Building Self Esteem:   The Focus of this group is helping patients become aware of the effects of self-esteem on their lives, the things they and others do that enhance or undermine their self-esteem, seeing the relationship between their level of self-esteem and the choices they make and learning ways to enhance self-esteem. Emotional Education:   The focus of this group is to discuss what feelings/emotions are, and how they are experienced.  Participation Level:  Active  Participation Quality:  Appropriate  Affect:  Appropriate  Cognitive:  Alert  Insight: Limited  Engagement in Group:  Developing/Improving  Modes of Intervention:  Activity, Clarification, Discussion, Education, Exploration and Problem-solving  Additional Comments:  Patients were asked to discuss a history of feeling inadequate compared with others dating back to childhood. Many of the patients gave a self-description which included self-critical statements, lack of confidence in abilities, and social withdrawal. Therapist was supported each patient as they shared experiences from their family-of-origin history that have caused feelings of low self-esteem.  Therapist explored statements based on low self-esteem within the session.  Therapist assessed for statements that were self-disparaging, indicated feelings of low self-esteem, lack self- confidence, and placed the patient in a situation in which they were vulnerable to depression. Therapist actively listed and helped patients to reframe negative perceptions of themselves.  Therapist supported patients as they revealed experiences with critical and rejecting family members, friends, neighbors, co-workers, classmates and strangers that led to feelings of low self-esteem.  Teresa Bartlett 06/23/2015, 4:37 PM

## 2015-06-23 NOTE — Addendum Note (Signed)
Addended by: Nehemiah Massed A on: 06/23/2015 06:30 PM   Modules accepted: Orders, SmartSet

## 2015-06-23 NOTE — Psych (Signed)
   [facility] BH PHP THERAPIST PROGRESS NOTE  Teresa Bartlett [id number]    Date: 06/22/15 Time: 11:00 AM-12:30 PM   Group Topic/Focus:  Practices That Help Create Positive Mood Through Building Positive Experiences/Socializing Relationship Building Within The Group  Participation Level:  Active  Participation Quality:  Sharing  Affect:  Appropriate  Cognitive:  Appropriate  Insight: Lacking  Engagement in Group:  Engaged  Modes of Intervention:  Discussion, Education, Exploration and Coping Through Positive Activities  Additional Comments: Patients discussed hobbies, recreational activities, social activities, returning to work and diversion activities. Group began with a check in. Group members shared interest in activities and this helped to build camaraderie in the group. Group discussed how engaging in activities helps to take the focus on themselves and redirect it toward such things as hobbies that they enjoy. Group discussed how engaging in various activities helps to create positive experiences to improve mood and a strategy to overcome negative thought processes. Patient participated and shared about her interests and discussed her plan to exercise with her husband today. Therapist said that the effort she puts into taking positive steps such as this will help in improving mood.   Suicidal/Homicidal: no  Plan: 1.Patient continue to improve in mood and anxiety through engagement in positive activities.2.Patient continue to take steps to create a healthy lifestyle.    [facility] BH PHP THERAPIST GROUP NOTE  Date: 06/22/15   Time: 1:00 PM-2:00 PM   Group Topic/Focus:  The group topic related to managing psychotic symptoms and to strategies to manage negative self-talk.  Participation Level:  Active  Participation Quality:  Attentive  Affect:  Appropriate  Cognitive:  Appropriate  Insight: Lacking  Engagement in Group:  Engaged  Modes of Intervention:  Activity,  Education, Exploration and Coping  Additional Comments: The group topic related to managing psychotic symptoms and to strategies to manage negative self-talk. There was a recognition by group of sharing the feeling of having lack of control over the mind that had led to mental health symptoms. Through input from the group, it was agreed that starting to have control over little things would help them to feel better and lead to feeling more control of bigger things. For example, making an effort in the morning to take care of themselves would lead to a good start of the day and feeling better. This was the same with negative self-talk. They could use positive self-talk and start with smaller things as a way to start to build self-confidence. Therapist pointed out that progress in self-esteem would require patients to put energy and commitment into goal to incorporate suggestions. Patient was active participant and contributed many of suggestions for coping strategies to manage symptoms.   Suicidal/Homicidal: no  Plan: 1.Patient implement coping strategies to continue to improve symptoms.2.Patient continue to build foundation for his recovery.   Diagnosis: Primary Diagnosis: Bipolar I disorder, most recent episode mixed, severe with psychotic features [F31.64]    1. Bipolar I disorder, most recent episode mixed, severe with psychotic features   2. Panic disorder with agoraphobia   3. PTSD (post-traumatic stress disorder)       Bartlett,Teresa A 06/23/2015

## 2015-06-23 NOTE — Progress Notes (Signed)
Date: June 23, 2015 Time of Encounter: 3:00 PM-3:15 PM  Purpose: To address paranoia and anxiety when facing (auditory & visual) hallucinations and psychosis. Intervention: She related that she was having recent difficulty discerning reality due to the presence of visual hallucinations. She also stated that she only recently realized that internal voices that she hears were auditory hallucinations. She was asked to do a personal inventory of both her current support system. She also listed upon request grounding elements that she presently utilizes to combat paranoia and anxiety. Effect: She concluded that she needs to expand her current support system and find those whom she can trust to have open & honest communication. She related that learning about her diagnosis in the Partial Group is of assistance in giving her perspective on reality.  Donnamarie Rossetti CPSS
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
## 2015-06-24 ENCOUNTER — Encounter (HOSPITAL_COMMUNITY): Payer: Self-pay | Admitting: Emergency Medicine

## 2015-06-24 ENCOUNTER — Telehealth: Payer: Self-pay | Admitting: Neurology

## 2015-06-24 ENCOUNTER — Emergency Department (INDEPENDENT_AMBULATORY_CARE_PROVIDER_SITE_OTHER)
Admission: EM | Admit: 2015-06-24 | Discharge: 2015-06-24 | Disposition: A | Discharge status: Home / Self Care | Payer: BLUE CROSS/BLUE SHIELD | Source: Home / Self Care | Attending: Family Medicine | Admitting: Family Medicine

## 2015-06-24 ENCOUNTER — Other Ambulatory Visit (HOSPITAL_COMMUNITY): Payer: BLUE CROSS/BLUE SHIELD | Admitting: Licensed Clinical Social Worker

## 2015-06-24 DIAGNOSIS — G43909 Migraine, unspecified, not intractable, without status migrainosus: Secondary | ICD-10-CM

## 2015-06-24 DIAGNOSIS — F333 Major depressive disorder, recurrent, severe with psychotic symptoms: Secondary | ICD-10-CM | POA: Diagnosis not present

## 2015-06-24 DIAGNOSIS — F3164 Bipolar disorder, current episode mixed, severe, with psychotic features: Secondary | ICD-10-CM

## 2015-06-24 DIAGNOSIS — F4001 Agoraphobia with panic disorder: Secondary | ICD-10-CM

## 2015-06-24 DIAGNOSIS — F4310 Post-traumatic stress disorder, unspecified: Secondary | ICD-10-CM

## 2015-06-24 MED ORDER — METOCLOPRAMIDE HCL 5 MG/ML IJ SOLN
5.0000 mg | Freq: Once | INTRAMUSCULAR | Status: AC
  Administered 2015-06-24: 5 mg via INTRAMUSCULAR

## 2015-06-24 MED ORDER — ONDANSETRON 4 MG PO TBDP
ORAL_TABLET | ORAL | Status: AC
  Filled 2015-06-24: qty 1

## 2015-06-24 MED ORDER — METOCLOPRAMIDE HCL 5 MG/ML IJ SOLN
INTRAMUSCULAR | Status: AC
  Filled 2015-06-24: qty 2

## 2015-06-24 MED ORDER — KETOROLAC TROMETHAMINE 60 MG/2ML IM SOLN
INTRAMUSCULAR | Status: AC
  Filled 2015-06-24: qty 2

## 2015-06-24 MED ORDER — ONDANSETRON 4 MG PO TBDP
4.0000 mg | ORAL_TABLET | Freq: Once | ORAL | Status: AC
Start: 2015-06-24 — End: 2015-06-24
  Administered 2015-06-24: 4 mg via ORAL

## 2015-06-24 MED ORDER — KETOROLAC TROMETHAMINE 60 MG/2ML IM SOLN
60.0000 mg | Freq: Once | INTRAMUSCULAR | Status: AC
  Administered 2015-06-24: 60 mg via INTRAMUSCULAR

## 2015-06-24 NOTE — ED Provider Notes (Signed)
CSN: [id number]     Arrival date & time 06/24/15  1405 History   First MD Initiated Contact with Patient 06/24/15 1532     Chief Complaint  Patient presents with  . Migraine   (Consider location/radiation/quality/duration/timing/severity/associated sxs/prior Treatment) HPI Comments: 25 year old obese female with a long history of migraine headaches states that she developed her most recent headache this morning. It started out as what she calls cognitive issue problems such as having problems thinking saying the words that she wants to problems reading. She states her eyes hurt particular on the left. The headache is located behind the left eye and in the occiput and then it spreads across her entire head in a helmet light pattern. Is associated with photophobia and nausea without vomiting. She denies problems with vision but perceives that she has slurred speech. Denies problems with hearing or swallowing. She took a Vicodin this afternoon but did not help. She states this headache is similar to her past headaches and there are no new or different symptoms. On previous visits to her neurologist and to the ER she had received injections of Reglan, Benadryl and Toradol which helped with her headache and documented as such. She states that Benadryl does make her hyper and nervous.   Past Medical History  Diagnosis Date  . Hip dysplasia   . Sacroiliac joint dysfunction of both sides   . IBS (irritable bowel syndrome)   . Normal pregnancy 06/15/2012  . Depression   . Anxiety   . Asthma     "very mild"  . Complication of anesthesia     convulsion with epidural redose  . IDA (iron deficiency anemia)   . Cardiac dysrhythmia, unspecified   . Migraine, unspecified, without mention of intractable migraine without mention of status migrainosus   . Kidney stone   . Fibromyalgia January, 2016   Past Surgical History  Procedure Laterality Date  . Laparoscopic tubal ligation  09/16/2012    Procedure:  LAPAROSCOPIC TUBAL LIGATION;  Surgeon: [name], MD;  Location: [facility];  Service: Gynecology;  Laterality: Bilateral;   Family History  Problem Relation Age of Onset  . Diabetes Mother   . Arthritis Mother   . Kidney disease Mother   . Mental illness Mother   . Fibromyalgia Mother   . Heart disease Father   . Diabetes Father   . Hypertension Father   . Hyperlipidemia Father   . Mental illness Father     ptsd and bipolar  . Heart attack Father   . Depression Father   . Heart disease Paternal Uncle   . Diabetes Maternal Grandmother   . Arthritis Maternal Grandmother   . Cancer Paternal Grandmother     colon  . Hyperlipidemia Paternal Grandmother   . Hypertension Paternal Grandmother   . Diabetes Paternal Grandfather   . Hyperlipidemia Paternal Grandfather   . Hypertension Paternal Grandfather   . Mental illness Paternal Grandfather   . Heart disease Paternal Grandfather   . Other Neg Hx   . Migraines Mother   . Osteoporosis Mother    History  Substance Use Topics  . Smoking status: Former Smoker -- 2 years    Types: Cigarettes    Quit date: 06/05/2008  . Smokeless tobacco: Never Used  . Alcohol Use: Yes     Comment: occasional    OB History    Gravida Para Term Preterm AB TAB SAB Ectopic Multiple Living   0 0 0 0 0 0  2     Review of Systems  Constitutional: Positive for activity change and appetite change. Negative for fever and fatigue.  Eyes: Positive for photophobia and pain. Negative for visual disturbance.  Respiratory: Negative.   Cardiovascular: Negative.   Gastrointestinal: Positive for nausea. Negative for vomiting and abdominal pain.  Genitourinary: Negative.   Musculoskeletal: Negative.   Skin: Negative.   Neurological: Positive for speech difficulty and headaches. Negative for tremors, seizures, facial asymmetry, weakness and numbness.    Allergies  Benadryl; Other; and Saphris  Home Medications   Prior to Admission medications    Medication Sig Start Date End Date Taking? Authorizing Provider  albuterol (PROVENTIL HFA;VENTOLIN HFA) 108 (90 BASE) MCG/ACT inhaler Inhale 2 puffs into the lungs every 6 (six) hours as needed for wheezing or shortness of breath.    Historical Provider, MD  benztropine (COGENTIN) 1 MG tablet Take 1-2 mg by mouth 2 (two) times daily. Take 1 mg every morning and 2 mg every evening    Historical Provider, MD  busPIRone (BUSPAR) 10 MG tablet Take 10 mg by mouth 3 (three) times daily.    Historical Provider, MD  desvenlafaxine (PRISTIQ) 50 MG 24 hr tablet Take 100 mg by mouth daily.     Historical Provider, MD  HYDROcodone-acetaminophen (NORCO/VICODIN) 5-325 MG per tablet Take 1-2 tablets by mouth every 6 (six) hours as needed. 04/20/15   Jennifer Piepenbrink, PA-C  lithium 300 MG tablet Take 300 mg by mouth 3 (three) times daily.    Historical Provider, MD  LORazepam (ATIVAN) 1 MG tablet Take 1 mg by mouth daily as needed.  04/12/15   Historical Provider, MD  ondansetron (ZOFRAN ODT) 4 MG disintegrating tablet Take 1 tablet (4 mg total) by mouth every 8 (eight) hours as needed for nausea or vomiting. 04/20/15   Francee Piccolo, PA-C  paliperidone (INVEGA) 3 MG 24 hr tablet Take 3 mg by mouth daily. 03/04/15   Historical Provider, MD  perphenazine (TRILAFON) 2 MG tablet Take 2 mg by mouth 2 (two) times daily. Patient unsure of dosage    Historical Provider, MD  pregabalin (LYRICA) 75 MG capsule Take 1 capsule (75 mg total) by mouth 2 (two) times daily. Patient not taking: Reported on 06/20/2015 04/25/15   Sheliah Hatch, MD  QUEtiapine (SEROQUEL) 400 MG tablet Take 400 mg by mouth 2 (two) times daily.    Historical Provider, MD   BP 127/85 mmHg  Pulse 108  Temp(Src) 98.7 F (37.1 C) (Oral)  Resp 16  SpO2 97% Physical Exam  Constitutional: She is oriented to person, place, and time. She appears well-developed. No distress.  HENT:  Head: Normocephalic and atraumatic.  Mouth/Throat: No  oropharyngeal exudate.  Soft palate rises symmetrically. Uvula and tongue midline. Swallowing reflex normal.  Eyes: Conjunctivae and EOM are normal. Pupils are equal, round, and reactive to light. Right eye exhibits no discharge. Left eye exhibits no discharge.  Neck: Normal range of motion.  Cardiovascular: Normal rate, regular rhythm and normal heart sounds.   Pulmonary/Chest: Effort normal. No respiratory distress. She has no wheezes.  Musculoskeletal: She exhibits no edema or tenderness.  Lymphadenopathy:    She has no cervical adenopathy.  Neurological: She is alert and oriented to person, place, and time. She has normal strength. She displays no tremor. No cranial nerve deficit or sensory deficit. She exhibits normal muscle tone. She displays no seizure activity. Coordination normal. GCS eye subscore is 4. GCS verbal subscore is 5. GCS motor subscore is 6.  Her speech is smooth and goal oriented. I detect no slurring. Motor movements are coordinated. Balance is normal.  Nursing note and vitals reviewed.   ED Course  Procedures (including critical care time) Labs Review Labs Reviewed - No data to display  Imaging Review No results found.   MDM   1. Migraine without status migrainosus, not intractable, unspecified migraine type     Toradol 60 mg IM Zofran 4 mg by mouth Reglan 5 mg IM  Follow-up with your PCP or neurologist early next week. For any worsening new symptoms or problems go to the emergency department.    Hayden Rasmussen, NP 06/24/15 [phone]

## 2015-06-24 NOTE — Telephone Encounter (Signed)
Will you call her and see if she would like to come in and wait to be seen?

## 2015-06-24 NOTE — ED Notes (Signed)
Pt comes in with sudden migraine attack today States she went to Neurologist and instructed to come here for treatment C/o pressure with pain radiating to neck,back

## 2015-06-24 NOTE — Psych (Signed)
[facility] PHP THERAPIST PROGRESS NOTE  Teresa Bartlett [id number]   Date: 06/24/15  Time: 11:00 AM-12:30 PM    Group Topic/Focus:  Self-Esteem Building Strategies  Participation Level:  Active  Participation Quality:  Appropriate and Sharing  Affect:  Appropriate  Cognitive:  Appropriate  Insight: Appropriate  Engagement in Group:  Engaged and Supportive  Modes of Intervention:  Activity, Discussion, Education, Exploration and Skills to [employer]  Additional Comments: Group topic related to self-esteem building strategies. Group started with a check in. Therapist reinforced the group member's insight that they can't find self-worth from other people. Therapist pointed out that finding worth through other is not dependable, risky because it puts your sense of worth in someone else's hands and is not a way to build a lasting sense of worth. The group discussed ways to change perspective to help build self-worth. This included giving yourself a positive self-message even if it is over one tiny thing and this actually helps you to build new passageways in the brain. We discussed self-care as helping patients feel better and exercise as being very helpful in overall sense of well-being and feeling good about oneself. Patient contributed to building insight in discussion by relating self-esteem strategies that have been effective for her and supportive of other group members working on their self-esteem. She checked in that she has been exercising and therapist noted this as a positive step in working on herself in treatment.     Suicidal/Homicidal: no  Plan: 1.Patient utilize her insight and continue with strategies she has been using to build self-esteem. 2.Patient use treatment to learn strategies to manage anxiety and depression.   [facility] PHP THERAPIST GROUP NOTE  Date: 06/24/15  Time: 1:00 PM_2:00 PM   Group Topic/Focus:  Group topic related to watching and discussing the  movie "Inside Out" and helping patients to see the connection between the messages of the movie and treatment goals of the patients.  Participation Level:  Minimal  Participation Quality:  Inattentive  Affect:  Anxious and Appropriate  Cognitive:  Lacking  Insight: Limited  Engagement in Group:  Lacking  Modes of Intervention:  Activity, Discussion, Education, Exploration and Skills for Managing Emotion  Additional Comments: Group topic related to watching and discussing the movie "Inside Out" and helping patients to see the connection between the messages of the movie and treatment goals of the patients. The movie helps patients understand that we can connect to and accept our emotions and memories in ways that we can thrive and do well. All our emotions exist for a purpose. Acknowledging and understanding emotions is much healthier, productive and adaptive than ignoring their importance. The movie helps them see that having a language for their emotions helps them to learn from them and approach them with less judgement. The message was helpful for patient in gaining insight into her emotions, tools for better regulation of emotions and recognition of therapy as a means to helping her to gain better tools to understand and help manage their emotions. Patient was concerned about the movie as she related that it causes flashbacks when she watches movies. She said that she would step out if she was to anxious for a few minutes. She lacked participation, however, due to migraines and asked to leave a little early from [facility] programming early. She planned to go to neurologist.       Suicidal/Homicidal: no  Plan: 1.Patient continue to have better understanding of emotions for healthier management of  emotions.2.Patient continue to take positive steps to build a healthy lifestyle.   Diagnosis: Primary Diagnosis: Bipolar I disorder, most recent episode mixed, severe with psychotic features  [F31.64]    1. Bipolar I disorder, most recent episode mixed, severe with psychotic features   2. Panic disorder with agoraphobia   3. PTSD (post-traumatic stress disorder)       Astin Rape A 06/24/2015

## 2015-06-24 NOTE — Telephone Encounter (Signed)
Pt  States that she has a migraine and would like to have Dr Allena Katz sign off a shot for her please call her at [phone]

## 2015-06-27 ENCOUNTER — Encounter (HOSPITAL_COMMUNITY): Payer: Self-pay

## 2015-06-27 ENCOUNTER — Ambulatory Visit (HOSPITAL_COMMUNITY): Payer: Self-pay | Admitting: Licensed Clinical Social Worker

## 2015-06-27 ENCOUNTER — Other Ambulatory Visit (HOSPITAL_COMMUNITY): Payer: BLUE CROSS/BLUE SHIELD | Admitting: Licensed Clinical Social Worker

## 2015-06-27 ENCOUNTER — Ambulatory Visit (HOSPITAL_COMMUNITY)
Admission: RE | Admit: 2015-06-27 | Discharge: 2015-06-27 | Disposition: A | Discharge status: Home / Self Care | Payer: BLUE CROSS/BLUE SHIELD | Attending: Psychiatry | Admitting: Psychiatry

## 2015-06-27 ENCOUNTER — Emergency Department (HOSPITAL_COMMUNITY)
Admission: EM | Admit: 2015-06-27 | Discharge: 2015-06-28 | Disposition: A | Discharge status: Home / Self Care | Payer: BLUE CROSS/BLUE SHIELD | Attending: Emergency Medicine | Admitting: Emergency Medicine

## 2015-06-27 DIAGNOSIS — Z862 Personal history of diseases of the blood and blood-forming organs and certain disorders involving the immune mechanism: Secondary | ICD-10-CM | POA: Insufficient documentation

## 2015-06-27 DIAGNOSIS — Z3202 Encounter for pregnancy test, result negative: Secondary | ICD-10-CM | POA: Insufficient documentation

## 2015-06-27 DIAGNOSIS — F259 Schizoaffective disorder, unspecified: Secondary | ICD-10-CM | POA: Diagnosis present

## 2015-06-27 DIAGNOSIS — F3164 Bipolar disorder, current episode mixed, severe, with psychotic features: Secondary | ICD-10-CM

## 2015-06-27 DIAGNOSIS — F4310 Post-traumatic stress disorder, unspecified: Secondary | ICD-10-CM | POA: Diagnosis not present

## 2015-06-27 DIAGNOSIS — F329 Major depressive disorder, single episode, unspecified: Secondary | ICD-10-CM | POA: Insufficient documentation

## 2015-06-27 DIAGNOSIS — Z87891 Personal history of nicotine dependence: Secondary | ICD-10-CM | POA: Diagnosis not present

## 2015-06-27 DIAGNOSIS — F419 Anxiety disorder, unspecified: Secondary | ICD-10-CM | POA: Diagnosis not present

## 2015-06-27 DIAGNOSIS — F4001 Agoraphobia with panic disorder: Secondary | ICD-10-CM

## 2015-06-27 DIAGNOSIS — Z87442 Personal history of urinary calculi: Secondary | ICD-10-CM | POA: Insufficient documentation

## 2015-06-27 DIAGNOSIS — Z8739 Personal history of other diseases of the musculoskeletal system and connective tissue: Secondary | ICD-10-CM | POA: Diagnosis not present

## 2015-06-27 DIAGNOSIS — Z8679 Personal history of other diseases of the circulatory system: Secondary | ICD-10-CM | POA: Diagnosis not present

## 2015-06-27 DIAGNOSIS — R45851 Suicidal ideations: Secondary | ICD-10-CM | POA: Diagnosis present

## 2015-06-27 LAB — RAPID URINE DRUG SCREEN, HOSP PERFORMED
AMPHETAMINES: NOT DETECTED
BARBITURATES: NOT DETECTED
BENZODIAZEPINES: NOT DETECTED
COCAINE: NOT DETECTED
Opiates: NOT DETECTED
Tetrahydrocannabinol: NOT DETECTED

## 2015-06-27 LAB — CBC WITH DIFFERENTIAL/PLATELET
BASOS PCT: 0 % (ref 0–1)
Basophils Absolute: 0 10*3/uL (ref 0.0–0.1)
Eosinophils Absolute: 0.4 10*3/uL (ref 0.0–0.7)
Eosinophils Relative: 5 % (ref 0–5)
HCT: 36.4 % (ref 36.0–46.0)
Hemoglobin: 12.2 g/dL (ref 12.0–15.0)
Lymphocytes Relative: 28 % (ref 12–46)
Lymphs Abs: 2 10*3/uL (ref 0.7–4.0)
MCH: 29.8 pg (ref 26.0–34.0)
MCHC: 33.5 g/dL (ref 30.0–36.0)
MCV: 88.8 fL (ref 78.0–100.0)
MONO ABS: 0.5 10*3/uL (ref 0.1–1.0)
Monocytes Relative: 7 % (ref 3–12)
NEUTROS PCT: 60 % (ref 43–77)
Neutro Abs: 4.1 10*3/uL (ref 1.7–7.7)
PLATELETS: 322 10*3/uL (ref 150–400)
RBC: 4.1 MIL/uL (ref 3.87–5.11)
RDW: 13.6 % (ref 11.5–15.5)
WBC: 7 10*3/uL (ref 4.0–10.5)

## 2015-06-27 LAB — COMPREHENSIVE METABOLIC PANEL
ALBUMIN: 4.4 g/dL (ref 3.5–5.0)
ALK PHOS: 63 U/L (ref 38–126)
ALT: 52 U/L (ref 14–54)
ANION GAP: 7 (ref 5–15)
AST: 41 U/L (ref 15–41)
BUN: 14 mg/dL (ref 6–20)
CALCIUM: 9.4 mg/dL (ref 8.9–10.3)
CHLORIDE: 104 mmol/L (ref 101–111)
CO2: 27 mmol/L (ref 22–32)
Creatinine, Ser: 0.81 mg/dL (ref 0.44–1.00)
GFR calc non Af Amer: >60 mL/min (ref >60)
GLUCOSE: 96 mg/dL (ref 65–99)
POTASSIUM: 3.8 mmol/L (ref 3.5–5.1)
SODIUM: 138 mmol/L (ref 135–145)
TOTAL PROTEIN: 7.8 g/dL (ref 6.5–8.1)
Total Bilirubin: 0.5 mg/dL (ref 0.3–1.2)

## 2015-06-27 LAB — SALICYLATE LEVEL: Salicylate Lvl: <4 mg/dL (ref 2.8–30.0)

## 2015-06-27 LAB — POC URINE PREG, ED: Preg Test, Ur: NEGATIVE

## 2015-06-27 LAB — ACETAMINOPHEN LEVEL

## 2015-06-27 LAB — ETHANOL: Alcohol, Ethyl (B): <5 mg/dL (ref <5)

## 2015-06-27 MED ORDER — ZOLPIDEM TARTRATE 5 MG PO TABS: 5.0000 mg | ORAL_TABLET | Freq: Every evening | ORAL | Status: DC | PRN

## 2015-06-27 MED ORDER — LITHIUM CARBONATE 300 MG PO TABS: 300.0000 mg | ORAL_TABLET | Freq: Two times a day (BID) | ORAL | Status: DC

## 2015-06-27 MED ORDER — BUSPIRONE HCL 10 MG PO TABS
10.0000 mg | ORAL_TABLET | Freq: Three times a day (TID) | ORAL | Status: DC
  Administered 2015-06-27 – 2015-06-28 (×3): 10 mg via ORAL
  Filled 2015-06-27 (×3): qty 1

## 2015-06-27 MED ORDER — NICOTINE 21 MG/24HR TD PT24: 21.0000 mg | MEDICATED_PATCH | Freq: Every day | TRANSDERMAL | Status: DC

## 2015-06-27 MED ORDER — DESVENLAFAXINE SUCCINATE ER 100 MG PO TB24
100.0000 mg | ORAL_TABLET | Freq: Every day | ORAL | Status: DC
  Administered 2015-06-28: 100 mg via ORAL
  Filled 2015-06-27: qty 1

## 2015-06-27 MED ORDER — IBUPROFEN 200 MG PO TABS
600.0000 mg | ORAL_TABLET | Freq: Three times a day (TID) | ORAL | Status: DC | PRN
  Administered 2015-06-28: 600 mg via ORAL
  Filled 2015-06-27: qty 3

## 2015-06-27 MED ORDER — LITHIUM CARBONATE ER 300 MG PO TBCR
600.0000 mg | EXTENDED_RELEASE_TABLET | Freq: Every day | ORAL | Status: DC
  Administered 2015-06-27: 600 mg via ORAL
  Filled 2015-06-27 (×2): qty 2

## 2015-06-27 MED ORDER — ONDANSETRON HCL 4 MG PO TABS: 4.0000 mg | ORAL_TABLET | Freq: Three times a day (TID) | ORAL | Status: DC | PRN

## 2015-06-27 MED ORDER — LITHIUM CARBONATE ER 300 MG PO TBCR
300.0000 mg | EXTENDED_RELEASE_TABLET | Freq: Every day | ORAL | Status: DC
  Administered 2015-06-28: 300 mg via ORAL
  Filled 2015-06-27: qty 1

## 2015-06-27 MED ORDER — QUETIAPINE FUMARATE 300 MG PO TABS: 800.0000 mg | ORAL_TABLET | Freq: Every day | ORAL | Status: DC

## 2015-06-27 MED ORDER — LORAZEPAM 1 MG PO TABS
1.0000 mg | ORAL_TABLET | Freq: Three times a day (TID) | ORAL | Status: DC | PRN
  Administered 2015-06-28: 1 mg via ORAL
  Filled 2015-06-27: qty 1

## 2015-06-27 MED ORDER — ALUM & MAG HYDROXIDE-SIMETH 200-200-20 MG/5ML PO SUSP: 30.0000 mL | ORAL | Status: DC | PRN

## 2015-06-27 MED ORDER — QUETIAPINE FUMARATE 300 MG PO TABS
800.0000 mg | ORAL_TABLET | Freq: Every day | ORAL | Status: DC
  Administered 2015-06-27: 800 mg via ORAL
  Filled 2015-06-27 (×4): qty 2

## 2015-06-27 MED ORDER — BENZTROPINE MESYLATE 1 MG PO TABS
1.0000 mg | ORAL_TABLET | Freq: Three times a day (TID) | ORAL | Status: DC
  Administered 2015-06-27 – 2015-06-28 (×3): 1 mg via ORAL
  Filled 2015-06-27 (×3): qty 1

## 2015-06-27 MED ORDER — ACETAMINOPHEN 325 MG PO TABS: 650.0000 mg | ORAL_TABLET | ORAL | Status: DC | PRN

## 2015-06-27 MED ORDER — ONDANSETRON HCL 4 MG PO TABS
4.0000 mg | ORAL_TABLET | Freq: Once | ORAL | Status: AC
  Administered 2015-06-27: 4 mg via ORAL
  Filled 2015-06-27: qty 1

## 2015-06-27 MED ORDER — PREGABALIN 50 MG PO CAPS: 75.0000 mg | ORAL_CAPSULE | Freq: Two times a day (BID) | ORAL | Status: DC

## 2015-06-27 MED ORDER — ALBUTEROL SULFATE HFA 108 (90 BASE) MCG/ACT IN AERS: 2.0000 | INHALATION_SPRAY | Freq: Four times a day (QID) | RESPIRATORY_TRACT | Status: DC | PRN

## 2015-06-27 NOTE — Progress Notes (Signed)
Adult Psychoeducational Group Note  Date:  06/27/2015 Time:  4:42 PM   Affect:  Anxious and Not Congruent  Cognitive:  Delusional and Hallucinating  Insight: Lacking  Additional Comments:  Teresa Bartlett stated she was feeling suicidal and reported to the therapist "I don't want to die, I just feel suicidal and this feels horrible. I don't want to feel like this."  She requested to be taken to Inpatient for an assessment. Therapist escorted her to TTS for an assessment and informed Dr.Lugo (attending psychiatrist) of her request to be admitted on today. Dr. Dub MikesLugo consulted briefly with the therapist and agreed to admittance. Teresa Bartlett showed the therapist several cuts and scratches on her left arm. She reported " I did some of this while I was still in the hospital. I don't think my medication is working." She reports that she had gotten "off track" over the weekend and had not taken any of her medications. She says she feels they have not stabilized her anyway and that she is going in and out of the hospital because no one "knows what to do for me." She states that she feels much of her present mood changes were caused by several arguments in succession with her husband over the weekend. She reports PHP was a great help to her and would like to return when she is discharged from inpatient.  She stated she felt she'd needed more help to stabilize both her mood and her medications before being able to adequately participate in a group setting.    Teresa Bartlett 06/27/2015, 4:42 PM

## 2015-06-27 NOTE — BH Assessment (Signed)
Inpt placement being sought as there [facility]. [facility], [facility], [facility], [facility]    Teresa Bartlett, [place] Triage Specialist 06/27/2015 10:30 PM

## 2015-06-27 NOTE — ED Notes (Signed)
Patient pleasant, cooperative. Denies SI, HI. Rates feelings of anxiety and of depression 1/10 at present. Reports AVH at present as "small", level improved. Reports fall approximately 2 weeks ago related to numbness she experiences intermittently d/t fibromyalgia.   Encouragement offered.   Q 15 safety checks continue.

## 2015-06-27 NOTE — BH Assessment (Signed)
Tele Assessment Note   Teresa Bartlett is an 25 y.o. female. Pt reports SI with "many plans." Pt denies previous SI attempts. Pt denies HI. Pt reports visual hallucinations. Pt states that she is seeing flying dogs, cars, and she does not recognize her face. Pt reports 2 previous hospitalizations at [facility] this month. Pt reports current outpatient treatment at [facility] partial hospitalization. According to the Pt, she began partial hospitalization last Tuesday 06/21/15. When asked about medication Pt reports too many to report. Pt reports the following diagnoses: Schizoaffective, MDD, PTSD, Dissociative Disorder, and panic attacks. Pt states that she began having severe mental health symptoms 4 years ago. Pt denies SA. Pt denies abuse. Pt reports decreased sleep and appetite. Pt reports self-harming. According to the Pt, she scratches her arms until they bleed. The Pt states she continues to scratch old wounds.   Writer consulted with Renata Caprice, DNP. Per Renata Caprice Pt meets inpatient criteria. No [facility] beds. TTS to seek placement.  Axis I: Major Depression, Recurrent severe, Post Traumatic Stress Disorder and Schizoaffective Disorder Axis II: Deferred Axis III:  Past Medical History  Diagnosis Date  . Hip dysplasia   . Sacroiliac joint dysfunction of both sides   . IBS (irritable bowel syndrome)   . Normal pregnancy 06/15/2012  . Depression   . Anxiety   . Asthma     "very mild"  . Complication of anesthesia     convulsion with epidural redose  . IDA (iron deficiency anemia)   . Cardiac dysrhythmia, unspecified   . Migraine, unspecified, without mention of intractable migraine without mention of status migrainosus   . Kidney stone   . Fibromyalgia January, 2016   Axis IV: other psychosocial or environmental problems, problems related to social environment and problems with primary support group Axis V: 31-40 impairment in reality testing  Past Medical History:  Past Medical History   Diagnosis Date  . Hip dysplasia   . Sacroiliac joint dysfunction of both sides   . IBS (irritable bowel syndrome)   . Normal pregnancy 06/15/2012  . Depression   . Anxiety   . Asthma     "very mild"  . Complication of anesthesia     convulsion with epidural redose  . IDA (iron deficiency anemia)   . Cardiac dysrhythmia, unspecified   . Migraine, unspecified, without mention of intractable migraine without mention of status migrainosus   . Kidney stone   . Fibromyalgia January, 2016    Past Surgical History  Procedure Laterality Date  . Laparoscopic tubal ligation  09/16/2012    Procedure: LAPAROSCOPIC TUBAL LIGATION;  Surgeon: [name], MD;  Location: [facility];  Service: Gynecology;  Laterality: Bilateral;    Family History:  Family History  Problem Relation Age of Onset  . Diabetes Mother   . Arthritis Mother   . Kidney disease Mother   . Mental illness Mother   . Fibromyalgia Mother   . Heart disease Father   . Diabetes Father   . Hypertension Father   . Hyperlipidemia Father   . Mental illness Father     ptsd and bipolar  . Heart attack Father   . Depression Father   . Heart disease Paternal Uncle   . Diabetes Maternal Grandmother   . Arthritis Maternal Grandmother   . Cancer Paternal Grandmother     colon  . Hyperlipidemia Paternal Grandmother   . Hypertension Paternal Grandmother   . Diabetes Paternal Grandfather   . Hyperlipidemia Paternal Grandfather   .  Hypertension Paternal Grandfather   . Mental illness Paternal Grandfather   . Heart disease Paternal Grandfather   . Other Neg Hx   . Migraines Mother   . Osteoporosis Mother     Social History:  reports that she quit smoking about 7 years ago. Her smoking use included Cigarettes. She quit after 2 years of use. She has never used smokeless tobacco. She reports that she drinks alcohol. She reports that she does not use illicit drugs.  Additional Social History:  Alcohol / Drug Use Pain  Medications: Pt denies  Prescriptions: Pt states " too many review my chart." Over the Counter: Pt denies History of alcohol / drug use?: No history of alcohol / drug abuse Longest period of sobriety (when/how long): NA  CIWA:   COWS:    PATIENT STRENGTHS: (choose at least two) Capable of independent living Communication skills  Allergies:  Allergies  Allergen Reactions  . Benadryl [Diphenhydramine]   . Other Hives    Glue used for EKG leads, other adhesives  . Saphris [Asenapine]     Home Medications:  (Not in a hospital admission)  OB/GYN Status:  No LMP recorded.  General Assessment Data Location of Assessment: [facility] Assessment Services TTS Assessment: In system Is this a Tele or Face-to-Face Assessment?: Face-to-Face Is this an Initial Assessment or a Re-assessment for this encounter?: Initial Assessment Marital status: Married [place] name: NA Is patient pregnant?: No Pregnancy Status: No Living Arrangements: Spouse/significant other, Children Can pt return to current living arrangement?: Yes Admission Status: Voluntary Is patient capable of signing voluntary admission?: No Referral Source: Self/Family/Friend Insurance type: BCBS     Crisis Care Plan Living Arrangements: Spouse/significant other, Children Name of Psychiatrist: Partial Hospitalization Name of Therapist: Partial Hospitalization  Education Status Is patient currently in school?: Yes Current Grade: College Highest grade of school patient has completed: 12 Name of school: NA Contact person: NA  Risk to self with the past 6 months Suicidal Ideation: Yes-Currently Present Has patient been a risk to self within the past 6 months prior to admission? : Yes Suicidal Intent: Yes-Currently Present Has patient had any suicidal intent within the past 6 months prior to admission? : Yes Is patient at risk for suicide?: Yes Suicidal Plan?: Yes-Currently Present Has patient had any suicidal plan within  the past 6 months prior to admission? : Yes Specify Current Suicidal Plan: NA Access to Means: No What has been your use of drugs/alcohol within the last 12 months?: NA Previous Attempts/Gestures: No How many times?: 0 Other Self Harm Risks: NA Triggers for Past Attempts: None known Intentional Self Injurious Behavior: None Family Suicide History: No Recent stressful life event(s): Other (Comment) (NA) Persecutory voices/beliefs?: No Depression: Yes Depression Symptoms: Tearfulness, Isolating, Loss of interest in usual pleasures, Feeling worthless/self pity, Feeling angry/irritable Substance abuse history and/or treatment for substance abuse?: No Suicide prevention information given to non-admitted patients: Not applicable  Risk to Others within the past 6 months Homicidal Ideation: No Does patient have any lifetime risk of violence toward others beyond the six months prior to admission? : No Thoughts of Harm to Others: No Current Homicidal Intent: No Current Homicidal Plan: No Access to Homicidal Means: No Identified Victim: NA History of harm to others?: No Assessment of Violence: None Noted Violent Behavior Description: NA Does patient have access to weapons?: No Criminal Charges Pending?: No Does patient have a court date: No Is patient on probation?: No  Psychosis Hallucinations: None noted Delusions: None noted  Mental  Status Report Appearance/Hygiene: Unremarkable Eye Contact: Good Motor Activity: Freedom of movement Speech: Logical/coherent Level of Consciousness: Alert Mood: Pleasant Affect: Appropriate to circumstance Anxiety Level: Minimal Thought Processes: Coherent, Relevant Judgement: Unimpaired Orientation: Person, Place, Time, Situation, Appropriate for developmental age Obsessive Compulsive Thoughts/Behaviors: None  Cognitive Functioning Concentration: Normal Memory: Recent Intact, Remote Intact IQ: Average Insight: Fair Impulse Control:  Fair Appetite: Poor Weight Loss: 0 Weight Gain: 0 Sleep: Decreased Total Hours of Sleep: 4 Vegetative Symptoms: None  ADLScreening [facility] Assessment Services) Patient's cognitive ability adequate to safely complete daily activities?: Yes Patient able to express need for assistance with ADLs?: Yes Independently performs ADLs?: Yes (appropriate for developmental age)  Prior Inpatient Therapy Prior Inpatient Therapy: Yes Prior Therapy Dates: 2016 Prior Therapy Facilty/Provider(s): [facility] Reason for Treatment: SI  Prior Outpatient Therapy Prior Outpatient Therapy: Yes Prior Therapy Dates: 2016 Prior Therapy Facilty/Provider(s): [facility] Reason for Treatment: SI, schizo-affective Does patient have an ACCT team?: No Does patient have Intensive In-House Services?  : No Does patient have [facility] services? : No Does patient have P4CC services?: No  ADL Screening (condition at time of admission) Patient's cognitive ability adequate to safely complete daily activities?: Yes Is the patient deaf or have difficulty hearing?: No Does the patient have difficulty seeing, even when wearing glasses/contacts?: No Does the patient have difficulty concentrating, remembering, or making decisions?: No Patient able to express need for assistance with ADLs?: Yes Does the patient have difficulty dressing or bathing?: No Independently performs ADLs?: Yes (appropriate for developmental age) Weakness of Legs: None Weakness of Arms/Hands: None       Abuse/Neglect Assessment (Assessment to be complete while patient is alone) Physical Abuse: Denies Verbal Abuse: Denies Sexual Abuse: Denies Exploitation of patient/patient's resources: Denies Self-Neglect: Denies     Merchant navy officer (For Healthcare) Does patient have an advance directive?: No Would patient like information on creating an advanced directive?: No - patient declined information    Additional Information 1:1 In Past 12 Months?:  No CIRT Risk: No Elopement Risk: No Does patient have medical clearance?: No     Disposition:  Disposition Initial Assessment Completed for this Encounter: Yes Disposition of Patient: Inpatient treatment program Type of inpatient treatment program: Adult  Shatonia Hoots D 06/27/2015 3:02 PM

## 2015-06-27 NOTE — ED Provider Notes (Signed)
CSN: [id number]     Arrival date & time 06/27/15  1434 History  This chart was scribed for non-physician practitioner Fayrene Helper, PA-C working with Mancel Bale, MD by Murriel Hopper, ED Scribe. This patient was seen in room WTR4/WLPT4 and the patient's care was started at 3:12 PM.    No chief complaint on file.    The history is provided by the patient. No language interpreter was used.     HPI Comments: Teresa Bartlett is a 25 y.o. female with a Hx of schizophrenia, bipolar disorder, PTSD, hallucinations who presents to the Emergency Department complaining of constant psychiatric problems that have been present for several months. Pt states that she is currently enrolled in a partial hospitalization program at [facility] in which she goes in from 11 AM- 4 PM every day. Pt states she was in inpatient [facility] for 16 days this month and states the medications she is taking are not helping with her symptoms. She states her symptoms include hallucinations, suicidal thoughts, self-harm, and anxiety. Pt notes that she cannot see her primary psych doctor until the end of August and notes that she has not been getting any better recently with all of her treatment strategies. Pt reports thinking of suicide by taking painkillers or cutting herself and reports self-harm in the past two weeks by scratching at her wrists. Pt denies homicidal ideations.     Past Medical History  Diagnosis Date  . Hip dysplasia   . Sacroiliac joint dysfunction of both sides   . IBS (irritable bowel syndrome)   . Normal pregnancy 06/15/2012  . Depression   . Anxiety   . Asthma     "very mild"  . Complication of anesthesia     convulsion with epidural redose  . IDA (iron deficiency anemia)   . Cardiac dysrhythmia, unspecified   . Migraine, unspecified, without mention of intractable migraine without mention of status migrainosus   . Kidney stone   . Fibromyalgia January, 2016   Past  Surgical History  Procedure Laterality Date  . Laparoscopic tubal ligation  09/16/2012    Procedure: LAPAROSCOPIC TUBAL LIGATION;  Surgeon: [name], MD;  Location: [facility];  Service: Gynecology;  Laterality: Bilateral;   Family History  Problem Relation Age of Onset  . Diabetes Mother   . Arthritis Mother   . Kidney disease Mother   . Mental illness Mother   . Fibromyalgia Mother   . Heart disease Father   . Diabetes Father   . Hypertension Father   . Hyperlipidemia Father   . Mental illness Father     ptsd and bipolar  . Heart attack Father   . Depression Father   . Heart disease Paternal Uncle   . Diabetes Maternal Grandmother   . Arthritis Maternal Grandmother   . Cancer Paternal Grandmother     colon  . Hyperlipidemia Paternal Grandmother   . Hypertension Paternal Grandmother   . Diabetes Paternal Grandfather   . Hyperlipidemia Paternal Grandfather   . Hypertension Paternal Grandfather   . Mental illness Paternal Grandfather   . Heart disease Paternal Grandfather   . Other Neg Hx   . Migraines Mother   . Osteoporosis Mother    History  Substance Use Topics  . Smoking status: Former Smoker -- 2 years    Types: Cigarettes    Quit date: 06/05/2008  . Smokeless tobacco: Never Used  . Alcohol Use: Yes     Comment: occasional  OB History    Gravida Para Term Preterm AB TAB SAB Ectopic Multiple Living   2 2 2  0 0 0 0 0 0 2     Review of Systems  Psychiatric/Behavioral: Positive for suicidal ideas, hallucinations and self-injury. The patient is nervous/anxious.       Allergies  Benadryl; Other; and Saphris  Home Medications   Prior to Admission medications   Medication Sig Start Date End Date Taking? Authorizing Provider  albuterol (PROVENTIL HFA;VENTOLIN HFA) 108 (90 BASE) MCG/ACT inhaler Inhale 2 puffs into the lungs every 6 (six) hours as needed for wheezing or shortness of breath.    Historical Provider, MD  benztropine (COGENTIN) 1 MG tablet  Take 1-2 mg by mouth 2 (two) times daily. Take 1 mg every morning and 2 mg every evening    Historical Provider, MD  busPIRone (BUSPAR) 10 MG tablet Take 10 mg by mouth 3 (three) times daily.    Historical Provider, MD  desvenlafaxine (PRISTIQ) 50 MG 24 hr tablet Take 100 mg by mouth daily.     Historical Provider, MD  HYDROcodone-acetaminophen (NORCO/VICODIN) 5-325 MG per tablet Take 1-2 tablets by mouth every 6 (six) hours as needed. 04/20/15   Jennifer Piepenbrink, PA-C  lithium 300 MG tablet Take 300 mg by mouth 3 (three) times daily.    Historical Provider, MD  LORazepam (ATIVAN) 1 MG tablet Take 1 mg by mouth daily as needed.  04/12/15   Historical Provider, MD  ondansetron (ZOFRAN ODT) 4 MG disintegrating tablet Take 1 tablet (4 mg total) by mouth every 8 (eight) hours as needed for nausea or vomiting. 04/20/15   Francee PiccoloJennifer Piepenbrink, PA-C  paliperidone (INVEGA) 3 MG 24 hr tablet Take 3 mg by mouth daily. 03/04/15   Historical Provider, MD  perphenazine (TRILAFON) 2 MG tablet Take 2 mg by mouth 2 (two) times daily. Patient unsure of dosage    Historical Provider, MD  pregabalin (LYRICA) 75 MG capsule Take 1 capsule (75 mg total) by mouth 2 (two) times daily. Patient not taking: Reported on 06/20/2015 04/25/15   Sheliah HatchKatherine E Tabori, MD  QUEtiapine (SEROQUEL) 400 MG tablet Take 400 mg by mouth 2 (two) times daily.    Historical Provider, MD   BP 115/74 mmHg  Pulse 107  Temp(Src) 98.4 F (36.9 C) (Oral)  Resp 18  SpO2 98% Physical Exam  Constitutional: She is oriented to person, place, and time. She appears well-developed and well-nourished.  HENT:  Head: Normocephalic and atraumatic.  Cardiovascular: Normal rate and regular rhythm.   Pulmonary/Chest: Effort normal.  Abdominal: Soft. She exhibits no distension. There is no tenderness.  Neurological: She is alert and oriented to person, place, and time.  Skin: Skin is warm and dry.  Multiple scratch marks noted on left wrist and left forearm    Psychiatric: She has a normal mood and affect.  Nursing note and vitals reviewed.   ED Course  Procedures (including critical care time)  DIAGNOSTIC STUDIES: Oxygen Saturation is 98% on room air, normal by my interpretation.    COORDINATION OF CARE: 3:21 PM Discussed treatment plan with pt at bedside and pt agreed to plan. Pt told a medical screening would be done to evaluate her.   Patient expressed suicidal ideation with plan and requesting for help. At this time she is currently voluntary. She has been medically cleared. TTS to evaluate further.   Labs Review Labs Reviewed  CBC WITH DIFFERENTIAL/PLATELET  COMPREHENSIVE METABOLIC PANEL  URINE RAPID DRUG SCREEN, HOSP PERFORMED  ETHANOL  ACETAMINOPHEN LEVEL  SALICYLATE LEVEL  POC URINE PREG, ED    Imaging Review No results found.   EKG Interpretation None      MDM   Final diagnoses:  Suicidal ideation   BP 118/73 mmHg  Pulse 99  Temp(Src) 98.1 F (36.7 C) (Oral)  Resp 19  SpO2 99%  LMP 06/14/2015  I personally performed the services described in this documentation, which was scribed in my presence. The recorded information has been reviewed and is accurate.     Fayrene Helper, PA-C 06/27/15 1625  Mancel Bale, MD 06/27/15 [phone]

## 2015-06-27 NOTE — ED Notes (Signed)
[facility]. C/o of SI with plan. Denies HI. Multiple abrasions to left forearm "cuts". No s/s of infection.

## 2015-06-27 NOTE — ED Notes (Signed)
Patient transferred to [facility] from the triage area.  She is pleasant and cooperative.  She provides a detailed medication history and history of her psychiatric illness.  States she was recently at [facility] and all the medications she was given never stopped or slowed down her hallucinations.  Patient has auditory and visual hallucinations.  She states at times her mind races so fast she can't think or process and other times she feels her thoughts are so slow that she can't even formulate a thought.  She was started on her medications as ordered with the quetiapine being changed to bedtime per her request.  She is currently visiting with her husband who is very supportive.

## 2015-06-27 NOTE — Psych (Signed)
   [facility]  Teresa Holtslexandria Rochford [id number]    Date: 06/27/15   Time: 11:00 AM-12:30 PM   Group Topic/Focus:  Self Esteem Action Plan:   The focus of this group was to help patients identify self-esteem as one of the causes that led to escalation of mental health symptoms and to help patients create Bartlett plan to build self-esteem.  Participation Level:  Active  Participation Quality:  Attentive  Affect:  Appropriate  Cognitive:  Appropriate  Insight: Lacking  Engagement in Group:  Engaged  Modes of Intervention:  Discussion, Education, Exploration, Support and Skills to [employer] Self-Esteem  Additional Comments: Group Focus was on self-esteem issues for group members. Group started with Bartlett check in. Group members recognize that they have self-esteem issues that need to be worked on. Therapist pointed out that this was one of the sources of problems that led to worsening mental health issues so it was an important focus for treatment. The group discussed ways they can start to value themselves and they agreed that it would involve taking steps to get better. One strategy suggested was to recondition their thinking. They could start with identifying one positive thing about themselves every day. Therapist pointed out another way they are working on positive self-esteem is making good choices and taking care of self. One way they had done this was by being in treatment.  Teresa Bartlett reported Bartlett difficult weekend and worsening symptoms. She said she feels actively suicidal, hallucinating, disorganized about taking her meds and that fighting with her husband is leading to worsening symptoms. She feels that she needs to be inpatient right now. Appropriate level of care was discussed with the treatment team and it was agreed to refer her inpatient.   Suicidal-yes/Homicidal-no  Plan: 1.Refer patient to inpatient to help her stabilize her symptoms.2Patient work on Pharmacologistcoping skills to  manage her symptoms.   Diagnosis: Primary Diagnosis: Bipolar I disorder, most recent episode mixed, severe with psychotic features [F31.64]    1. Bipolar I disorder, most recent episode mixed, severe with psychotic features   2. Panic disorder with agoraphobia   3. PTSD (post-traumatic stress disorder)       Teresa Bartlett,Teresa Bartlett 06/27/2015
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
## 2015-06-28 ENCOUNTER — Other Ambulatory Visit (HOSPITAL_COMMUNITY): Payer: BLUE CROSS/BLUE SHIELD | Admitting: Psychiatry

## 2015-06-28 ENCOUNTER — Ambulatory Visit (HOSPITAL_COMMUNITY): Payer: Self-pay | Admitting: Licensed Clinical Social Worker

## 2015-06-28 ENCOUNTER — Encounter (HOSPITAL_COMMUNITY): Payer: Self-pay | Admitting: Licensed Clinical Social Worker

## 2015-06-28 DIAGNOSIS — F3164 Bipolar disorder, current episode mixed, severe, with psychotic features: Secondary | ICD-10-CM

## 2015-06-28 DIAGNOSIS — F4001 Agoraphobia with panic disorder: Secondary | ICD-10-CM

## 2015-06-28 DIAGNOSIS — F4310 Post-traumatic stress disorder, unspecified: Secondary | ICD-10-CM

## 2015-06-28 DIAGNOSIS — F333 Major depressive disorder, recurrent, severe with psychotic symptoms: Secondary | ICD-10-CM | POA: Diagnosis not present

## 2015-06-28 DIAGNOSIS — F259 Schizoaffective disorder, unspecified: Secondary | ICD-10-CM | POA: Diagnosis not present

## 2015-06-28 DIAGNOSIS — R45851 Suicidal ideations: Secondary | ICD-10-CM | POA: Insufficient documentation

## 2015-06-28 MED ORDER — LITHIUM CARBONATE ER 300 MG PO TBCR: 300.0000 mg | EXTENDED_RELEASE_TABLET | Freq: Every day | ORAL | Status: DC

## 2015-06-28 MED ORDER — LITHIUM CARBONATE ER 300 MG PO TBCR: 600.0000 mg | EXTENDED_RELEASE_TABLET | Freq: Every day | ORAL | Status: DC

## 2015-06-28 NOTE — ED Notes (Signed)
Patient calm and cooperative.  She requests discharge so she can continue with Partial Hospitalization Program.  She states she does not feel SI.  Her auditory hallucinations have decreased.  She states that since she stopped the trilifon, her symptoms have decreased.  Patient is to be discharged home.

## 2015-06-28 NOTE — BH Assessment (Signed)
Under review at [facility] per [place].   Clista BernhardtNancy Lorie Melichar, [facility] Triage Specialist 06/28/2015 5:55 AM

## 2015-06-28 NOTE — Telephone Encounter (Signed)
Pt came by our office, this has been completed / Sherri S.

## 2015-06-28 NOTE — Psych (Signed)
   [facility]  Teresa Bartlett [id number]   Date: 06/28/15   Time: 12:30 PM-1:30 PM   Group Topic/Focus:  Daily Rituals to Help Increase Mindfulness and Connect to Spirituality  Participation Level:  Active  Participation Quality:  Attentive and Sharing  Affect:  Appropriate  Cognitive:  Appropriate  Insight: Lacking  Engagement in Group:  Engaged  Modes of Intervention:  Activity, Discussion, Education, Exploration and Strategies for Building Mindfulness and Spirituality  Additional Comments: Therapist showed a video "One Thing To Do Every Morning" from [employer]. Patient's learned how a daily ritual helps to create mindfulness, connects a person to their spirituality and says to yourself that you are worthy and taking care of self. Rituals could be as simple as making oneself a cup of tea or going for a walk with dogs. Patients also learned a good practice ways having a daily review to help you be present with your day and connect you to spirituality. Another practice was to set an intention for the next day and this helps to set up how the next day will unfold for you.  Patient presented to group today after being referred inpatient yesterday. She related that she no longer suicidal thoughts, urges to hurt herself, and that she thinks that discontinuing the Trilafon play a big part in clearing her symptoms. She felt that suggestions from this group activity were useful ones for her and just had to figure out ways to fit with her schedule. She does feel that the morning activity of aerobics has made her feel better.   Suicidal/Homicidal: no  Plan: 1.Review with treatment team any treatment updates related [facility]. 2.Patient continue to learn and apply healthy coping strategies.    Diagnosis: Primary Diagnosis: Bipolar I disorder, most recent episode mixed, severe with psychotic features [F31.64]    1. Bipolar I disorder,  most recent episode mixed, severe with psychotic features   2. Panic disorder with agoraphobia   3. PTSD (post-traumatic stress disorder)       Teresa Bartlett A 06/28/2015

## 2015-06-28 NOTE — Discharge Instructions (Signed)
For your ongoing behavioral health needs, you are advised to continue the Partial Hospitalization Program at [facility]:       [address] [facility]      [address]      [place], [place] [address]      [phone]

## 2015-06-28 NOTE — BH Assessment (Signed)
[facility] Assessment Progress Note  Per Thedore MinsMojeed Akintayo, MD, this pt does not require psychiatric hospitalization at this time.  She is currently a patient in the [facility]  [facility] and is advised to continue with the program.  Pt has been instructed to do so both verbally and in her discharge instructions, and she agrees with this plan.  Pt's nurse, Rayfield CitizenCaroline, has been notified.  Doylene Canninghomas Patrisia Faeth, MA Triage Specialist [phone]

## 2015-06-28 NOTE — BH Assessment (Signed)
[facility]  Per Kateri PlummerKristin Cheshire, [place], Jill AlexandersJustin at [facility] called to report that pt has been accepted to their facility by Dr. Betti Cruzeddy.  Please call report to [phone].  Psychiatry will be notified upon arrival.  Pt's nurse, Rayfield CitizenCaroline, has been notified.  Doylene Canninghomas Yashar Inclan, MA Triage Specialist [phone]

## 2015-06-28 NOTE — Consult Note (Signed)
[facility] Psychiatry Consult   Reason for Consult:  Schizoaffective disorder Referring Physician:  EDP Patient Identification: Teresa Bartlett MRN:  [id number] Principal Diagnosis: Schizoaffective disorder Diagnosis:   Patient Active Problem List   Diagnosis Date Noted  . Schizoaffective disorder [F25.9] 06/28/2015    Priority: High  . Kidney stone [N20.0] 04/25/2015  . Fibromyalgia [M79.7] 04/25/2015  . Left wrist pain [M25.532] 11/09/2014  . Fatigue [R53.83] 10/05/2014  . Polyarthralgia [M25.50] 10/05/2014  . Myalgia and myositis [M79.1, M60.9] 10/05/2014  . Vestibular migraine [G43.109] 08/24/2014  . Contact dermatitis [L25.9] 05/10/2014  . Palpitations [R00.2] 03/18/2014  . Benign paroxysmal positional vertigo [H81.10] 03/18/2014  . Right foot pain [M79.671] 11/16/2013  . Insomnia [G47.00] 10/26/2013  . Anxiety and depression [F41.8] 09/15/2013  . Routine general medical examination at a health care facility [Z00.00] 09/15/2013  . Left ankle injury [S99.912A] 07/29/2013  . Right shoulder pain [M25.511] 07/29/2013  . 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 Normal pregnancy [Z34.90] 06/15/2012    Total Time spent with patient: 1 hour  Subjective:   Teresa Bartlett is a 25 y.o. female patient admitted with Schizoaffective disorder.  HPI:  Caucasian female, 25 years old was evaluated for suicidal ideations.   Patient was recently discharged from [facility] [address].  Patient reported that she has been feeling suicidal since she started taking this medications.  She also stated that she started scratching her wrist and was bleeding.  Patient stated that she stopped taking Trilafon by her self and she is now feeling less suicidal.  Patient also sees Dr Parke Poisson at [facility] and is still seeing Dr Parke Poisson.  Patient has a known history of MDD, PTSD, CHRONIC, Dissociative disorder and panic attacks.  Patient is still seeing flying dogs and stated that she has been  dealing with Visual hallucinations. She denies SI/HI/AVH and she denies previous attempt.  Patient lives with her husband and reports therapeutic and supportive relationship.   Patient is now discharged home.  HPI Elements:   Location:  Schizoaffective disorder, Suicidal ideation, Hx of MDD, Dissociative disorder, Chronic PTSD and Panic attacks.. Quality:  severe. Severity:  severe. Timing:  Acute. Duration:  Chronic Mental illness. Context:  Brought self in for suicidal ideation..  Past Medical History:  Past Medical History  Diagnosis Date  . Hip dysplasia   . Sacroiliac joint dysfunction of both sides   . IBS (irritable bowel syndrome)   . Normal pregnancy 06/15/2012  . Depression   . Anxiety   . Asthma     "very mild"  . Complication of anesthesia     convulsion with epidural redose  . IDA (iron deficiency anemia)   . Cardiac dysrhythmia, unspecified   . Migraine, unspecified, without mention of intractable migraine without mention of status migrainosus   . Kidney stone   . Fibromyalgia January, 2016    Past Surgical History  Procedure Laterality Date  . Laparoscopic tubal ligation  09/16/2012    Procedure: LAPAROSCOPIC TUBAL LIGATION;  Surgeon: Logan Bores, MD;  Location: [facility];  Service: Gynecology;  Laterality: Bilateral;   Family History:  Family History  Problem Relation Age of Onset  . Diabetes Mother   . Arthritis Mother   . Kidney disease Mother   . Mental illness Mother   . Fibromyalgia Mother   . Heart disease Father   . Diabetes Father   . Hypertension Father   . Hyperlipidemia Father   . Mental illness Father     ptsd  and bipolar  . Heart attack Father   . Depression Father   . Heart disease Paternal Uncle   . Diabetes Maternal Grandmother   . Arthritis Maternal Grandmother   . Cancer Paternal Grandmother     colon  . Hyperlipidemia Paternal Grandmother   . Hypertension Paternal Grandmother   . Diabetes Paternal Grandfather   .  Hyperlipidemia Paternal Grandfather   . Hypertension Paternal Grandfather   . Mental illness Paternal Grandfather   . Heart disease Paternal Grandfather   . Other Neg Hx   . Migraines Mother   . Osteoporosis Mother    Social History:  History  Alcohol Use  . Yes    Comment: occasional      History  Drug Use No    History   Social History  . Marital Status: Married    Spouse Name: N/A  . Number of Children: N/A  . Years of Education: N/A   Social History Main Topics  . Smoking status: Former Smoker -- 2 years    Types: Cigarettes    Quit date: 06/05/2008  . Smokeless tobacco: Never Used  . Alcohol Use: Yes     Comment: occasional   . Drug Use: No  . Sexual Activity: Yes    Birth Control/ Protection: Surgical   Other Topics Concern  . None   Social History Narrative   She is a stay at home mother.   She lives with husband and two sons.   Highest level of education:  Two years of college   Additional Social History:                          Allergies:   Allergies  Allergen Reactions  . Benadryl [Diphenhydramine] Other (See Comments)    "its like speed"  . Other Hives    Glue used for EKG leads, other adhesives  . Saphris [Asenapine]     Labs:  Results for orders placed or performed during the hospital encounter of 06/27/15 (from the past 48 hour(s))  Urine rapid drug screen (hosp performed)     Status: None   Collection Time: 06/27/15  2:34 PM  Result Value Ref Range   Opiates NONE DETECTED NONE DETECTED   Cocaine NONE DETECTED NONE DETECTED   Benzodiazepines NONE DETECTED NONE DETECTED   Amphetamines NONE DETECTED NONE DETECTED   Tetrahydrocannabinol NONE DETECTED NONE DETECTED   Barbiturates NONE DETECTED NONE DETECTED    Comment:        DRUG SCREEN FOR MEDICAL PURPOSES ONLY.  IF CONFIRMATION IS NEEDED FOR ANY PURPOSE, NOTIFY LAB WITHIN 5 DAYS.        LOWEST DETECTABLE LIMITS FOR URINE DRUG SCREEN Drug Class       Cutoff  (ng/mL) Amphetamine      1000 Barbiturate      200 Benzodiazepine   474 Tricyclics       259 Opiates          300 Cocaine          300 THC              50   CBC with Differential/Platelet     Status: None   Collection Time: 06/27/15  3:56 PM  Result Value Ref Range   WBC 7.0 4.0 - 10.5 K/uL   RBC 4.10 3.87 - 5.11 MIL/uL   Hemoglobin 12.2 12.0 - 15.0 g/dL   HCT 36.4 36.0 - 46.0 %   MCV  88.8 78.0 - 100.0 fL   MCH 29.8 26.0 - 34.0 pg   MCHC 33.5 30.0 - 36.0 g/dL   RDW 13.6 11.5 - 15.5 %   Platelets 322 150 - 400 K/uL   Neutrophils Relative % 60 43 - 77 %   Neutro Abs 4.1 1.7 - 7.7 K/uL   Lymphocytes Relative 28 12 - 46 %   Lymphs Abs 2.0 0.7 - 4.0 K/uL   Monocytes Relative 7 3 - 12 %   Monocytes Absolute 0.5 0.1 - 1.0 K/uL   Eosinophils Relative 5 0 - 5 %   Eosinophils Absolute 0.4 0.0 - 0.7 K/uL   Basophils Relative 0 0 - 1 %   Basophils Absolute 0.0 0.0 - 0.1 K/uL  Comprehensive metabolic panel     Status: None   Collection Time: 06/27/15  3:56 PM  Result Value Ref Range   Sodium 138 135 - 145 mmol/L   Potassium 3.8 3.5 - 5.1 mmol/L   Chloride 104 101 - 111 mmol/L   CO2 27 22 - 32 mmol/L   Glucose, Bld 96 65 - 99 mg/dL   BUN 14 6 - 20 mg/dL   Creatinine, Ser 0.81 0.44 - 1.00 mg/dL   Calcium 9.4 8.9 - 10.3 mg/dL   Total Protein 7.8 6.5 - 8.1 g/dL   Albumin 4.4 3.5 - 5.0 g/dL   AST 41 15 - 41 U/L   ALT 52 14 - 54 U/L   Alkaline Phosphatase 63 38 - 126 U/L   Total Bilirubin 0.5 0.3 - 1.2 mg/dL   GFR calc non Af Amer >60 >60 mL/min   GFR calc Af Amer >60 >60 mL/min    Comment: (NOTE) The eGFR has been calculated using the CKD EPI equation. This calculation has not been validated in all clinical situations. eGFR's persistently <60 mL/min signify possible Chronic Kidney Disease.    Anion gap 7 5 - 15  Ethanol     Status: None   Collection Time: 06/27/15  4:18 PM  Result Value Ref Range   Alcohol, Ethyl (B) <5 <5 mg/dL    Comment:        LOWEST DETECTABLE LIMIT  FOR SERUM ALCOHOL IS 5 mg/dL FOR MEDICAL PURPOSES ONLY   Acetaminophen level     Status: Abnormal   Collection Time: 06/27/15  4:18 PM  Result Value Ref Range   Acetaminophen (Tylenol), Serum <10 (L) 10 - 30 ug/mL    Comment:        THERAPEUTIC CONCENTRATIONS VARY SIGNIFICANTLY. A RANGE OF 10-30 ug/mL MAY BE AN EFFECTIVE CONCENTRATION FOR MANY PATIENTS. HOWEVER, SOME ARE BEST TREATED AT CONCENTRATIONS OUTSIDE THIS RANGE. ACETAMINOPHEN CONCENTRATIONS >150 ug/mL AT 4 HOURS AFTER INGESTION AND >50 ug/mL AT 12 HOURS AFTER INGESTION ARE OFTEN ASSOCIATED WITH TOXIC REACTIONS.   Salicylate level     Status: None   Collection Time: 06/27/15  4:18 PM  Result Value Ref Range   Salicylate Lvl <2.6 2.8 - 30.0 mg/dL  POC urine preg, ED (not at [facility])     Status: None   Collection Time: 06/27/15  4:34 PM  Result Value Ref Range   Preg Test, Ur NEGATIVE NEGATIVE    Comment:        THE SENSITIVITY OF THIS METHODOLOGY IS >24 mIU/mL     Vitals: Blood pressure 116/73, pulse 99, temperature 97.6 F (36.4 C), temperature source Oral, resp. rate 18, last menstrual period 06/14/2015, SpO2 100 %.  Risk to Self: Is patient at risk  for suicide?: Yes Risk to Others:   Prior Inpatient Therapy:   Prior Outpatient Therapy:    Current Facility-Administered Medications  Medication Dose Route Frequency Provider Last Rate Last Dose  . acetaminophen (TYLENOL) tablet 650 mg  650 mg Oral Q4H PRN Domenic Moras, PA-C      . albuterol (PROVENTIL HFA;VENTOLIN HFA) 108 (90 BASE) MCG/ACT inhaler 2 puff  2 puff Inhalation Q6H PRN Domenic Moras, PA-C      . alum & mag hydroxide-simeth (MAALOX/MYLANTA) 200-200-20 MG/5ML suspension 30 mL  30 mL Oral PRN Domenic Moras, PA-C      . benztropine (COGENTIN) tablet 1 mg  1 mg Oral TID Domenic Moras, PA-C   1 mg at 06/28/15 0837  . busPIRone (BUSPAR) tablet 10 mg  10 mg Oral TID Domenic Moras, PA-C   10 mg at 06/28/15 0837  . desvenlafaxine (PRISTIQ) 24 hr tablet 100 mg  100 mg Oral  Daily Domenic Moras, PA-C   100 mg at 06/28/15 0836  . ibuprofen (ADVIL,MOTRIN) tablet 600 mg  600 mg Oral Q8H PRN Domenic Moras, PA-C   600 mg at 06/28/15 0646  . lithium carbonate (LITHOBID) CR tablet 300 mg  300 mg Oral Daily Daleen Bo, MD   300 mg at 06/28/15 0837  . lithium carbonate (LITHOBID) CR tablet 600 mg  600 mg Oral QHS Daleen Bo, MD   600 mg at 06/27/15 2134  . LORazepam (ATIVAN) tablet 1 mg  1 mg Oral Q8H PRN Domenic Moras, PA-C   1 mg at 06/28/15 1104  . ondansetron (ZOFRAN) tablet 4 mg  4 mg Oral Q8H PRN Domenic Moras, PA-C      . QUEtiapine (SEROQUEL) tablet 800 mg  800 mg Oral QHS Patrecia Pour, NP   800 mg at 06/27/15 2136  . zolpidem (AMBIEN) tablet 5 mg  5 mg Oral QHS PRN Domenic Moras, PA-C       Current Outpatient Prescriptions  Medication Sig Dispense Refill  . acetaminophen (TYLENOL) 325 MG tablet Take 650 mg by mouth every 6 (six) hours as needed for headache.    . albuterol (PROVENTIL HFA;VENTOLIN HFA) 108 (90 BASE) MCG/ACT inhaler Inhale 2 puffs into the lungs every 6 (six) hours as needed for wheezing or shortness of breath.    . benztropine (COGENTIN) 1 MG tablet Take 1 mg by mouth 3 (three) times daily.     . busPIRone (BUSPAR) 10 MG tablet Take 10 mg by mouth 3 (three) times daily.    Marland Kitchen desvenlafaxine (PRISTIQ) 50 MG 24 hr tablet Take 100 mg by mouth daily.     Marland Kitchen HYDROcodone-acetaminophen (NORCO/VICODIN) 5-325 MG per tablet Take 1-2 tablets by mouth every 6 (six) hours as needed. 20 tablet 0  . lithium 300 MG tablet Take 300-600 mg by mouth 2 (two) times daily. 1 tab in the morning and 2 tab in the evening    . LORazepam (ATIVAN) 1 MG tablet Take 1 mg by mouth daily as needed for anxiety.   0  . ondansetron (ZOFRAN ODT) 4 MG disintegrating tablet Take 1 tablet (4 mg total) by mouth every 8 (eight) hours as needed for nausea or vomiting. 20 tablet 0  . perphenazine (TRILAFON) 2 MG tablet Take 2 mg by mouth 2 (two) times daily. Patient unsure of dosage    . QUEtiapine  (SEROQUEL) 400 MG tablet Take 800 mg by mouth daily.     Marland Kitchen lithium carbonate (LITHOBID) 300 MG CR tablet Take 2 tablets (600 mg total)  by mouth daily. 60 tablet 0  . lithium carbonate (LITHOBID) 300 MG CR tablet Take 1 tablet (300 mg total) by mouth at bedtime. 30 tablet 0  . pregabalin (LYRICA) 75 MG capsule Take 1 capsule (75 mg total) by mouth 2 (two) times daily. (Patient not taking: Reported on 06/27/2015) 60 capsule 3    Musculoskeletal: Strength & Muscle Tone: within normal limits Gait & Station: normal Patient leans: N/A  Psychiatric Specialty Exam: Physical Exam  Review of Systems  Constitutional: Negative.   HENT: Negative.   Eyes: Negative.   Respiratory: Negative.   Cardiovascular: Negative.   Gastrointestinal: Negative.   Genitourinary: Negative.   Musculoskeletal: Negative.   Skin: Negative.   Neurological: Negative.   Endo/Heme/Allergies: Negative.     Blood pressure 116/73, pulse 99, temperature 97.6 F (36.4 C), temperature source Oral, resp. rate 18, last menstrual period 06/14/2015, SpO2 100 %.There is no weight on file to calculate BMI.  General Appearance: Casual  Eye Contact::  Good  Speech:  Clear and Coherent and Normal Rate  Volume:  Normal  Mood:  Anxious and Depressed  Affect:  Congruent  Thought Process:  Coherent  Orientation:  Full (Time, Place, and Person)  Thought Content:  WDL  Suicidal Thoughts:  No  Homicidal Thoughts:  No  Memory:  Immediate;   Good Recent;   Good Remote;   Good  Judgement:  Good  Insight:  Good  Psychomotor Activity:  Normal  Concentration:  Good  Recall:  NA  Fund of Knowledge:Good  Language: Good  Akathisia:  NA  Handed:  Right  AIMS (if indicated):     Assets:  Desire for Improvement  ADL's:  Intact  Cognition: WNL  Sleep:      Medical Decision Making: Established Problem, Stable/Improving (1)  Disposition: Discharge  Home, follow up with PHP under Dr Parke Poisson, Follow up with your Outpatient Psychiatrist  Provider  Delfin Gant   PMHNP-BC 06/28/2015 11:39 AM Patient seen face-to-face for psychiatric evaluation, chart reviewed and case discussed with the physician extender and developed treatment plan. Reviewed the information documented and agree with the treatment plan. Corena Pilgrim, MD

## 2015-06-28 NOTE — Telephone Encounter (Signed)
See previous documentation, please document and close / Sherri S.

## 2015-06-28 NOTE — Progress Notes (Signed)
Adult Psychoeducational Group Note  Date:  06/28/2015 Time:  5:19 PM  Group Topic/Focus: Building Self Esteem:   The Focus of this group is helping patients become aware of the effects of self-esteem on their lives, the things they and others do that enhance or undermine their self-esteem, seeing the relationship between their level of self-esteem and the choices they make and learning ways to enhance self-esteem. Coping With Mental Health Crisis:   The purpose of this group is to help patients identify strategies for coping with mental health crisis.  Group discusses possible causes of crisis and ways to manage them effectively. Developing a Wellness Toolbox:   The focus of this group is to help patients develop a "wellness toolbox" with skills and strategies to promote recovery upon discharge. Recovery Goals:   The focus of this group is to identify appropriate goals for recovery and establish a plan to achieve them.  Participation Level:  Active  Participation Quality:  Attentive  Affect:  Anxious and Appropriate  Cognitive:  Alert  Insight: Improving  Engagement in Group:  Engaged and Monopolizing  Modes of Intervention:  Activity, Discussion, Exploration, Problem-solving and Support  Additional Comments:  Patients were taught and asked to demonstrate their problem-solving skills. Patients were asked to define a problem clearly, brainstorming multiple solutions, listing the pros and cons of each solution, seeking input from others, selecting and implementing a plan of action, and evaluating and readjusting the outcome. Patients were asked to discuss scenarios in which they have been prone to social anxiety. Clients were assisted with exploring the stressors and creating coping skills applicable to the issue. Patients were asked to discuss, in detail, one large obstacle they would like to overcome. Each patient goal was explored, accessed for risk, problem solved and verbalized as a  commitment by the patient. Patient was able to display a clear understanding of the use of problem-solving skills as necessary to decrease anxiety symptoms.    Teresa Bartlett expressed her goal to "get my kids back" during the group. She reported that she and her husband discussed his expectations about having the kids return home.  He clarified with her what he would like to see in terms of stabilization. Teresa Bartlett stated that this helps her quite a bit and expressed that his terms were "extremely reasonable." She states she is encouraged that her medicine has been changed and reports that she would like to begin working on the plan she and her spouse have developed stating today.   Teresa Bartlett 06/28/2015, 5:19 PM

## 2015-06-28 NOTE — [facility] Suicide Risk Assessment (Cosign Needed)
Suicide Risk Assessment  Discharge Assessment   [facility]   Demographic Factors:  Adolescent or young adult, Caucasian and Low socioeconomic status  Total Time spent with patient: 20 minutes  Musculoskeletal: Strength & Muscle Tone: within normal limits Gait & Station: normal Patient leans: N/A  Psychiatric Specialty Exam:     Blood pressure 116/73, pulse 99, temperature 98 F (36.7 C), temperature source Oral, resp. rate 18, last menstrual period 06/14/2015, SpO2 100 %.There is no weight on file to calculate BMI.  General Appearance: Casual  Eye Contact::  Good  Speech:  Clear and Coherent and Normal Rate  Volume:  Normal  Mood:  Anxious and Depressed  Affect:  Congruent  Thought Process:  Coherent  Orientation:  Full (Time, Place, and Person)  Thought Content:  WDL  Suicidal Thoughts:  No  Homicidal Thoughts:  No  Memory:  Immediate;   Good Recent;   Good Remote;   Good  Judgement:  Good  Insight:  Good  Psychomotor Activity:  Normal  Concentration:  Good  Recall:  NA  Fund of Knowledge:Good  Language: Good  Akathisia:  NA  Handed:  Right  AIMS (if indicated):     Assets:  Desire for Improvement  ADL's:  Intact  Cognition: WNL  Sleep:           Has this patient used any form of tobacco in the last 30 days? (Cigarettes, Smokeless Tobacco, Cigars, and/or Pipes) N/A  Mental Status Per Nursing Assessment::   On Admission:     Current Mental Status by Physician: NA  Loss Factors: NA  Historical Factors: NA  Risk Reduction Factors:   Responsible for children under 27 years of age, Sense of responsibility to family, Living with another person, especially a relative and Positive therapeutic relationship  Continued Clinical Symptoms:  Depression:   Insomnia Previous Psychiatric Diagnoses and Treatments  Cognitive Features That Contribute To Risk:  Polarized thinking    Suicide Risk:  Minimal: No identifiable suicidal  ideation.  Patients presenting with no risk factors but with morbid ruminations; may be classified as minimal risk based on the severity of the depressive symptoms  Principal Problem: Schizoaffective disorder Discharge Diagnoses:  Patient Active Problem List   Diagnosis Date Noted  . Schizoaffective disorder [F25.9] 06/28/2015    Priority: High  . Suicidal ideation [R45.851]   . Kidney stone [N20.0] 04/25/2015  . Fibromyalgia [M79.7] 04/25/2015  . Left wrist pain [M25.532] 11/09/2014  . Fatigue [R53.83] 10/05/2014  . Polyarthralgia [M25.50] 10/05/2014  . Myalgia and myositis [M79.1, M60.9] 10/05/2014  . Vestibular migraine [G43.109] 08/24/2014  . Contact dermatitis [L25.9] 05/10/2014  . Palpitations [R00.2] 03/18/2014  . Benign paroxysmal positional vertigo [H81.10] 03/18/2014  . Right foot pain [M79.671] 11/16/2013  . Insomnia [G47.00] 10/26/2013  . Anxiety and depression [F41.8] 09/15/2013  . Routine general medical examination at a health care facility [Z00.00] 09/15/2013  . Left ankle injury [S99.912A] 07/29/2013  . Right shoulder pain [M25.511] 07/29/2013  . 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 Normal pregnancy [Z34.90] 06/15/2012      Plan Of Care/Follow-up recommendations:  Activity:  as tolerated Diet:  regular  Is patient on multiple antipsychotic therapies at discharge:  No   Has Patient had three or more failed trials of antipsychotic monotherapy by history:  No  Recommended Plan for Multiple Antipsychotic Therapies: NA    Teresa Bartlett C    PMHNP-BC 06/28/2015, 12:03 PM

## 2015-06-29 ENCOUNTER — Ambulatory Visit (HOSPITAL_COMMUNITY): Payer: Self-pay | Admitting: Licensed Clinical Social Worker

## 2015-06-29 ENCOUNTER — Other Ambulatory Visit (HOSPITAL_COMMUNITY): Payer: BLUE CROSS/BLUE SHIELD | Admitting: Licensed Clinical Social Worker

## 2015-06-29 DIAGNOSIS — F3164 Bipolar disorder, current episode mixed, severe, with psychotic features: Secondary | ICD-10-CM

## 2015-06-29 DIAGNOSIS — F333 Major depressive disorder, recurrent, severe with psychotic symptoms: Secondary | ICD-10-CM | POA: Diagnosis not present

## 2015-06-29 DIAGNOSIS — F4001 Agoraphobia with panic disorder: Secondary | ICD-10-CM

## 2015-06-29 DIAGNOSIS — F4310 Post-traumatic stress disorder, unspecified: Secondary | ICD-10-CM

## 2015-06-29 NOTE — Progress Notes (Addendum)
Recreation Therapy Note   Date: 06/29/15  Time: 1330-1500  Location: PHP Group Room 1  Goal: Patient will be able to successfully create a collage representing their lives.  Patient will be able to identify 3 things they accept and resist in their lives.  Patient will be open to engage in relaxation exercises using T'ai Chi and Qigong.  Behavioral Response: Engaged, attentive, sharing  Activity: Accepting and Embracing Life. Given a packet with a variety of art materials, pt will create a collage that represents their life.  Pt will become aware of things they accept and things they resist.  Education: Life Skills of acceptance, forgiveness, and resistance.  Education Outcome: Acknowledges understanding / Increased Awareness  Clinical Observations / Feedback: Patient was very open to the warm-up exercises of T'ai Chi and Qigong.  Pt shared that she used breathing exercises to manage an anxiety attack.  Pt created a collage using the art materials and shared that she wore a mask and was afraid to reveal her authenticity.  Pt's collage depicted love of family, times she had self-harmed, and paths she is taking take care of herself.  Pt also created a "[employer] Stick" from the materials and reported to the group that she had one when she was little and the sound it made was very comforting to her.  Pt shared that she has people in her life that accept her just the way she is but does not always feel deserving of this love.  Pt also shared she is exploring ways of managing her fibromyalgia and [facility] her [facility].  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  Pt shared with staff and peers that she has been fully accepted by the older women there... "just the way I am".  Pt plans to do the "[place] [employer]" T'ai Chi exercise in the morning and plans to celebrate her  son's birthday this week.  Pt appeared very receptive to treatment demonstrating a brighter affect at the end of the group session.  Landis MartinsNancy Casimir Barcellos, LRT/CTRS

## 2015-06-29 NOTE — Psych (Signed)
   [facility]  Teresa Bartlett [id number]   Date: 06/29/15   Time: 11:00 AM-12:00 PM   Group Topic/Focus:  Group topic for today was personal responsibility, empowerment and self-care.  Participation Level:  Active  Participation Quality:  Appropriate and Sharing  Affect:  Appropriate  Cognitive:  Appropriate  Insight: Improving  Engagement in Group:  Engaged  Modes of Intervention:  Discussion, Education, Exploration and CBT  Additional Comments: Group topic for today was personal responsibility, empowerment and self-care. Group started with a check in. The discussion was very helpful in thought restructuring for patient's to recognize that they have more power than they realize to impact their lives. This is empowering for them and helpful for symptom management. Teresa Bartlett reported a change in perspective that she feels is helping her to move forward including that she "wants to take charge of my life and my mental health". She was led through intervention to see that even though she is experiencing mental health symptoms she is still exercising self-care through identifying the problem, identifying how she feels about the problems and envisioning a solution. By patient gaining a perspective of empowerment, she was learning a way she can manage symptoms.   Suicidal/Homicidal: no  Plan:1.Patient use empowerment strategies by taking control of life and symptoms to help improve symptoms.2.Patient continue to take positive steps to build a healthier lifestyle.   [facility] PHP THERAPIST GROUP NOTE  Date: 06/29/15  Time: 12:30 PM-1:30 PM  Group Topic/Focus:  CBT-empowerment for symptoms management and envisioning a hopeful life  Participation Level:  Active  Participation Quality:  Appropriate  Affect:  Appropriate  Cognitive:  Appropriate  Insight: Improving  Engagement in Group:  Engaged  Modes of Intervention:  Activity, Discussion, Education,  Exploration and Reframing, CBT  Additional Comments: Group topic continued on the topic of empowerment. Videos were selected by patients, including one by [facility] titled "Regrets" and the overall message was to inspire patients to have a perspective of making the most of their lives. Patients discussed some of the things that they heard that made an impact and this included not to look back at your life with regrets, doubt is the enemy, that we are born with a gift and let our dreams find us. The videos gave a positive message and were empowering for patients. The messages helps in framing a perspective for patients that will help in managing symptoms and help in envisioning a more positive life for themselves. It was also positive that the patients picked the videos that sent inspiring messages. Teresa Bartlett participated actively in picking videos and discussing the videos in the group.   Suicidal/Homicidal: no  Plan: 1.Teresa Bartlett use insights from empowerment to help in symptoms management and to make progress in treatment.2.Teresa Bartlett continue to take steps to create a healthy lifestyle.      Diagnosis: Primary Diagnosis: Bipolar I disorder, most recent episode mixed, severe with psychotic features [F31.64]    1. Bipolar I disorder, most recent episode mixed, severe with psychotic features   2. Panic disorder with agoraphobia   3. PTSD (post-traumatic stress disorder)       Sharyn Brilliant A 06/29/2015

## 2015-06-29 NOTE — Progress Notes (Signed)
Group Progress Note  Program: Partial Hospitalization 06/28/15 Group Time: 1.30pm-2.30pm Participation Level: Minimal  Behavioral Response: Affect Depressed- Minimal response  Type of Therapy:  Yoga Skills Group  Counselor led group through grounding yoga mat class with focus on mindful movement and breath work w/ use of mantra meditation.  Focuses was on grounding to present moment and feeling supported in present while noticing movement, sensations and breathing.  Provided psychoeducation re: benefits of practice and how to use for self.   Summary of Progress: Pt joined group and reported that she might sit out if too much on her body as she reports that she has a lot of hip pain today.  Pt reported she is also very tired as was in ED last night and minimal sleep.  Pt participated for about 10 minutes then left briefly to acquire keys brought by husband.  Pt rejoined and stated she felt best if didn't particpate in movement today.  Pt did stay engaged and reflected on poses and what her home practice includes.  Pt did rejoin group for restorative poses and discussed how this has benefited her over the years and how she uses this for self at home.    Forde RadonLeanne Jerni Selmer, LPC

## 2015-06-29 NOTE — Progress Notes (Signed)
Date: 06/28/15 Time of encounter: 2:30 PM - 4:00 PM  Purpose: To address group goal of dealing with social anxiety. Intervention: Explored possible scenarios of social opportunities, attempting to find what would make socializing more comfortable. Effect: She realized that using humor as a communication tool "breaks the ice", making her more comfortable to explore social relationships.  Donnamarie Rossettiodd Henley Blyth CPSS

## 2015-06-29 NOTE — Progress Notes (Signed)
Adult Psychoeducational Group Note  Date:  06/29/2015 Time:  5:18 PM  Group Topic/Focus: Coping With Mental Health Crisis:   The purpose of this group is to help patients identify strategies for coping with mental health crisis.  Group discusses possible causes of crisis and ways to manage them effectively. Dimensions of Wellness:   The focus of this group is to introduce the topic of wellness and discuss the role each dimension of wellness plays in total health.  Participation Level:  Active  Participation Quality:  Attentive  Affect:  Anxious  Cognitive:  Alert  Insight: Limited  Engagement in Group:  Defensive, Distracting and Monopolizing  Modes of Intervention:  Activity, Clarification, Discussion, Exploration and Support  Additional Comments:  Therapist asked patients to list their most important past and present life conflicts that continually contribute to feelings of worry and anxiety. Therapist aided patients in developing insight into their lists of life conflicts that trigger anxiety. Therapist assisted patients in being able to clarify causes for worry and to put them into better perspective. Therapist was able to make a connection between patient life conflicts and anxiety/worry by clarifying, questioning, probing, and challenging negative thoughts and belief patterns  Allana Shrestha 06/29/2015, 5:18 PM

## 2015-06-29 NOTE — Progress Notes (Signed)
Date: 06/29/15 Time of encounter: 11:00 AM - 12:00 PM  Purpose: To address symptoms that yield negativity through concepts of personal responsibility and empowerment. Intervention: Asserted that change begins with self, reinforcing concepts of personal responsibility. Added ideas that empowerment comes from many skill-set applications. Effect: Teresa Bartlett realized that any cessation in symptoms of hearing voices can provide the foundation for effective, empowered participation in life as she envisions it.  Donnamarie Rossettiodd [name] CPSS

## 2015-06-30 ENCOUNTER — Ambulatory Visit (HOSPITAL_COMMUNITY): Payer: Self-pay | Admitting: Licensed Clinical Social Worker

## 2015-06-30 ENCOUNTER — Other Ambulatory Visit (HOSPITAL_COMMUNITY): Payer: BLUE CROSS/BLUE SHIELD | Admitting: Licensed Clinical Social Worker

## 2015-06-30 DIAGNOSIS — F4001 Agoraphobia with panic disorder: Secondary | ICD-10-CM

## 2015-06-30 DIAGNOSIS — F3164 Bipolar disorder, current episode mixed, severe, with psychotic features: Secondary | ICD-10-CM

## 2015-06-30 DIAGNOSIS — F4310 Post-traumatic stress disorder, unspecified: Secondary | ICD-10-CM

## 2015-06-30 DIAGNOSIS — F333 Major depressive disorder, recurrent, severe with psychotic symptoms: Secondary | ICD-10-CM | POA: Diagnosis not present

## 2015-06-30 NOTE — Psych (Signed)
[facility] PHP THERAPIST PROGRESS NOTE  Cherysh Epperly [id number]   [facility] PHP THERAPIST GROUP NOTE  Date: 06/30/15 Time: 11:00 AM-12:30 PM    Group Topic/Focus:  Strategies to Manage Anxiety  Participation Level:  Active  Participation Quality:  Sharing  Affect:  Appropriate  Cognitive:  Appropriate  Insight: Improving  Engagement in Group:  Engaged  Modes of Intervention:  Discussion, Education, Exploration, Problem-solving, Support and Coping Skills  Additional Comments: Group topic related to coping strategies to manage anxiety. Group started with a check in. Patients discussed how finding an activity after the group to help to destress themselves would be helpful. Therapist pointed out that one helpful strategy is to as whether the thoughts and behaviors are helping you to function or not. We also discussed ways they can work their symptom to their advantage. Therapist discussed how structure is important to give a foundation especially when they are not feeling centered and not feel they have a solid foundation yet. Therapist explained that discussion of anxieties can decrease the client's level of stress and anxiety and identifying the source can help you begin to problem solve that will help decrease stress. Patients discussed some social networks they were building and therapist pointed out that this gives them support and a network in place for when they are done with therapy. Allie discussed how symptoms have improved including not hearing voices, no self-harm, no SI, is calmer and feels that medication adjustments were significant in this change. She feels her swimming class is helping her as they welcome her and it is a positive space. She is beginning to challenge voices in her head to ask if they are helpful or not. She also discussed how the symptoms are helping her by saying that she "makes my voices love me". She feels good as well because the kids are coming home for  the weekend and my impression is that this helps her to see the progress she is making. She needs to continue to work on self-esteem building activities for management of symptoms.   Suicidal/Homicidal: no  Plan: 1.Allie implement strategies to manage anxiety. 2.Allie continue to implement strategies to build self-esteem.  [facility] PHP THERAPIST GROUP NOTE  Date: 06/30/15  Time: 1:00 PM-2:00 PM  Group Topic/Focus:  Reslience  Participation Level:  Active  Participation Quality:  Appropriate  Affect:  Appropriate  Cognitive:  Appropriate  Insight: Improving  Engagement in Group:  Engaged  Modes of Intervention:  Activity, Discussion, Education, Exploration, Problem-solving, Support and Coping, Self-Esteem Building  Additional Comments: Group topic was resilience. The group members identified the meaning of resilience and ways they can practice resilience. The patients discussed how hurdles they encounter to their goals were part of the necessary process of growth. Group discussed how taking one step at a time may be how they need to approach growth and change. The group discussed how practicing resilience can make the day a more positive one. This practice means looking at the present in a more positive way and the group discussed practices that help them to do this. In the discussion they realize that life is experienced in the present moment so it is important to develop practices that help them to appreciate and enjoy the present moment. Allie related that activity of resilience helped her to feel better about herself in seeing ways she has overcome her past. She discussed that she is coming to terms with being okay that she is a a positive person  so she is able to have positive experiences when she reminds herself of being positive. She also discussed mindfulness strategies to be more present focused.     Suicidal/Homicidal: no  Plan: 1.Patient apply insights learned about resilience  to practice and build resilience in her life.2.Patient continue to develop self-esteem through recognition of strengths.   Diagnosis: Primary Diagnosis: Bipolar I disorder, most recent episode mixed, severe with psychotic features [F31.64]    1. Bipolar I disorder, most recent episode mixed, severe with psychotic features   2. Panic disorder with agoraphobia   3. PTSD (post-traumatic stress disorder)       Bowman,Mary A 06/30/2015

## 2015-06-30 NOTE — Progress Notes (Signed)
Date: 06/30/15 Time of encounter: 1:00 PM - 2:00 PM  Purpose: Exploring concepts of resilience to address emotional issues and symptom prevalence. Intervention: Discussed elements of resilience through personal definitions and examples. Affirmed self-care as a major contributing factor in enhancing resilience. Effect: Teresa Bartlett concluded that "forgiving self", to reframe present flaws, as essential to self-care and "bouncing back" from adversity more quickly.  Teresa Bartlett CPSS

## 2015-06-30 NOTE — Progress Notes (Signed)
Adult Psychoeducational Group Note  Date:  06/30/2015 Time:  4:41 PM  Group Topic/Focus: Developing a Wellness Toolbox:   The focus of this group is to help patients develop a "wellness toolbox" with skills and strategies to promote recovery upon discharge. Emotional Education:   The focus of this group is to discuss what feelings/emotions are, and how they are experienced. Overcoming Stress:   The focus of this group is to define stress and help patients assess their triggers.  Participation Level:  Active  Participation Quality:  Redirectable and Sharing  Affect:  Anxious and Defensive  Cognitive:  Alert  Insight: Limited  Engagement in Group:  Engaged and Monopolizing  Modes of Intervention:  Activity, Exploration, Orientation and Support  Additional Comments:   Patients were taught about defining the problem clearly, brainstorming multiple solutions, listing the pros and cons of each solution, seeking input from others, selecting and implementing a plan of action, and evaluating and readjusting the outcome. Patients displayed a clear understanding of the use of the problem-solving skills, and were asked to provide examples. Patients were asked about how they are implementing calming and grounding techniques in their daily life and when these techniques are used. Patients were asked to discuss a time or times when excessive worry was beyond the scope of rationality and they felt unable to control it. Patients were assisted in exploring worries concerning issues related to family, personal safety, health, and employment, among other things. Patients were taught how to recognize, stop, and postpone worry until the agreed upon worry time. Patients were given an activity and were shown how to respond to issues by utilizing techniques such as: thought stopping, relaxation, and redirection of attention. Patients were taught how to use the Mindfulness exercise of Body Scanning to emphasize total  body relaxation. Each patient was provided with feedback on how they were planning to use the techniques in their daily lives. Jakyrah Holladay 06/30/2015, 4:41 PM

## 2015-07-01 ENCOUNTER — Ambulatory Visit (HOSPITAL_COMMUNITY): Payer: Self-pay | Admitting: Licensed Clinical Social Worker

## 2015-07-01 ENCOUNTER — Other Ambulatory Visit (HOSPITAL_COMMUNITY): Payer: BLUE CROSS/BLUE SHIELD | Attending: Psychiatry | Admitting: Licensed Clinical Social Worker

## 2015-07-01 DIAGNOSIS — F4310 Post-traumatic stress disorder, unspecified: Secondary | ICD-10-CM

## 2015-07-01 DIAGNOSIS — G4700 Insomnia, unspecified: Secondary | ICD-10-CM | POA: Diagnosis not present

## 2015-07-01 DIAGNOSIS — Z87891 Personal history of nicotine dependence: Secondary | ICD-10-CM | POA: Diagnosis not present

## 2015-07-01 DIAGNOSIS — M797 Fibromyalgia: Secondary | ICD-10-CM | POA: Diagnosis not present

## 2015-07-01 DIAGNOSIS — F315 Bipolar disorder, current episode depressed, severe, with psychotic features: Secondary | ICD-10-CM | POA: Diagnosis present

## 2015-07-01 DIAGNOSIS — F259 Schizoaffective disorder, unspecified: Secondary | ICD-10-CM | POA: Insufficient documentation

## 2015-07-01 DIAGNOSIS — F419 Anxiety disorder, unspecified: Secondary | ICD-10-CM | POA: Insufficient documentation

## 2015-07-01 DIAGNOSIS — F3164 Bipolar disorder, current episode mixed, severe, with psychotic features: Secondary | ICD-10-CM

## 2015-07-01 DIAGNOSIS — F4001 Agoraphobia with panic disorder: Secondary | ICD-10-CM

## 2015-07-01 NOTE — Progress Notes (Signed)
Adult Psychoeducational Group Note  Date:  07/01/2015 Time:  4:34 PM  Group Topic/Focus: Conflict Resolution:   The focus of this group is to discuss the conflict resolution process and how it may be used upon discharge. Healthy Communication:   The focus of this group is to discuss communication, barriers to communication, as well as healthy ways to communicate with others.  Participation Level:  Active  Participation Quality:  Attentive, Sharing and Supportive  Affect:  Appropriate  Cognitive:  Appropriate  Insight: Good  Engagement in Group:  Engaged  Modes of Intervention:  Activity, Discussion, Problem-solving, Role-play and Support  Additional Comments:  Patients were taught and asked to demonstrate their problem-solving skills. Patients were asked to define a problem clearly, brainstorming multiple solutions, role-playing, listing the pros and cons of each solution, seeking input from others, selecting and implementing a plan of action, and evaluating and readjusting the outcome. Patients were asked to discuss scenarios in which they have been prone to social anxiety. Clients were assisted with exploring the stressors and creating coping skills applicable to the issue. Patients were asked to discuss, in detail, one large obstacle they would like to overcome. Each patient goal was explored, accessed for risk, problem solved and verbalized as a commitment by the patient. Patient was able to display a clear understanding of the use of problem-solving skills as necessary to decrease anxiety symptoms.    Crit Obremski 07/01/2015, 4:34 PM

## 2015-07-01 NOTE — Progress Notes (Signed)
Date: 07/01/15 Time of encounter: 1:00 PM - 3:00 PM  Purpose: Addressing relevant issues currently experienced by "working through" them methodically. Intervention: Asked group members to isolate current issues with others and get in touch with resultant feelings. Effect: Maryjayne concluded that conflict often comes from a place of illness, and decided to utilize empathy as a recovery tool.  Donnamarie Rossettiodd Dreshaun Stene CPSS
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
## 2015-07-02 NOTE — Progress Notes (Addendum)
06/28/15  Medication Management Note    Dx PTSD Panic Disorder Major Depression, Recurrent, with psychotic features, versus Bipolar Disorder II most recently episode depressed  Consider Borderline Personality Disorder Features   Duration - 20 minutes   Subjective - States " I am doing better now".  States she recently went to ED due to increased depression, panic symptoms , passive SI, but felt better and it was not felt she needed admission. Reports that now she is feeling much better, which she attributes to being off Trilafon.  Reports " that medication was not agreeing with me, it was making me feel worse "  Objective -  I have discussed case with staff and have met with patient Patient reports feeling better,and attributes this to being off Trilafon. She states anxiety, which is chronic, has subsided partially and no panic attacks today. Mood is less depressed- affect is reactive .  She denies current medication side effects. Today denies any suicidal ideations or self injurious behaviors. She reports she is benefiting from group therapies .   Current Medications- ( now off  Trilafon)  LiC03 600 mgrs AM and 300 mgrs HS Pristiq 100 mgrs QDAY Seroquel 800 mgrs QHS  Cogentin 1 mgr QDAY    ROS- denies nausea , denies vomiting , denies palpitations, denies headache   MSE -  Today alert and attentive, well related, well groomed, good eye contact, speech normal in tone, volume and rate ,describes mood as improved , affect is appropriate, reactive . There is  no thought disorder, denies any suicidal or homicidal ideations at this time,denies  psychotic symptoms and does not appear internally preoccupied . 0x3.   Assessment- Improved mood /affect . Recent increased anxiety , depression, vague SI, which led to her going to ED. Was evaluated and discharged, as she states she felt better. At this time not suicidal , not psychotic, and mood seems improved. States she feels better off  Trilafon. No current indication for Cogentin as off Trilafon and not presenting with any EPS.   Plan- continue PHP With the aim of fostering further improvement and to further improve coping skills and  Ego strengths  Continue medications as above. D/C Cogentin.  D/C Trilafon  Does not currently need scripts    Neita Garnet, MD

## 2015-07-04 ENCOUNTER — Other Ambulatory Visit (HOSPITAL_COMMUNITY): Payer: Self-pay

## 2015-07-05 ENCOUNTER — Ambulatory Visit (HOSPITAL_COMMUNITY): Payer: Self-pay | Admitting: Licensed Clinical Social Worker

## 2015-07-05 ENCOUNTER — Other Ambulatory Visit (HOSPITAL_COMMUNITY): Payer: BLUE CROSS/BLUE SHIELD | Admitting: Licensed Clinical Social Worker

## 2015-07-05 ENCOUNTER — Telehealth (HOSPITAL_COMMUNITY): Payer: Self-pay | Admitting: Licensed Clinical Social Worker

## 2015-07-05 DIAGNOSIS — F4001 Agoraphobia with panic disorder: Secondary | ICD-10-CM

## 2015-07-05 DIAGNOSIS — F4310 Post-traumatic stress disorder, unspecified: Secondary | ICD-10-CM

## 2015-07-05 DIAGNOSIS — F3164 Bipolar disorder, current episode mixed, severe, with psychotic features: Secondary | ICD-10-CM

## 2015-07-05 DIAGNOSIS — F315 Bipolar disorder, current episode depressed, severe, with psychotic features: Secondary | ICD-10-CM | POA: Diagnosis not present

## 2015-07-05 NOTE — Progress Notes (Signed)
1 
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
## 2015-07-05 NOTE — Progress Notes (Signed)
Adult Psychoeducational Group Note  Date:  07/05/2015 Time:  4:59 PM  Group Topic/Focus: Coping With Mental Health Crisis:   The purpose of this group is to help patients identify strategies for coping with mental health crisis.  Group discusses possible causes of crisis and ways to manage them effectively. Conflict Resolution:   The focus of this group is to discuss the conflict resolution process and how it Teresa be used upon discharge. Developing a Wellness Toolbox:   The focus of this group is to help patients develop a "wellness toolbox" with skills and strategies to promote recovery upon discharge.  Participation Level:  Active  Participation Quality:  Appropriate and Monopolizing  Affect:  Appropriate and Depressed  Cognitive:  Alert  Insight: Appropriate and Good  Engagement in Group:  Engaged  Modes of Intervention:  Discussion, Exploration, Limit-setting, Problem-solving, Rapport Building and Support  Additional Comments: "Teresa Bartlett" was encouraged to set aside a specific time-limited period to focus on mourning the loss of the relationship with her mother. She was reinforced as she agreed to follow through on establishing a specific timeframe to focus on the feelings of grief surrounding the loss. She expressed some possible difficulty with the idea of "cutting off" in order to facilitate the grieving process. Therapist assisted her with processing feelings of grief, loss and disappointment by utilizing the metaphor of a burning barn in which she  is forced to save herself (become unmeshed from her mother) or sacrifice (continue to pursue an unhealthy or even toxic relationship) even though the kind gesture will come at a cost for others closest to them (her children). She was assisted in developing an act of penitence for her feelings of having failed her immediate family by continuing to neglect them in pursuit of love from others who do not wish to return love to her.(parents and  in-laws)   Teresa Bartlett 07/05/2015, 4:59 PM

## 2015-07-06 ENCOUNTER — Other Ambulatory Visit (HOSPITAL_COMMUNITY): Payer: Self-pay

## 2015-07-06 ENCOUNTER — Telehealth (HOSPITAL_COMMUNITY): Payer: Self-pay

## 2015-07-06 ENCOUNTER — Other Ambulatory Visit (HOSPITAL_COMMUNITY): Payer: BLUE CROSS/BLUE SHIELD | Admitting: Licensed Clinical Social Worker

## 2015-07-06 ENCOUNTER — Inpatient Hospital Stay (HOSPITAL_COMMUNITY)
Admission: AD | Admit: 2015-07-06 | Discharge: 2015-07-12 | DRG: 885 | Disposition: A | Discharge status: Home / Self Care | Payer: BLUE CROSS/BLUE SHIELD | Attending: Psychiatry | Admitting: Psychiatry

## 2015-07-06 ENCOUNTER — Telehealth (HOSPITAL_COMMUNITY): Payer: Self-pay | Admitting: Licensed Clinical Social Worker

## 2015-07-06 DIAGNOSIS — Z8249 Family history of ischemic heart disease and other diseases of the circulatory system: Secondary | ICD-10-CM | POA: Diagnosis not present

## 2015-07-06 DIAGNOSIS — F3164 Bipolar disorder, current episode mixed, severe, with psychotic features: Secondary | ICD-10-CM

## 2015-07-06 DIAGNOSIS — F4310 Post-traumatic stress disorder, unspecified: Secondary | ICD-10-CM

## 2015-07-06 DIAGNOSIS — F323 Major depressive disorder, single episode, severe with psychotic features: Secondary | ICD-10-CM | POA: Diagnosis present

## 2015-07-06 DIAGNOSIS — F4001 Agoraphobia with panic disorder: Secondary | ICD-10-CM

## 2015-07-06 DIAGNOSIS — F603 Borderline personality disorder: Secondary | ICD-10-CM | POA: Diagnosis present

## 2015-07-06 DIAGNOSIS — F319 Bipolar disorder, unspecified: Secondary | ICD-10-CM | POA: Diagnosis not present

## 2015-07-06 DIAGNOSIS — Y929 Unspecified place or not applicable: Secondary | ICD-10-CM

## 2015-07-06 DIAGNOSIS — G4700 Insomnia, unspecified: Secondary | ICD-10-CM | POA: Diagnosis not present

## 2015-07-06 DIAGNOSIS — J45909 Unspecified asthma, uncomplicated: Secondary | ICD-10-CM | POA: Diagnosis present

## 2015-07-06 DIAGNOSIS — M797 Fibromyalgia: Secondary | ICD-10-CM | POA: Diagnosis not present

## 2015-07-06 DIAGNOSIS — X781XXA Intentional self-harm by knife, initial encounter: Secondary | ICD-10-CM | POA: Diagnosis present

## 2015-07-06 DIAGNOSIS — F419 Anxiety disorder, unspecified: Secondary | ICD-10-CM | POA: Diagnosis not present

## 2015-07-06 DIAGNOSIS — Z833 Family history of diabetes mellitus: Secondary | ICD-10-CM

## 2015-07-06 DIAGNOSIS — F315 Bipolar disorder, current episode depressed, severe, with psychotic features: Secondary | ICD-10-CM | POA: Diagnosis present

## 2015-07-06 DIAGNOSIS — Z87891 Personal history of nicotine dependence: Secondary | ICD-10-CM

## 2015-07-06 DIAGNOSIS — F333 Major depressive disorder, recurrent, severe with psychotic symptoms: Secondary | ICD-10-CM | POA: Diagnosis not present

## 2015-07-06 DIAGNOSIS — R45851 Suicidal ideations: Secondary | ICD-10-CM | POA: Diagnosis present

## 2015-07-06 DIAGNOSIS — S61512A Laceration without foreign body of left wrist, initial encounter: Secondary | ICD-10-CM | POA: Diagnosis present

## 2015-07-06 DIAGNOSIS — F42 Obsessive-compulsive disorder: Secondary | ICD-10-CM | POA: Diagnosis present

## 2015-07-06 DIAGNOSIS — F259 Schizoaffective disorder, unspecified: Secondary | ICD-10-CM | POA: Diagnosis not present

## 2015-07-06 DIAGNOSIS — Z62811 Personal history of psychological abuse in childhood: Secondary | ICD-10-CM | POA: Diagnosis not present

## 2015-07-06 DIAGNOSIS — Q6589 Other specified congenital deformities of hip: Secondary | ICD-10-CM

## 2015-07-06 DIAGNOSIS — Z87442 Personal history of urinary calculi: Secondary | ICD-10-CM

## 2015-07-06 MED ORDER — QUETIAPINE FUMARATE 400 MG PO TABS
400.0000 mg | ORAL_TABLET | Freq: Every day | ORAL | Status: DC
  Administered 2015-07-06: 400 mg via ORAL
  Filled 2015-07-06 (×3): qty 1

## 2015-07-06 MED ORDER — LORAZEPAM 0.5 MG PO TABS: 0.5000 mg | ORAL_TABLET | Freq: Four times a day (QID) | ORAL | Status: DC | PRN

## 2015-07-06 MED ORDER — LITHIUM CARBONATE ER 300 MG PO TBCR
600.0000 mg | EXTENDED_RELEASE_TABLET | Freq: Every morning | ORAL | Status: DC
  Administered 2015-07-07 – 2015-07-12 (×6): 600 mg via ORAL
  Filled 2015-07-06: qty 6
  Filled 2015-07-06 (×8): qty 2

## 2015-07-06 MED ORDER — ALUM & MAG HYDROXIDE-SIMETH 200-200-20 MG/5ML PO SUSP
30.0000 mL | ORAL | Status: DC | PRN
  Administered 2015-07-08: 30 mL via ORAL
  Filled 2015-07-06: qty 30

## 2015-07-06 MED ORDER — VENLAFAXINE HCL ER 75 MG PO CP24
75.0000 mg | ORAL_CAPSULE | Freq: Every day | ORAL | Status: DC
  Administered 2015-07-07 – 2015-07-12 (×6): 75 mg via ORAL
  Filled 2015-07-06 (×2): qty 1
  Filled 2015-07-06: qty 3
  Filled 2015-07-06 (×5): qty 1

## 2015-07-06 MED ORDER — MAGNESIUM HYDROXIDE 400 MG/5ML PO SUSP: 30.0000 mL | Freq: Every day | ORAL | Status: DC | PRN

## 2015-07-06 MED ORDER — QUETIAPINE FUMARATE 25 MG PO TABS
25.0000 mg | ORAL_TABLET | Freq: Three times a day (TID) | ORAL | Status: DC | PRN
  Administered 2015-07-06: 25 mg via ORAL
  Filled 2015-07-06: qty 1

## 2015-07-06 MED ORDER — QUETIAPINE FUMARATE 400 MG PO TABS
800.0000 mg | ORAL_TABLET | Freq: Every day | ORAL | Status: DC
  Filled 2015-07-06: qty 2

## 2015-07-06 MED ORDER — TRAZODONE HCL 50 MG PO TABS: 50.0000 mg | ORAL_TABLET | Freq: Every evening | ORAL | Status: DC | PRN

## 2015-07-06 MED ORDER — ACETAMINOPHEN 325 MG PO TABS: 650.0000 mg | ORAL_TABLET | Freq: Four times a day (QID) | ORAL | Status: DC | PRN

## 2015-07-06 MED ORDER — LITHIUM CARBONATE ER 300 MG PO TBCR
300.0000 mg | EXTENDED_RELEASE_TABLET | Freq: Every day | ORAL | Status: DC
  Administered 2015-07-06: 300 mg via ORAL
  Filled 2015-07-06 (×4): qty 1

## 2015-07-06 MED ORDER — IBUPROFEN 600 MG PO TABS
600.0000 mg | ORAL_TABLET | Freq: Four times a day (QID) | ORAL | Status: DC | PRN
  Administered 2015-07-06 – 2015-07-12 (×6): 600 mg via ORAL
  Filled 2015-07-06 (×6): qty 1

## 2015-07-06 NOTE — Progress Notes (Signed)
Date: 07/05/15 Time of encounter: 12:30 PM - 2:00 PM  Purpose: Addressing and identifying healthy boundaries within self. Intervention: Discussed various methods of having healthy boundaries with selves, utilizing self-discipline skills and personal allowances for flaws. Effect: Teresa Bartlett realized the importance of allowing room in her recovery for boundaries with others, while realizing the importance of being her "true self" within her immediate support system.  Teresa Bartlett CPSS
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
## 2015-07-06 NOTE — Progress Notes (Signed)
Expand All Collapse All   7/6 /16  Medication Management Note    Dx  PTSD Panic Disorder Major Depression, Recurrent, with psychotic features, versus Bipolar Disorder II most recently episode depressed  Consider Borderline Personality Disorder Features   Duration - 67mnutes   Subjective -  Patient states she had been doing better but yesterday had episode where she felt someone had broken into her house, and also describes feeling " like fingers on my back ", and a feeling that something " was getting into my skin, my body".    In the context of these increased symptoms she felt acutely depressed and anxious, and cut herself on forearm. She is unsure what caused this exacerbation of symptoms, but states that in group therapy she had started talking more about traumatic  Events in her past and  More deep seated psychological issues. She states she has been taking medications as prescribed and at this time denies medication side effects.  Objective  -  I have met with patient and with Ms. Bowman, PLa Platatherapist. We also had a family meeting with patient and her husband. As noted, yesterday had episode of increased anxiety, increased depression, psychotic symptoms such as feeling that something was touching her and getting into her skin, and episode of significant self cutting on forearm- cuts , scratches visible, none bleeding. Patient had a recent visit to ED  For self injurious ideations, but felt better after a short period and was discharged. She does feel PHP is helping. Husband expresses concern about the gradually escalating symptomatology- increased symptoms, now psychotic symptoms, and increased severity of cutting. Patient denies any medication side effects and states she feels they have been helping.   Current Medications-  LiC03 600 mgrs AM and 300 mgrs HS Pristiq 100 mgrs QDAY Seroquel 800 mgrs QHS       ROS- denies nausea , denies vomiting , denies palpitations, denies  headache   MSE -  At this time alert and attentive, well related, well groomed, good eye contact, speech normal in tone, volume and rate , mood mildly depressed, affect anxious. There is  no thought disorder, denies any suicidal or homicidal ideations at present - as noted did engage in significant self cutting last night,(+) recent psychotic symptoms - as described above- at this time fully resolved, no current hallucinations,no delusions, and not internally preoccupied .   Assessment-  Increased severity of symptoms- increased anxiety, emergence of psychotic symptoms , of short duration, but very scary to patient while occuring, and episode of self cutting with cuts visible in forearm.  Psychosis may be related to micro psychotic symptoms associated with Borderline PD. Husband and patient both concerned about increased symptoms in spite of PHP level of care and current medication management , which patient states have been helping and well tolerated. Of note, patient denies any drug or alcohol abuse .  Plan- patient agrees to voluntary psychiatric admission. Staff to escort her to Assessment area for admission process .   Does not currently need scripts    FNeita Garnet MD
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
## 2015-07-06 NOTE — Progress Notes (Signed)
Adult Psychoeducational Group Note  Date:  07/06/2015 Time:  8:36 PM  Group Topic/Focus:  Wrap-Up Group:   The focus of this group is to help patients review their daily goal of treatment and discuss progress on daily workbooks.  Participation Level:  Active  Participation Quality:  Appropriate  Affect:  Appropriate  Cognitive:  Appropriate  Insight: Good  Engagement in Group:  Engaged  Modes of Intervention:  Discussion  Additional Comments:  Patient stated that she had a crappy day and wants to get better adjusted to her medication and to get on better medications that suit her, she feels as though they are not working for her and that they are being switched too much.. She rated her day a 4.  Natasha MeadKiara M Lunden Stieber 07/06/2015, 8:36 PM
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
## 2015-07-06 NOTE — Telephone Encounter (Signed)
Met with Dr. Parke Poisson to discuss patient's status with husband's call today stating patient had been cutting herself and concerns for her safety.  Patient's husband was informed to take patient to ED for an evaluation but was then brought into outpatient where Dr. Parke Poisson will assess.  Dr. Parke Poisson ordered a Lithium level and order placed and printed out for Dr. Parke Poisson to give to patient to have completed.

## 2015-07-07 ENCOUNTER — Other Ambulatory Visit (HOSPITAL_COMMUNITY): Payer: Self-pay | Admitting: Licensed Clinical Social Worker

## 2015-07-07 DIAGNOSIS — R45851 Suicidal ideations: Secondary | ICD-10-CM

## 2015-07-07 DIAGNOSIS — F333 Major depressive disorder, recurrent, severe with psychotic symptoms: Secondary | ICD-10-CM

## 2015-07-07 LAB — BASIC METABOLIC PANEL
Anion gap: 5 (ref 5–15)
BUN: 15 mg/dL (ref 6–20)
CO2: 29 mmol/L (ref 22–32)
Calcium: 8.9 mg/dL (ref 8.9–10.3)
Chloride: 104 mmol/L (ref 101–111)
Creatinine, Ser: 0.7 mg/dL (ref 0.44–1.00)
GFR calc Af Amer: >60 mL/min (ref >60)
Glucose, Bld: 88 mg/dL (ref 65–99)
POTASSIUM: 4.1 mmol/L (ref 3.5–5.1)
SODIUM: 138 mmol/L (ref 135–145)

## 2015-07-07 LAB — CBC WITH DIFFERENTIAL/PLATELET
BASOS ABS: 0 10*3/uL (ref 0.0–0.1)
BASOS PCT: 0 % (ref 0–1)
EOS PCT: 9 % — AB (ref 0–5)
Eosinophils Absolute: 0.6 10*3/uL (ref 0.0–0.7)
HCT: 35.7 % — ABNORMAL LOW (ref 36.0–46.0)
Hemoglobin: 11.9 g/dL — ABNORMAL LOW (ref 12.0–15.0)
Lymphocytes Relative: 38 % (ref 12–46)
Lymphs Abs: 2.7 10*3/uL (ref 0.7–4.0)
MCH: 30.3 pg (ref 26.0–34.0)
MCHC: 33.3 g/dL (ref 30.0–36.0)
MCV: 90.8 fL (ref 78.0–100.0)
Monocytes Absolute: 0.5 10*3/uL (ref 0.1–1.0)
Monocytes Relative: 8 % (ref 3–12)
Neutro Abs: 3.2 10*3/uL (ref 1.7–7.7)
Neutrophils Relative %: 45 % (ref 43–77)
Platelets: 277 10*3/uL (ref 150–400)
RBC: 3.93 MIL/uL (ref 3.87–5.11)
RDW: 13.5 % (ref 11.5–15.5)
WBC: 7.1 10*3/uL (ref 4.0–10.5)

## 2015-07-07 LAB — TSH: TSH: 4.063 u[IU]/mL (ref 0.350–4.500)

## 2015-07-07 LAB — RAPID URINE DRUG SCREEN, HOSP PERFORMED
Amphetamines: NOT DETECTED
Barbiturates: NOT DETECTED
Benzodiazepines: POSITIVE — AB
Cocaine: NOT DETECTED
Opiates: NOT DETECTED
TETRAHYDROCANNABINOL: NOT DETECTED

## 2015-07-07 LAB — LITHIUM LEVEL: Lithium Lvl: 0.32 mmol/L — ABNORMAL LOW (ref 0.60–1.20)

## 2015-07-07 MED ORDER — IPRATROPIUM-ALBUTEROL 0.5-2.5 (3) MG/3ML IN SOLN
3.0000 mL | Freq: Four times a day (QID) | RESPIRATORY_TRACT | Status: DC | PRN
  Administered 2015-07-07 – 2015-07-10 (×4): 3 mL via RESPIRATORY_TRACT
  Filled 2015-07-07 (×3): qty 3

## 2015-07-07 MED ORDER — ALBUTEROL SULFATE HFA 108 (90 BASE) MCG/ACT IN AERS
2.0000 | INHALATION_SPRAY | Freq: Four times a day (QID) | RESPIRATORY_TRACT | Status: DC | PRN
  Administered 2015-07-07 – 2015-07-10 (×2): 2 via RESPIRATORY_TRACT
  Filled 2015-07-07: qty 6.7

## 2015-07-07 MED ORDER — ONDANSETRON HCL 4 MG PO TABS
4.0000 mg | ORAL_TABLET | Freq: Three times a day (TID) | ORAL | Status: DC | PRN
  Administered 2015-07-07: 4 mg via ORAL
  Filled 2015-07-07: qty 1

## 2015-07-07 MED ORDER — LORAZEPAM 1 MG PO TABS
1.0000 mg | ORAL_TABLET | Freq: Four times a day (QID) | ORAL | Status: DC | PRN
  Administered 2015-07-07 – 2015-07-11 (×8): 1 mg via ORAL
  Filled 2015-07-07 (×8): qty 1

## 2015-07-07 MED ORDER — BENZTROPINE MESYLATE 1 MG PO TABS
1.0000 mg | ORAL_TABLET | Freq: Two times a day (BID) | ORAL | Status: DC
  Administered 2015-07-07 – 2015-07-12 (×11): 1 mg via ORAL
  Filled 2015-07-07 (×6): qty 1
  Filled 2015-07-07: qty 6
  Filled 2015-07-07 (×7): qty 1
  Filled 2015-07-07: qty 6

## 2015-07-07 MED ORDER — LITHIUM CARBONATE ER 300 MG PO TBCR
600.0000 mg | EXTENDED_RELEASE_TABLET | Freq: Every day | ORAL | Status: DC
  Administered 2015-07-07 (×2): 300 mg via ORAL
  Administered 2015-07-08 – 2015-07-10 (×3): 600 mg via ORAL
  Filled 2015-07-07 (×6): qty 2

## 2015-07-07 MED ORDER — HALOPERIDOL 2 MG PO TABS
2.0000 mg | ORAL_TABLET | Freq: Three times a day (TID) | ORAL | Status: DC
  Administered 2015-07-07 – 2015-07-10 (×10): 2 mg via ORAL
  Filled 2015-07-07 (×15): qty 1

## 2015-07-07 MED ORDER — QUETIAPINE FUMARATE 400 MG PO TABS
800.0000 mg | ORAL_TABLET | Freq: Every day | ORAL | Status: DC
  Administered 2015-07-07 – 2015-07-10 (×4): 800 mg via ORAL
  Filled 2015-07-07 (×6): qty 2

## 2015-07-07 NOTE — [facility] Group Notes (Signed)
[facility] Group Notes:  (Counselor/Nursing/MHT/Case Management/Adjunct)  07/07/2015 1:15PM  Type of Therapy:  Group Therapy  Participation Level:  Active  Participation Quality:  Appropriate  Affect:  Flat  Cognitive:  Oriented  Insight:  Improving  Engagement in Group:  Limited  Engagement in Therapy:  Limited  Modes of Intervention:  Discussion, Exploration and Socialization  Summary of Progress/Problems: The topic for group was balance in life.  Pt participated in the discussion about when their life was in balance and out of balance and how this feels.  Pt discussed ways to get back in balance and short term goals they can work on to get where they want to be. 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 She was prompted because she had attended meetings for her son who is diagnosed with Autism, and found out about a need.  Mood was good, and clearly enjoyed telling us about this.  We did a mindfullness exercise, and when it was completed she was staring.  Did not respond to my questions about what was going on or if she needed help.  Eventually got up and left and did not return.  After group, I went to check.  She was sitting on the floor facing the wall, clutching her pillow.  Did not respond to my inquiries.  Nurse confirmed she had already checked with her and was asking Dr for PRN's.   Daryel Geraldorth, Teresa Bartlett 07/07/2015 3:05 PM

## 2015-07-07 NOTE — Plan of Care (Signed)
Problem: Diagnosis: Increased Risk For Suicide Attempt Goal: STG-Patient Will Comply With Medication Regime Outcome: Progressing Patient compliant with medication regimen at this time.

## 2015-07-07 NOTE — Progress Notes (Signed)
D. Pt had been up and visible in milieu this evening, did attend and participate in group activity. Pt spoke about on-going psychosis and feels she is getting worse instead of better and spoke about how she has been on multiple medications with little success. Pt spoke about feeling tired this evening, received medications without incident and retired to her room for the rest of the evening. A. Support and encouragement provided. R. Safety maintained, will continue to monitor.

## 2015-07-07 NOTE — Progress Notes (Signed)
[facility] MD Progress Note  07/07/2015 10:09 PM Teresa Bartlett  MRN:  [id number] Subjective:  Did not sleep on the lowe dose of Seroquel. Feels overwhelmed with the voices and the presence that follows her everywhere. States it was just at her house but now followed her all the way here.states she has been on Seroquel, Invega, Abilify, Saphris and none have help with the voices. Seroquel at 800 mg helps with sleep Principal Problem: Severe recurrent major depressive disorder with psychotic features Diagnosis:   Patient Active Problem List   Diagnosis Date Noted  . Bipolar 1 disorder [F31.9] 07/06/2015  . Severe recurrent major depressive disorder with psychotic features [F33.3]   . Schizoaffective disorder [F25.9] 06/28/2015  . Suicidal ideation [R45.851]   . Kidney stone [N20.0] 04/25/2015  . Fibromyalgia [M79.7] 04/25/2015  . Left wrist pain [M25.532] 11/09/2014  . Fatigue [R53.83] 10/05/2014  . Polyarthralgia [M25.50] 10/05/2014  . Myalgia and myositis [M79.1, M60.9] 10/05/2014  . Vestibular migraine [G43.109] 08/24/2014  . Contact dermatitis [L25.9] 05/10/2014  . Palpitations [R00.2] 03/18/2014  . Benign paroxysmal positional vertigo [H81.10] 03/18/2014  . Right foot pain [M79.671] 11/16/2013  . Insomnia [G47.00] 10/26/2013  . Anxiety and depression [F41.8] 09/15/2013  . Routine general medical examination at a health care facility [Z00.00] 09/15/2013  . Left ankle injury [S99.912A] 07/29/2013  . Right shoulder pain [M25.511] 07/29/2013  . 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 Normal pregnancy [Z34.90] 06/15/2012   Total Time spent with patient: 30 minutes   Past Medical History:  Past Medical History  Diagnosis Date  . Hip dysplasia   . Sacroiliac joint dysfunction of both sides   . IBS (irritable bowel syndrome)   . Normal pregnancy 06/15/2012  . Depression   . Anxiety   . Asthma     "very mild"  . Complication of anesthesia     convulsion with epidural  redose  . IDA (iron deficiency anemia)   . Cardiac dysrhythmia, unspecified   . Migraine, unspecified, without mention of intractable migraine without mention of status migrainosus   . Kidney stone   . Fibromyalgia January, 2016    Past Surgical History  Procedure Laterality Date  . Laparoscopic tubal ligation  09/16/2012    Procedure: LAPAROSCOPIC TUBAL LIGATION;  Surgeon: Logan Bores, MD;  Location: [facility];  Service: Gynecology;  Laterality: Bilateral;   Family History:  Family History  Problem Relation Age of Onset  . Diabetes Mother   . Arthritis Mother   . Kidney disease Mother   . Mental illness Mother   . Fibromyalgia Mother   . Heart disease Father   . Diabetes Father   . Hypertension Father   . Hyperlipidemia Father   . Mental illness Father     ptsd and bipolar  . Heart attack Father   . Depression Father   . Heart disease Paternal Uncle   . Diabetes Maternal Grandmother   . Arthritis Maternal Grandmother   . Cancer Paternal Grandmother     colon  . Hyperlipidemia Paternal Grandmother   . Hypertension Paternal Grandmother   . Diabetes Paternal Grandfather   . Hyperlipidemia Paternal Grandfather   . Hypertension Paternal Grandfather   . Mental illness Paternal Grandfather   . Heart disease Paternal Grandfather   . Other Neg Hx   . Migraines Mother   . Osteoporosis Mother    Social History:  History  Alcohol Use  . Yes    Comment: occasional      History  Drug Use No    History   Social History  . Marital Status: Married    Spouse Name: N/A  . Number of Children: N/A  . Years of Education: N/A   Social History Main Topics  . Smoking status: Former Smoker -- 2 years    Types: Cigarettes    Quit date: 06/05/2008  . Smokeless tobacco: Never Used  . Alcohol Use: Yes     Comment: occasional   . Drug Use: No  . Sexual Activity: Yes    Birth Control/ Protection: Surgical   Other Topics Concern  . Not on file   Social History Narrative    She is a stay at home mother.   She lives with husband and two sons.   Highest level of education:  Two years of college   Additional History:    Sleep: Poor  Appetite:  Poor   Assessment:   Musculoskeletal: Strength & Muscle Tone: within normal limits Gait & Station: normal Patient leans: normal   Psychiatric Specialty Exam: Physical Exam  Review of Systems  Constitutional: Negative.   HENT: Negative.   Eyes: Negative.   Respiratory: Negative.   Cardiovascular: Negative.   Gastrointestinal: Negative.   Genitourinary: Negative.   Musculoskeletal: Negative.   Skin: Negative.   Neurological: Negative.   Endo/Heme/Allergies: Negative.   Psychiatric/Behavioral: Positive for depression and hallucinations.    Blood pressure 97/66, pulse 93, temperature 98 F (36.7 C), temperature source Oral, resp. rate 16, height $RemoveBe'5\' 7"'oBdefKnfY$  (1.702 m), weight 109.77 kg (242 lb), last menstrual period 06/14/2015, SpO2 100 %.Body mass index is 37.89 kg/(m^2).  General Appearance: Fairly Groomed  Engineer, water::  Fair  Speech:  Clear and Coherent  Volume:  fluctuates  Mood:  Anxious, Depressed, Dysphoric and Hopeless  Affect:  Labile and Tearful  Thought Process:  Coherent and Goal Directed  Orientation:  Full (Time, Place, and Person)  Thought Content:  symptoms events worries concerns  Suicidal Thoughts:  Yes.  without intent/plan  Homicidal Thoughts:  No  Memory:  Immediate;   Fair Recent;   Fair Remote;   Fair  Judgement:  Fair  Insight:  Present  Psychomotor Activity:  Restlessness and sits in the corner of the room holding her head  Concentration:  Fair  Recall:  [employer] of Knowledge:Fair  Language: Fair  Akathisia:  No  Handed:  Right  AIMS (if indicated):     Assets:  Desire for Improvement  ADL's:  Intact  Cognition: WNL  Sleep:  Number of Hours: 6.75     Current Medications: Current Facility-Administered Medications  Medication Dose Route Frequency Provider Last  Rate Last Dose  . acetaminophen (TYLENOL) tablet 650 mg  650 mg Oral Q6H PRN Kerrie Buffalo, NP      . albuterol (PROVENTIL HFA;VENTOLIN HFA) 108 (90 BASE) MCG/ACT inhaler 2 puff  2 puff Inhalation Q6H PRN Laverle Hobby, PA-C   2 puff at 07/07/15 0840  . alum & mag hydroxide-simeth (MAALOX/MYLANTA) 200-200-20 MG/5ML suspension 30 mL  30 mL Oral Q4H PRN Kerrie Buffalo, NP      . benztropine (COGENTIN) tablet 1 mg  1 mg Oral BID Nicholaus Bloom, MD   1 mg at 07/07/15 1939  . haloperidol (HALDOL) tablet 2 mg  2 mg Oral TID Nicholaus Bloom, MD   2 mg at 07/07/15 1939  . ibuprofen (ADVIL,MOTRIN) tablet 600 mg  600 mg Oral Q6H PRN Kerrie Buffalo, NP   600 mg at 07/06/15 1614  .  ipratropium-albuterol (DUONEB) 0.5-2.5 (3) MG/3ML nebulizer solution 3 mL  3 mL Nebulization Q6H PRN Kerrie Buffalo, NP   3 mL at 07/07/15 1939  . lithium carbonate (LITHOBID) CR tablet 600 mg  600 mg Oral q morning - 10a Jenne Campus, MD   600 mg at 07/07/15 0954  . lithium carbonate (LITHOBID) CR tablet 600 mg  600 mg Oral QHS Nicholaus Bloom, MD   300 mg at 07/07/15 2101  . LORazepam (ATIVAN) tablet 1 mg  1 mg Oral Q6H PRN Nicholaus Bloom, MD   1 mg at 07/07/15 1528  . magnesium hydroxide (MILK OF MAGNESIA) suspension 30 mL  30 mL Oral Daily PRN Kerrie Buffalo, NP      . ondansetron [facility]) tablet 4 mg  4 mg Oral Q8H PRN Shuvon B Rankin, NP   4 mg at 07/07/15 1528  . QUEtiapine (SEROQUEL) tablet 800 mg  800 mg Oral QHS Nicholaus Bloom, MD   800 mg at 07/07/15 2059  . venlafaxine XR (EFFEXOR-XR) 24 hr capsule 75 mg  75 mg Oral Q breakfast Jenne Campus, MD   75 mg at 07/07/15 1308    Lab Results:  Results for orders placed or performed during the hospital encounter of 07/06/15 (from the past 48 hour(s))  Lithium level     Status: Abnormal   Collection Time: 07/07/15  6:09 AM  Result Value Ref Range   Lithium Lvl 0.32 (L) 0.60 - 1.20 mmol/L    Comment: Performed at [facility] metabolic panel      Status: None   Collection Time: 07/07/15  6:09 AM  Result Value Ref Range   Sodium 138 135 - 145 mmol/L   Potassium 4.1 3.5 - 5.1 mmol/L   Chloride 104 101 - 111 mmol/L   CO2 29 22 - 32 mmol/L   Glucose, Bld 88 65 - 99 mg/dL   BUN 15 6 - 20 mg/dL   Creatinine, Ser 0.70 0.44 - 1.00 mg/dL   Calcium 8.9 8.9 - 10.3 mg/dL   GFR calc non Af Amer >60 >60 mL/min   GFR calc Af Amer >60 >60 mL/min    Comment: (NOTE) The eGFR has been calculated using the CKD EPI equation. This calculation has not been validated in all clinical situations. eGFR's persistently <60 mL/min signify possible Chronic Kidney Disease.    Anion gap 5 5 - 15    Comment: Performed at [facility]  CBC with Differential/Platelet     Status: Abnormal   Collection Time: 07/07/15  6:09 AM  Result Value Ref Range   WBC 7.1 4.0 - 10.5 K/uL   RBC 3.93 3.87 - 5.11 MIL/uL   Hemoglobin 11.9 (L) 12.0 - 15.0 g/dL   HCT 35.7 (L) 36.0 - 46.0 %   MCV 90.8 78.0 - 100.0 fL   MCH 30.3 26.0 - 34.0 pg   MCHC 33.3 30.0 - 36.0 g/dL   RDW 13.5 11.5 - 15.5 %   Platelets 277 150 - 400 K/uL   Neutrophils Relative % 45 43 - 77 %   Neutro Abs 3.2 1.7 - 7.7 K/uL   Lymphocytes Relative 38 12 - 46 %   Lymphs Abs 2.7 0.7 - 4.0 K/uL   Monocytes Relative 8 3 - 12 %   Monocytes Absolute 0.5 0.1 - 1.0 K/uL   Eosinophils Relative 9 (H) 0 - 5 %   Eosinophils Absolute 0.6 0.0 - 0.7 K/uL   Basophils  Relative 0 0 - 1 %   Basophils Absolute 0.0 0.0 - 0.1 K/uL    Comment: Performed at [facility]  TSH     Status: None   Collection Time: 07/07/15  6:09 AM  Result Value Ref Range   TSH 4.063 0.350 - 4.500 uIU/mL    Comment: Performed at [facility]  Urine rapid drug screen (hosp performed)     Status: Abnormal   Collection Time: 07/07/15  7:01 AM  Result Value Ref Range   Opiates NONE DETECTED NONE DETECTED   Cocaine NONE DETECTED NONE DETECTED   Benzodiazepines POSITIVE (A) NONE DETECTED    Amphetamines NONE DETECTED NONE DETECTED   Tetrahydrocannabinol NONE DETECTED NONE DETECTED   Barbiturates NONE DETECTED NONE DETECTED    Comment:        DRUG SCREEN FOR MEDICAL PURPOSES ONLY.  IF CONFIRMATION IS NEEDED FOR ANY PURPOSE, NOTIFY LAB WITHIN 5 DAYS.        LOWEST DETECTABLE LIMITS FOR URINE DRUG SCREEN Drug Class       Cutoff (ng/mL) Amphetamine      1000 Barbiturate      200 Benzodiazepine   657 Tricyclics       846 Opiates          300 Cocaine          300 THC              50 Performed at [facility]     Physical Findings: AIMS:  , ,  ,  ,    CIWA:    COWS:     Treatment Plan Summary: Daily contact with patient to assess and evaluate symptoms and progress in treatment and Medication management Supportive approach/coping skills Hallucinations; will start a trial with Haldol augmenting the Seroquel Insomnia; will resume the higher dose of Seroquel (800 mg) Will use Ativan 1 mg PRN to help the acute anxiety Will work to improve reality testing, instil hope  Medical Decision Making:  Review of Psycho-Social Stressors (1), Review or order clinical lab tests (1), Review of Medication Regimen & Side Effects (2) and Review of New Medication or Change in Dosage (2)     Jerline Linzy A 07/07/2015, 10:09 PM

## 2015-07-07 NOTE — Progress Notes (Signed)
Patient ID: Teresa Bartlett, female   DOB: 1990-04-27, 25 y.o.   MRN: [id number]  DAR: Pt. Denies HI and Visual Hallucinations during morning assessment however endorses SI that is constant. Patient reports she also is experiencing auditory hallucinations. Patient states, " I have two voices that are nice and one that is not so nice." Patient reported feeling a "tightness" in her chest this morning related to shortness of breath she was experiencing. Patient received inhaler and reported it's effectiveness. However, a few hours later reported she was feeling short of breath again. Patient was given a nebulizing treatment and was effective. Patient reports she has not slept in, "two days." Patient seems somewhat somatically focused. Patient came to Clinical research associatewriter during afternoon social work group and stated that she was hallucinating and needed something to help her calm down. Writer spoke to patient and MD AfghanistanLugo came to speak with patient. Patient received PRN Ativan and scheduled medication to help with her hallucinations. Patient also received PRN Zofran for reported nausea. When writer administered these medications patient pointed out that she has scratched at her skin on her thighs. Writer assessed and it appears pink and irritated. Patient stated, "I will not do it again." Patient stated she was going to take a nap or relax in the dayroom. Support and encouragement provided to the patient. Scheduled medications administered to patient per physician's orders. Patient is seen in the milieu at times and interacts appropriately with staff and peers. Q15 minute checks are maintained for safety.
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
## 2015-07-07 NOTE — Progress Notes (Signed)
D   Pt is cooperative and pleasant   She did not attend karaoke group this evening    She reports ongoing voices but her verbalizations are logical and her speech is coherent    She reports poor sleep and reports still hearing voices but does not appear to be responding to internal stimuli   She interacts with select peers   She denied suicidal ideation but reports it could change anytime A   Verbal support given   Medications administered and effectiveness monitored   Q 15 min checks R   Pt safe at present

## 2015-07-08 ENCOUNTER — Other Ambulatory Visit (HOSPITAL_COMMUNITY): Payer: Self-pay

## 2015-07-08 NOTE — [facility] Suicide Risk Assessment (Signed)
[facility] INPATIENT:  Family/Significant Other Suicide Prevention Education  Suicide Prevention Education:  Education Completed; Aline AugustZachary Captain [phone]), husband,  (name of family member/significant other) has been identified by the patient as the family member/significant other with whom the patient will be residing, and identified as the person(s) who will aid the patient in the event of a mental health crisis (suicidal ideations/suicide attempt).  With written consent from the patient, the family member/significant other has been provided the following suicide prevention education, prior to the and/or following the discharge of the patient.  The suicide prevention education provided includes the following:  Suicide risk factors  Suicide prevention and interventions  National Suicide Hotline telephone number  [facility] assessment telephone number  [facility] Emergency Assistance 911  [facility] and/or Residential Mobile Crisis Unit telephone number  Request made of family/significant other to:  Remove weapons (e.g., guns, rifles, knives), all items previously/currently identified as safety concern.    Remove drugs/medications (over-the-counter, prescriptions, illicit drugs), all items previously/currently identified as a safety concern.  The family member/significant other verbalizes understanding of the suicide prevention education information provided.  The family member/significant other agrees to remove the items of safety concern listed above.  Husband states there are no firearms in the home; however, there are medications prescribed for patient and husband.  CSW advised husband to secure medications if possible, discuss options w MD or care team, consider using pill box to limit amount of medications available at any one time, securing larger amount of medications under lock.  Husband agreeable, no further questions/concerns.  Santa GeneraAnne Cunningham,  LCSW Clinical Social Worker   Sallee LangeCunningham, Anne C 07/08/2015, 11:59 AM
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
## 2015-07-08 NOTE — Progress Notes (Signed)
[facility] Group Notes:  (Nursing/MHT/Case Management/Adjunct)  Date:  07/08/2015  Time:  8:52 PM  Type of Therapy:  Psychoeducational Skills  Participation Level:  Active  Participation Quality:  Appropriate  Affect:  Appropriate  Cognitive:  Appropriate  Insight:  Appropriate  Engagement in Group:  Developing/Improving  Modes of Intervention:  Education  Summary of Progress/Problems: Patient shared with the group that she slept for nearly the entire day and that she felt that her medication was beginning to take effect. The patient explained that she "likes" her medication since she feels more "leveled out" and that the voices have decreased. In terms of the theme for the day, her support system will be made up of her husband and sister.   Teresa CocaGOODMAN, Teresa Bartlett 07/08/2015, 8:52 PM

## 2015-07-08 NOTE — [facility] Group Notes (Signed)
[facility] LCSW Group Therapy  07/08/2015 1:30 PM  Type of Therapy:  Group Therapy  Participation Level:  Invited, chose not to attend.  Summary of Progress/Problems:  Chaplain led group explored concept of hope and its relevance to mental health recovery.  Patients explored themes including what matters to them personally, how others responses are similar/different, and what they are hopeful for.  Group members discussed relevance of social supports, innter strength and using their own stories to craft a recovery path.    Sallee Langeunningham, Anne C 5:25 PM

## 2015-07-08 NOTE — Progress Notes (Signed)
[facility] MD Progress Note  07/08/2015 8:22 PM Teresa Bartlett  MRN:  [id number] Subjective:  Teresa Bartlett states that she was able to sleep with the full dose of the Seroquel and states that the voices might be more distant and the visual image seems to be fading. States she is hopeful that the haldol in combination with the Seroquel could continue to help her Principal Problem: Severe recurrent major depressive disorder with psychotic features Diagnosis:   Patient Active Problem List   Diagnosis Date Noted  . Bipolar 1 disorder [F31.9] 07/06/2015  . Severe recurrent major depressive disorder with psychotic features [F33.3]   . Schizoaffective disorder [F25.9] 06/28/2015  . Suicidal ideation [R45.851]   . Kidney stone [N20.0] 04/25/2015  . Fibromyalgia [M79.7] 04/25/2015  . Left wrist pain [M25.532] 11/09/2014  . Fatigue [R53.83] 10/05/2014  . Polyarthralgia [M25.50] 10/05/2014  . Myalgia and myositis [M79.1, M60.9] 10/05/2014  . Vestibular migraine [G43.109] 08/24/2014  . Contact dermatitis [L25.9] 05/10/2014  . Palpitations [R00.2] 03/18/2014  . Benign paroxysmal positional vertigo [H81.10] 03/18/2014  . Right foot pain [M79.671] 11/16/2013  . Insomnia [G47.00] 10/26/2013  . Anxiety and depression [F41.8] 09/15/2013  . Routine general medical examination at a health care facility [Z00.00] 09/15/2013  . Left ankle injury [S99.912A] 07/29/2013  . Right shoulder pain [M25.511] 07/29/2013  . 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 Normal pregnancy [Z34.90] 06/15/2012   Total Time spent with patient: 30 minutes   Past Medical History:  Past Medical History  Diagnosis Date  . Hip dysplasia   . Sacroiliac joint dysfunction of both sides   . IBS (irritable bowel syndrome)   . Normal pregnancy 06/15/2012  . Depression   . Anxiety   . Asthma     "very mild"  . Complication of anesthesia     convulsion with epidural redose  . IDA (iron deficiency anemia)   . Cardiac dysrhythmia,  unspecified   . Migraine, unspecified, without mention of intractable migraine without mention of status migrainosus   . Kidney stone   . Fibromyalgia January, 2016    Past Surgical History  Procedure Laterality Date  . Laparoscopic tubal ligation  09/16/2012    Procedure: LAPAROSCOPIC TUBAL LIGATION;  Surgeon: [name], MD;  Location: [facility];  Service: Gynecology;  Laterality: Bilateral;   Family History:  Family History  Problem Relation Age of Onset  . Diabetes Mother   . Arthritis Mother   . Kidney disease Mother   . Mental illness Mother   . Fibromyalgia Mother   . Heart disease Father   . Diabetes Father   . Hypertension Father   . Hyperlipidemia Father   . Mental illness Father     ptsd and bipolar  . Heart attack Father   . Depression Father   . Heart disease Paternal Uncle   . Diabetes Maternal Grandmother   . Arthritis Maternal Grandmother   . Cancer Paternal Grandmother     colon  . Hyperlipidemia Paternal Grandmother   . Hypertension Paternal Grandmother   . Diabetes Paternal Grandfather   . Hyperlipidemia Paternal Grandfather   . Hypertension Paternal Grandfather   . Mental illness Paternal Grandfather   . Heart disease Paternal Grandfather   . Other Neg Hx   . Migraines Mother   . Osteoporosis Mother    Social History:  History  Alcohol Use  . Yes    Comment: occasional      History  Drug Use No    History  Social History  . Marital Status: Married    Spouse Name: N/A  . Number of Children: N/A  . Years of Education: N/A   Social History Main Topics  . Smoking status: Former Smoker -- 2 years    Types: Cigarettes    Quit date: 06/05/2008  . Smokeless tobacco: Never Used  . Alcohol Use: Yes     Comment: occasional   . Drug Use: No  . Sexual Activity: Yes    Birth Control/ Protection: Surgical   Other Topics Concern  . Not on file   Social History Narrative   She is a stay at home mother.   She lives with husband and two  sons.   Highest level of education:  Two years of college   Additional History:    Sleep: Fair  Appetite:  Fair   Assessment:   Musculoskeletal: Strength & Muscle Tone: within normal limits Gait & Station: normal Patient leans: normal   Psychiatric Specialty Exam: Physical Exam  Review of Systems  Constitutional: Positive for malaise/fatigue.  HENT: Negative.   Eyes: Negative.   Respiratory: Negative.   Cardiovascular: Negative.   Gastrointestinal: Negative.   Genitourinary: Negative.   Musculoskeletal: Negative.   Skin: Negative.   Neurological: Negative.   Endo/Heme/Allergies: Negative.   Psychiatric/Behavioral: Positive for hallucinations. The patient is nervous/anxious.     Blood pressure 95/56, pulse 108, temperature 97.9 F (36.6 C), temperature source Oral, resp. rate 18, height $RemoveBe'5\' 7"'oOZRexQdm$  (1.702 m), weight 109.77 kg (242 lb), last menstrual period 06/14/2015, SpO2 100 %.Body mass index is 37.89 kg/(m^2).  General Appearance: Fairly Groomed  Engineer, water::  Fair  Speech:  Clear and Coherent  Volume:  Normal  Mood:  Anxious and worried  Affect:  appropriate  Thought Process:  Coherent and Goal Directed  Orientation:  Full (Time, Place, and Person)  Thought Content:  symptoms events worries concerns  Suicidal Thoughts:  No  Homicidal Thoughts:  No  Memory:  Immediate;   Fair Recent;   Fair Remote;   Fair  Judgement:  Fair  Insight:  Present  Psychomotor Activity:  Normal  Concentration:  Fair  Recall:  [employer]  Language: Fair  Akathisia:  No  Handed:  Right  AIMS (if indicated):     Assets:  Desire for Improvement Housing Social Support  ADL's:  Intact  Cognition: WNL  Sleep:  Number of Hours: 6.75     Current Medications: Current Facility-Administered Medications  Medication Dose Route Frequency Provider Last Rate Last Dose  . acetaminophen (TYLENOL) tablet 650 mg  650 mg Oral Q6H PRN Kerrie Buffalo, NP      . albuterol  (PROVENTIL HFA;VENTOLIN HFA) 108 (90 BASE) MCG/ACT inhaler 2 puff  2 puff Inhalation Q6H PRN Laverle Hobby, PA-C   2 puff at 07/07/15 0840  . alum & mag hydroxide-simeth (MAALOX/MYLANTA) 200-200-20 MG/5ML suspension 30 mL  30 mL Oral Q4H PRN Kerrie Buffalo, NP   30 mL at 07/08/15 1635  . benztropine (COGENTIN) tablet 1 mg  1 mg Oral BID Nicholaus Bloom, MD   1 mg at 07/08/15 1645  . haloperidol (HALDOL) tablet 2 mg  2 mg Oral TID Nicholaus Bloom, MD   2 mg at 07/08/15 1645  . ibuprofen (ADVIL,MOTRIN) tablet 600 mg  600 mg Oral Q6H PRN Kerrie Buffalo, NP   600 mg at 07/06/15 1614  . ipratropium-albuterol (DUONEB) 0.5-2.5 (3) MG/3ML nebulizer solution 3 mL  3 mL Nebulization Q6H PRN  Kerrie Buffalo, NP   3 mL at 07/08/15 1809  . lithium carbonate (LITHOBID) CR tablet 600 mg  600 mg Oral q morning - 10a Jenne Campus, MD   600 mg at 07/08/15 0836  . lithium carbonate (LITHOBID) CR tablet 600 mg  600 mg Oral QHS Nicholaus Bloom, MD   300 mg at 07/07/15 2101  . LORazepam (ATIVAN) tablet 1 mg  1 mg Oral Q6H PRN Nicholaus Bloom, MD   1 mg at 07/08/15 1922  . magnesium hydroxide (MILK OF MAGNESIA) suspension 30 mL  30 mL Oral Daily PRN Kerrie Buffalo, NP      . ondansetron [facility]) tablet 4 mg  4 mg Oral Q8H PRN Shuvon B Rankin, NP   4 mg at 07/07/15 1528  . QUEtiapine (SEROQUEL) tablet 800 mg  800 mg Oral QHS Nicholaus Bloom, MD   800 mg at 07/07/15 2059  . venlafaxine XR (EFFEXOR-XR) 24 hr capsule 75 mg  75 mg Oral Q breakfast Jenne Campus, MD   75 mg at 07/08/15 9476    Lab Results:  Results for orders placed or performed during the hospital encounter of 07/06/15 (from the past 48 hour(s))  Lithium level     Status: Abnormal   Collection Time: 07/07/15  6:09 AM  Result Value Ref Range   Lithium Lvl 0.32 (L) 0.60 - 1.20 mmol/L    Comment: Performed at [facility] metabolic panel     Status: None   Collection Time: 07/07/15  6:09 AM  Result Value Ref Range   Sodium 138 135 -  145 mmol/L   Potassium 4.1 3.5 - 5.1 mmol/L   Chloride 104 101 - 111 mmol/L   CO2 29 22 - 32 mmol/L   Glucose, Bld 88 65 - 99 mg/dL   BUN 15 6 - 20 mg/dL   Creatinine, Ser 0.70 0.44 - 1.00 mg/dL   Calcium 8.9 8.9 - 10.3 mg/dL   GFR calc non Af Amer >60 >60 mL/min   GFR calc Af Amer >60 >60 mL/min    Comment: (NOTE) The eGFR has been calculated using the CKD EPI equation. This calculation has not been validated in all clinical situations. eGFR's persistently <60 mL/min signify possible Chronic Kidney Disease.    Anion gap 5 5 - 15    Comment: Performed at [facility]  CBC with Differential/Platelet     Status: Abnormal   Collection Time: 07/07/15  6:09 AM  Result Value Ref Range   WBC 7.1 4.0 - 10.5 K/uL   RBC 3.93 3.87 - 5.11 MIL/uL   Hemoglobin 11.9 (L) 12.0 - 15.0 g/dL   HCT 35.7 (L) 36.0 - 46.0 %   MCV 90.8 78.0 - 100.0 fL   MCH 30.3 26.0 - 34.0 pg   MCHC 33.3 30.0 - 36.0 g/dL   RDW 13.5 11.5 - 15.5 %   Platelets 277 150 - 400 K/uL   Neutrophils Relative % 45 43 - 77 %   Neutro Abs 3.2 1.7 - 7.7 K/uL   Lymphocytes Relative 38 12 - 46 %   Lymphs Abs 2.7 0.7 - 4.0 K/uL   Monocytes Relative 8 3 - 12 %   Monocytes Absolute 0.5 0.1 - 1.0 K/uL   Eosinophils Relative 9 (H) 0 - 5 %   Eosinophils Absolute 0.6 0.0 - 0.7 K/uL   Basophils Relative 0 0 - 1 %   Basophils Absolute 0.0 0.0 - 0.1 K/uL  Comment: Performed at [facility]  TSH     Status: None   Collection Time: 07/07/15  6:09 AM  Result Value Ref Range   TSH 4.063 0.350 - 4.500 uIU/mL    Comment: Performed at [facility]  Urine rapid drug screen (hosp performed)     Status: Abnormal   Collection Time: 07/07/15  7:01 AM  Result Value Ref Range   Opiates NONE DETECTED NONE DETECTED   Cocaine NONE DETECTED NONE DETECTED   Benzodiazepines POSITIVE (A) NONE DETECTED   Amphetamines NONE DETECTED NONE DETECTED   Tetrahydrocannabinol NONE DETECTED NONE DETECTED    Barbiturates NONE DETECTED NONE DETECTED    Comment:        DRUG SCREEN FOR MEDICAL PURPOSES ONLY.  IF CONFIRMATION IS NEEDED FOR ANY PURPOSE, NOTIFY LAB WITHIN 5 DAYS.        LOWEST DETECTABLE LIMITS FOR URINE DRUG SCREEN Drug Class       Cutoff (ng/mL) Amphetamine      1000 Barbiturate      200 Benzodiazepine   637 Tricyclics       858 Opiates          300 Cocaine          300 THC              50 Performed at [facility]     Physical Findings: AIMS:  , ,  ,  ,    CIWA:    COWS:     Treatment Plan Summary: Daily contact with patient to assess and evaluate symptoms and progress in treatment and Medication management Supportive approach/coping skills Hallucinations; continue the Haldol at 2 mg TID and reassess Insomnia/mood instability; will continue the Seroquel 800 mg HS Will continue to work with CBT/mindfulness  Medical Decision Making:  Review of Psycho-Social Stressors (1), Review of Medication Regimen & Side Effects (2) and Review of New Medication or Change in Dosage (2)     Grisela Mesch A 07/08/2015, 8:22 PM

## 2015-07-08 NOTE — Progress Notes (Signed)
D   Pt is cooperative and pleasant   She did attend  group this evening    She reports ongoing voices but her verbalizations are logical and her speech is coherent    She reports poor sleep and reports still hearing voices but does not appear to be responding to internal stimuli   She interacts with select peers   She denied suicidal ideation but reports it could change anytime   Pt took dressing off of wound on her arm and it appeared she had picked at it some   She said the tape from the bandage was bothering her  A   Verbal support given   Medications administered and effectiveness monitored   Q 15 min checks   Encouraged good hygiene because of risk for infection due to open sores R   Pt safe at present and verbalized understanding

## 2015-07-08 NOTE — Plan of Care (Signed)
Problem: Alteration in mood Goal: STG-Patient reports thoughts of self-harm to staff Outcome: Progressing Patient reports thoughts of self harm and is able to contract for safety.

## 2015-07-08 NOTE — [facility] Group Notes (Signed)
[facility] L[facility] Discharge Planning Group Note   07/08/2015 10:55 AM  Participation Quality:  Invited.  In bed asleep.    Daryel GeraldNorth, Stepfanie Yott B

## 2015-07-08 NOTE — Progress Notes (Signed)
Patient ID: Teresa HoltsAlexandria Bartlett, female   DOB: 16-Jun-1990, 25 y.o.   MRN: [id number]  DAR: Pt. Denies homicidal ideation. Patient endorses suicidal ideation but reports more so it's the on and off thoughts to self harm. Patient reports that the bandages that writer applied over her self inflicted scratches are helping. Patient reports she slept fair last night, appetite is good, energy is low, and concentration level is good. Patient reports depression 2/10, hopelessness 7/10, and anxiety 5/10 for the day. Patient reports very mild discomfort in right shoulder patient states is due to, ",my rotator cuff."  Support and encouragement provided to the patient. Scheduled medications administered to patient per physician's orders. Patient reports that she feels better than yesterday. Patient is receptive and cooperative. Patient is seen in the milieu interacting with staff and peers appropriately. Q15 minute checks are maintained for safety.

## 2015-07-09 NOTE — [facility] Group Notes (Signed)
[facility] Group Notes:  (Clinical Social Work)  07/09/2015  11:15-12:00PM  Summary of Progress/Problems:   The main focus of today's process group was to discuss patients' feelings related to being hospitalized.  It was agreed in general by the group that it would be preferable to avoid future hospitalizations, and we discussed means of doing that.  As a follow-up, problems with adhering to medication recommendations were discussed.  The patient expressed their primary feeling about being hospitalized is that she is "safe."  She talked about this being her 3rd time in the hospital in 2 months, and how she was previously discharged prior to being completely stable, so she wants to work on "finding out what is bad for my illness."  She states she was previously in the Partial Hospitalization Program, and while it was helpful it did not help with the specific time slot of 4:15-6:00pm, and that is why she ended up back in the facility.  She is seeking help to create a little more complete routine/schedule and was referred to her Peer Support Specialist for that.  She also thinks that perhaps she should go to the Intensive Outpatient Program at discharge.  Type of Therapy:  Group Therapy - Process  Participation Level:  Active  Participation Quality:  Attentive, Sharing and Supportive  Affect:  Anxious and Depressed  Cognitive:  Alert and Appropriate  Insight:  Engaged  Engagement in Therapy:  Engaged  Modes of Intervention:  Exploration, Discussion  Ambrose MantleMareida Grossman-Orr, LCSW 07/09/2015, 12:25 PM

## 2015-07-09 NOTE — Progress Notes (Signed)
Patient ID: Teresa Bartlett, female   DOB: October 31, 1990, 25 y.o.   MRN: [id number]   D: Pt has been very paranoid and delusional on the unit. Pt started the morning reporting that she was very dizzy and that she could not take her medication until she felt better. Dr. Fredda HammedAktar was made aware of the patient being dizzy, he gave instructions to make sure patient remained hydrated he also told patient to be mindful when she moves from one position to another. Pt was give a pitcher of water, and instructed to drink plenty of fluids. Pt on and off has reported random AH/VH, however at time of assessment pt reported being negative SI/HI, no AH/VH noted. Pt reported that her depression was a 3, her hopelessness was a 2, and her anxiety was a 5. Pt was given a dose of Ativan to help with her anxiety, she reported some relief. Pt reported that her goal was to get rest and take it easy. Orders noted for EKG, EKG done, results were given to Dr. Fredda HammedAktar. No new orders noted. A: 15 min checks continued for patient safety. R: Pt safety maintained.

## 2015-07-09 NOTE — Progress Notes (Signed)
D   Pt is cooperative and pleasant   She did attend  group this evening    She reports ongoing voices but her verbalizations are logical and her speech is coherent    She reports poor sleep and reports still hearing voices but does not appear to be responding to internal stimuli   She interacts with select peers   She denied suicidal ideation but reports it could change anytime   Pt took dressing off of wound on her arm and it appeared she had picked at it some   She said the tape from the bandage was bothering her   She provided staff with some letters another pt gave her about getting together with her  And she expressed her concerns about her safety A   Verbal support given   Medications administered and effectiveness monitored   Q 15 min checks   Encouraged good hygiene because of risk for infection due to open sores  Reassured pt of her safety and encouraged her to continue to come to staff with her concerns R   Pt safe at present and verbalized understanding

## 2015-07-09 NOTE — [facility] Group Notes (Signed)
[facility] Group Notes:  (Nursing/MHT/Case Management/Adjunct)  Date:  07/09/2015  Time:  1:33 PM  Type of Therapy:  Psychoeducational Skills  Participation Level:  Active  Participation Quality:  Appropriate  Affect:  Appropriate  Cognitive:  Appropriate  Insight:  Appropriate  Engagement in Group:  Engaged  Modes of Intervention:  Discussion  Summary of Progress/Problems: Pt did attend self inventory group, pt reported that she was negative SI/HI, no AH/VH noted. Pt rated her depression as a 4, and her helplessness/hopelessness as a 2.     Pt reported no issues or concerns. Pt reported that she had no effective coping skills.     Jacquelyne BalintForrest, Regla Fitzgibbon Shanta 07/09/2015, 1:33 PM

## 2015-07-09 NOTE — Progress Notes (Signed)
D:The patient requested that she speak with this author in her room. The patient alleges that one of her female peers handed her a note between 2100 and 2115. Less than fifteen minutes later, she alleges that the exact same peer entered her bedroom and handed her a second note.   A: The two notes were notes were shared with her nurse as well as with the charge nurse.   R: Patient states that she is afraid to go to sleep and that she does not feel safe on the unit. This author notified the patient that she would be checked on a regular basis and that the nurse would be made aware of the situation.

## 2015-07-09 NOTE — Progress Notes (Signed)
Patient ID: Isidor Holts, female   DOB: December 18, 1990, 25 y.o.   MRN: [id number] [facility] MD Progress Note  07/09/2015 12:33 PM Teresa Bartlett  MRN:  [id number] Subjective:  Teresa Bartlett states that she is sleeping better.Less guarded and feels voices are more distant. Tolerating medications.  She is feeling combination of haldol and seroquel is helping. She is not scratching and states adding haldol has helped since it has not been tried and feeling more clear in her toughts.  No involuntary movements.  Had some dizziness in morning . She suffers from Benign positional vertigo. . Vitals were checked.  Principal Problem: Severe recurrent major depressive disorder with psychotic features Diagnosis:   Patient Active Problem List   Diagnosis Date Noted  . Bipolar 1 disorder [F31.9] 07/06/2015  . Severe recurrent major depressive disorder with psychotic features [F33.3]   . Schizoaffective disorder [F25.9] 06/28/2015  . Suicidal ideation [R45.851]   . Kidney stone [N20.0] 04/25/2015  . Fibromyalgia [M79.7] 04/25/2015  . Left wrist pain [M25.532] 11/09/2014  . Fatigue [R53.83] 10/05/2014  . Polyarthralgia [M25.50] 10/05/2014  . Myalgia and myositis [M79.1, M60.9] 10/05/2014  . Vestibular migraine [G43.109] 08/24/2014  . Contact dermatitis [L25.9] 05/10/2014  . Palpitations [R00.2] 03/18/2014  . Benign paroxysmal positional vertigo [H81.10] 03/18/2014  . Right foot pain [M79.671] 11/16/2013  . Insomnia [G47.00] 10/26/2013  . Anxiety and depression [F41.8] 09/15/2013  . Routine general medical examination at a health care facility [Z00.00] 09/15/2013  . Left ankle injury [S99.912A] 07/29/2013  . Right shoulder pain [M25.511] 07/29/2013  . 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 Normal pregnancy [Z34.90] 06/15/2012   Total Time spent with patient: 30 minutes   Past Medical History:  Past Medical History  Diagnosis Date  . Hip dysplasia   . Sacroiliac joint dysfunction of both sides    . IBS (irritable bowel syndrome)   . Normal pregnancy 06/15/2012  . Depression   . Anxiety   . Asthma     "very mild"  . Complication of anesthesia     convulsion with epidural redose  . IDA (iron deficiency anemia)   . Cardiac dysrhythmia, unspecified   . Migraine, unspecified, without mention of intractable migraine without mention of status migrainosus   . Kidney stone   . Fibromyalgia January, 2016    Past Surgical History  Procedure Laterality Date  . Laparoscopic tubal ligation  09/16/2012    Procedure: LAPAROSCOPIC TUBAL LIGATION;  Surgeon: [name], MD;  Location: [facility];  Service: Gynecology;  Laterality: Bilateral;   Family History:  Family History  Problem Relation Age of Onset  . Diabetes Mother   . Arthritis Mother   . Kidney disease Mother   . Mental illness Mother   . Fibromyalgia Mother   . Heart disease Father   . Diabetes Father   . Hypertension Father   . Hyperlipidemia Father   . Mental illness Father     ptsd and bipolar  . Heart attack Father   . Depression Father   . Heart disease Paternal Uncle   . Diabetes Maternal Grandmother   . Arthritis Maternal Grandmother   . Cancer Paternal Grandmother     colon  . Hyperlipidemia Paternal Grandmother   . Hypertension Paternal Grandmother   . Diabetes Paternal Grandfather   . Hyperlipidemia Paternal Grandfather   . Hypertension Paternal Grandfather   . Mental illness Paternal Grandfather   . Heart disease Paternal Grandfather   . Other Neg Hx   . Migraines Mother   .  Osteoporosis Mother    Social History:  History  Alcohol Use  . Yes    Comment: occasional      History  Drug Use No    History   Social History  . Marital Status: Married    Spouse Name: N/A  . Number of Children: N/A  . Years of Education: N/A   Social History Main Topics  . Smoking status: Former Smoker -- 2 years    Types: Cigarettes    Quit date: 06/05/2008  . Smokeless tobacco: Never Used  . Alcohol  Use: Yes     Comment: occasional   . Drug Use: No  . Sexual Activity: Yes    Birth Control/ Protection: Surgical   Other Topics Concern  . Not on file   Social History Narrative   She is a stay at home mother.   She lives with husband and two sons.   Highest level of education:  Two years of college   Additional History:    Sleep: Fair  Appetite:  Fair   Assessment:   Musculoskeletal: Strength & Muscle Tone: within normal limits Gait & Station: normal Patient leans: normal   Psychiatric Specialty Exam: Physical Exam  Review of Systems  Constitutional: Positive for malaise/fatigue.  Respiratory: Negative for cough.   Cardiovascular: Negative.   Skin: Negative for rash.  Neurological: Negative for tremors.  Psychiatric/Behavioral: Positive for hallucinations. The patient is nervous/anxious.     Blood pressure 92/60, pulse 107, temperature 97.7 F (36.5 C), temperature source Oral, resp. rate 18, height 5\' 7"  (1.702 m), weight 109.77 kg (242 lb), last menstrual period 06/14/2015, SpO2 100 %.Body mass index is 37.89 kg/(m^2).  General Appearance: Fairly Groomed  Patent attorneyye Contact::  Fair  Speech:  Clear and Coherent  Volume:  Normal  Mood:  Anxious and worried  Affect:  appropriate  Thought Process:  Coherent and Goal Directed  Orientation:  Full (Time, Place, and Person)  Thought Content:  symptoms events worries concerns  Suicidal Thoughts:  No  Homicidal Thoughts:  No  Memory:  Immediate;   Fair Recent;   Fair Remote;   Fair  Judgement:  Fair  Insight:  Present  Psychomotor Activity:  Normal  Concentration:  Fair  Recall:  FiservFair  Fund of Knowledge:Fair  Language: Fair  Akathisia:  No  Handed:  Right  AIMS (if indicated):     Assets:  Desire for Improvement Housing Social Support  ADL's:  Intact  Cognition: WNL  Sleep:  Number of Hours: 6.5     Current Medications: Current Facility-Administered Medications  Medication Dose Route Frequency Provider  Last Rate Last Dose  . acetaminophen (TYLENOL) tablet 650 mg  650 mg Oral Q6H PRN Adonis BrookSheila Agustin, NP      . albuterol (PROVENTIL HFA;VENTOLIN HFA) 108 (90 BASE) MCG/ACT inhaler 2 puff  2 puff Inhalation Q6H PRN Kerry HoughSpencer E Simon, PA-C   2 puff at 07/07/15 0840  . alum & mag hydroxide-simeth (MAALOX/MYLANTA) 200-200-20 MG/5ML suspension 30 mL  30 mL Oral Q4H PRN Adonis BrookSheila Agustin, NP   30 mL at 07/08/15 1635  . benztropine (COGENTIN) tablet 1 mg  1 mg Oral BID Rachael FeeIrving A Lugo, MD   1 mg at 07/09/15 1202  . haloperidol (HALDOL) tablet 2 mg  2 mg Oral TID Rachael FeeIrving A Lugo, MD   2 mg at 07/09/15 1202  . ibuprofen (ADVIL,MOTRIN) tablet 600 mg  600 mg Oral Q6H PRN Adonis BrookSheila Agustin, NP   600 mg at 07/08/15 2109  .  ipratropium-albuterol (DUONEB) 0.5-2.5 (3) MG/3ML nebulizer solution 3 mL  3 mL Nebulization Q6H PRN Adonis Brook, NP   3 mL at 07/08/15 1809  . lithium carbonate (LITHOBID) CR tablet 600 mg  600 mg Oral q morning - 10a Craige Cotta, MD   600 mg at 07/09/15 1201  . lithium carbonate (LITHOBID) CR tablet 600 mg  600 mg Oral QHS Rachael Fee, MD   600 mg at 07/08/15 2110  . LORazepam (ATIVAN) tablet 1 mg  1 mg Oral Q6H PRN Rachael Fee, MD   1 mg at 07/08/15 1922  . magnesium hydroxide (MILK OF MAGNESIA) suspension 30 mL  30 mL Oral Daily PRN Adonis Brook, NP      . ondansetron [facility]) tablet 4 mg  4 mg Oral Q8H PRN Shuvon B Rankin, NP   4 mg at 07/07/15 1528  . QUEtiapine (SEROQUEL) tablet 800 mg  800 mg Oral QHS Rachael Fee, MD   800 mg at 07/08/15 2110  . venlafaxine XR (EFFEXOR-XR) 24 hr capsule 75 mg  75 mg Oral Q breakfast Craige Cotta, MD   75 mg at 07/09/15 1202    Lab Results:  No results found for this or any previous visit (from the past 48 hour(s)).  Physical Findings: AIMS:  , ,  ,  ,    CIWA:    COWS:     Treatment Plan Summary: Daily contact with patient to assess and evaluate symptoms and progress in treatment and Medication management Supportive approach/coping  skills Hallucinations; continue the Haldol at 2 mg TID and reassess Insomnia/mood instability; will continue the Seroquel 800 mg HS Will continue to work with CBT/mindfulness Monitor vitals and discussed to take time to get up from bed and walk to prevent dizziness.  Medical Decision Making:  Review of Psycho-Social Stressors (1), Review of Medication Regimen & Side Effects (2) and Review of New Medication or Change in Dosage (2)     Teresa Bartlett 07/09/2015, 12:33 PM

## 2015-07-10 MED ORDER — HALOPERIDOL 5 MG PO TABS
5.0000 mg | ORAL_TABLET | Freq: Two times a day (BID) | ORAL | Status: DC
  Administered 2015-07-11 – 2015-07-12 (×3): 5 mg via ORAL
  Filled 2015-07-10 (×3): qty 1
  Filled 2015-07-10: qty 6
  Filled 2015-07-10: qty 1
  Filled 2015-07-10: qty 6
  Filled 2015-07-10: qty 1

## 2015-07-10 NOTE — [facility] Group Notes (Signed)
[facility] Group Notes:  (Clinical Social Work)  07/10/2015  [facility] Group Notes:  (Clinical Social Work)  07/10/2015  11:00AM-12:00PM  Summary of Progress/Problems:  The main focus of today's process group was to listen to a variety of genres of music and to identify that different types of music provoke different responses.  The patient then was able to identify personally what was soothing for them, as well as energizing.  Handouts were used to record feelings evoked, as well as how patient can personally use this knowledge in sleep habits, with depression, and with other symptoms.  The patient expressed understanding of concepts, as well as knowledge of how each type of music affected her and how this can be used at home as a wellness/recovery tool.  She was annoyed and left the room twice when other patients were singing very loudly, but returned  Calmly.  Type of Therapy:  Music Therapy   Participation Level:  Active  Participation Quality:  Attentive and Sharing  Affect:  Blunted  Cognitive:  Oriented  Insight:  Engaged  Engagement in Therapy:  Engaged  Modes of Intervention:   Activity, Exploration  Ambrose MantleMareida Grossman-Orr, LCSW 07/10/2015

## 2015-07-10 NOTE — [facility] Group Notes (Signed)
[facility] Group Notes:  (Nursing/MHT/Case Management/Adjunct)  Date:  07/10/2015  Time:  1:15 PM  Type of Therapy:  Psychoeducational Skills  Participation Level:  Active  Participation Quality:  Appropriate  Affect:  Appropriate  Cognitive:  Appropriate  Insight:  Appropriate  Engagement in Group:  Engaged  Modes of Intervention:  Discussion  Summary of Progress/Problems: Pt did attend self inventory group, pt reported that she was negative SI/HI, no AH/VH noted. Pt rated her depression as a 5, and her helplessness/hopelessness as a 3.     Pt reported no issues or concerns. Pt reported that her healthy support systems were her family and husband.   Jacquelyne BalintForrest, Anisah Kuck Shanta 07/10/2015, 1:15 PM

## 2015-07-10 NOTE — Progress Notes (Signed)
D. Pt had been up and visible in milieu this evening, has participated in group activity. Pt has appeared depressed and anxious and spoke about husband coming to visit and that the visit did not go well. Pt does continue to report some on-going hallucinations and is hopeful that the increase in haldol will help. Pt did receive all medications without incident and was seen picking at skin and did speak with pt and pt spoke about keeping her arms covered so she won't pick at her sores. A. Support and encouragement provided. R. Safety maintained, will continue to monitor.

## 2015-07-10 NOTE — Progress Notes (Signed)
Patient ID: Teresa HoltsAlexandria Bartlett, female   DOB: 1990-03-29, 25 y.o.   MRN: [id number] [facility] MD Progress Note  07/10/2015 12:43 PM Teresa Bartlett  MRN:  [id number] Subjective:  Teresa Bartlett states that she is sleeping better.Less guarded and feels voices are more distant. Still remains somewhat concern that the voices are still there.  Tolerating medications.  She is feeling combination of haldol and seroquel is helping but may need some more adjustment. She is not scratching and states adding haldol has helped since it has not been tried and feeling more clear in her toughts.  No involuntary movements.  No dizziness today Principal Problem: Severe recurrent major depressive disorder with psychotic features Diagnosis:   Patient Active Problem List   Diagnosis Date Noted  . Bipolar 1 disorder [F31.9] 07/06/2015  . Severe recurrent major depressive disorder with psychotic features [F33.3]   . Schizoaffective disorder [F25.9] 06/28/2015  . Suicidal ideation [R45.851]   . Kidney stone [N20.0] 04/25/2015  . Fibromyalgia [M79.7] 04/25/2015  . Left wrist pain [M25.532] 11/09/2014  . Fatigue [R53.83] 10/05/2014  . Polyarthralgia [M25.50] 10/05/2014  . Myalgia and myositis [M79.1, M60.9] 10/05/2014  . Vestibular migraine [G43.109] 08/24/2014  . Contact dermatitis [L25.9] 05/10/2014  . Palpitations [R00.2] 03/18/2014  . Benign paroxysmal positional vertigo [H81.10] 03/18/2014  . Right foot pain [M79.671] 11/16/2013  . Insomnia [G47.00] 10/26/2013  . Anxiety and depression [F41.8] 09/15/2013  . Routine general medical examination at a health care facility [Z00.00] 09/15/2013  . Left ankle injury [S99.912A] 07/29/2013  . Right shoulder pain [M25.511] 07/29/2013  . 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 Normal pregnancy [Z34.90] 06/15/2012   Total Time spent with patient: 30 minutes   Past Medical History:  Past Medical History  Diagnosis Date  . Hip dysplasia   . Sacroiliac joint  dysfunction of both sides   . IBS (irritable bowel syndrome)   . Normal pregnancy 06/15/2012  . Depression   . Anxiety   . Asthma     "very mild"  . Complication of anesthesia     convulsion with epidural redose  . IDA (iron deficiency anemia)   . Cardiac dysrhythmia, unspecified   . Migraine, unspecified, without mention of intractable migraine without mention of status migrainosus   . Kidney stone   . Fibromyalgia January, 2016    Past Surgical History  Procedure Laterality Date  . Laparoscopic tubal ligation  09/16/2012    Procedure: LAPAROSCOPIC TUBAL LIGATION;  Surgeon: Oliver PilaKathy W Richardson, MD;  Location: [facility];  Service: Gynecology;  Laterality: Bilateral;   Family History:  Family History  Problem Relation Age of Onset  . Diabetes Mother   . Arthritis Mother   . Kidney disease Mother   . Mental illness Mother   . Fibromyalgia Mother   . Heart disease Father   . Diabetes Father   . Hypertension Father   . Hyperlipidemia Father   . Mental illness Father     ptsd and bipolar  . Heart attack Father   . Depression Father   . Heart disease Paternal Uncle   . Diabetes Maternal Grandmother   . Arthritis Maternal Grandmother   . Cancer Paternal Grandmother     colon  . Hyperlipidemia Paternal Grandmother   . Hypertension Paternal Grandmother   . Diabetes Paternal Grandfather   . Hyperlipidemia Paternal Grandfather   . Hypertension Paternal Grandfather   . Mental illness Paternal Grandfather   . Heart disease Paternal Grandfather   . Other Neg Hx   .  Migraines Mother   . Osteoporosis Mother    Social History:  History  Alcohol Use  . Yes    Comment: occasional      History  Drug Use No    History   Social History  . Marital Status: Married    Spouse Name: N/A  . Number of Children: N/A  . Years of Education: N/A   Social History Main Topics  . Smoking status: Former Smoker -- 2 years    Types: Cigarettes    Quit date: 06/05/2008  . Smokeless  tobacco: Never Used  . Alcohol Use: Yes     Comment: occasional   . Drug Use: No  . Sexual Activity: Yes    Birth Control/ Protection: Surgical   Other Topics Concern  . Not on file   Social History Narrative   She is a stay at home mother.   She lives with husband and two sons.   Highest level of education:  Two years of college   Additional History:    Sleep: Fair  Appetite:  Fair   Assessment:   Musculoskeletal: Strength & Muscle Tone: within normal limits Gait & Station: normal Patient leans: normal   Psychiatric Specialty Exam: Physical Exam  Review of Systems  Constitutional: Positive for malaise/fatigue.  Respiratory: Negative for cough.   Cardiovascular: Negative for chest pain.  Skin: Negative for rash.  Neurological: Negative for tremors.  Psychiatric/Behavioral: Positive for hallucinations. The patient is nervous/anxious.     Blood pressure 96/44, pulse 125, temperature 99.3 F (37.4 C), temperature source Oral, resp. rate 18, height  (1.702 m), weight 109.77 kg (242 lb), last menstrual period 06/14/2015, SpO2 100 %.Body mass index is 37.89 kg/(m^2).  General Appearance: Fairly Groomed  Patent attorney::  Fair  Speech:  Clear and Coherent  Volume:  Normal  Mood:  Anxious and worried  Affect:  appropriate  Thought Process:  Coherent and Goal Directed  Orientation:  Full (Time, Place, and Person)  Thought Content:  symptoms events worries concerns  Suicidal Thoughts:  No  Homicidal Thoughts:  No  Memory:  Immediate;   Fair Recent;   Fair Remote;   Fair  Judgement:  Fair  Insight:  Present  Psychomotor Activity:  Normal  Concentration:  Fair  Recall:  [employer] of Knowledge:Fair  Language: Fair  Akathisia:  No  Handed:  Right  AIMS (if indicated):     Assets:  Desire for Improvement Housing Social Support  ADL's:  Intact  Cognition: WNL  Sleep:  Number of Hours: 6.75     Current Medications: Current Facility-Administered  Medications  Medication Dose Route Frequency Provider Last Rate Last Dose  . acetaminophen (TYLENOL) tablet 650 mg  650 mg Oral Q6H PRN Adonis Brook, NP      . albuterol (PROVENTIL HFA;VENTOLIN HFA) 108 (90 BASE) MCG/ACT inhaler 2 puff  2 puff Inhalation Q6H PRN Kerry Hough, PA-C   2 puff at 07/07/15 0840  . alum & mag hydroxide-simeth (MAALOX/MYLANTA) 200-200-20 MG/5ML suspension 30 mL  30 mL Oral Q4H PRN Adonis Brook, NP   30 mL at 07/08/15 1635  . benztropine (COGENTIN) tablet 1 mg  1 mg Oral BID Rachael Fee, MD   1 mg at 07/10/15 0854  . haloperidol (HALDOL) tablet 2 mg  2 mg Oral TID Rachael Fee, MD   2 mg at 07/10/15 0854  . ibuprofen (ADVIL,MOTRIN) tablet 600 mg  600 mg Oral Q6H PRN Adonis Brook, NP  600 mg at 07/10/15 1610  . ipratropium-albuterol (DUONEB) 0.5-2.5 (3) MG/3ML nebulizer solution 3 mL  3 mL Nebulization Q6H PRN Adonis Brook, NP   3 mL at 07/08/15 1809  . lithium carbonate (LITHOBID) CR tablet 600 mg  600 mg Oral q morning - 10a Craige Cotta, MD   600 mg at 07/10/15 0854  . lithium carbonate (LITHOBID) CR tablet 600 mg  600 mg Oral QHS Rachael Fee, MD   600 mg at 07/09/15 2108  . LORazepam (ATIVAN) tablet 1 mg  1 mg Oral Q6H PRN Rachael Fee, MD   1 mg at 07/09/15 2108  . magnesium hydroxide (MILK OF MAGNESIA) suspension 30 mL  30 mL Oral Daily PRN Adonis Brook, NP      . ondansetron [facility]) tablet 4 mg  4 mg Oral Q8H PRN Shuvon B Rankin, NP   4 mg at 07/07/15 1528  . QUEtiapine (SEROQUEL) tablet 800 mg  800 mg Oral QHS Rachael Fee, MD   800 mg at 07/09/15 2108  . venlafaxine XR (EFFEXOR-XR) 24 hr capsule 75 mg  75 mg Oral Q breakfast Craige Cotta, MD   75 mg at 07/10/15 9604    Lab Results:  No results found for this or any previous visit (from the past 48 hour(s)).  Physical Findings: AIMS:  , ,  ,  ,    CIWA:    COWS:     Treatment Plan Summary: Daily contact with patient to assess and evaluate symptoms and progress in treatment and  Medication management Supportive approach/coping skills Hallucinations; change and increase haldol to 5mg  bid Insomnia/mood instability; will continue the Seroquel 800 mg HS Will continue to work with CBT/mindfulness Monitor vitals and discussed to take time to get up from bed and walk to prevent dizziness.  Medical Decision Making:  Review of Psycho-Social Stressors (1), Review of Medication Regimen & Side Effects (2) and Review of New Medication or Change in Dosage (2)     Kenson Groh 07/10/2015, 12:43 PM

## 2015-07-10 NOTE — Progress Notes (Signed)
Patient ID: Teresa HoltsAlexandria Amsler, female   DOB: [birth year], 25 y.o.   MRN: [id number]  D: Pt continues to be very flat and depressed on the unit. Pt also continues to have AH/VH on and off, patient reported that they are random. Pt reported on her self inventory sheet that her depression was a 3, her hopelessness was a 2, and her anxiety was a 2. Pt reported that her goal for today was to rest up and keep working towards discharge. Pt did complain of some anxiety and chest tightness, she was given Ativan and as needed inhaler. Patient reported some relief. Pt reported that she wanted her haldol to be increased due to the voices, Dr. Fredda HammedAktar was made aware new orders noted. Pt reported being negative SI/HI, no AH/VH noted at time of assessment. A: 15 min checks continued for patient safety. R: Pt safety maintained.

## 2015-07-11 ENCOUNTER — Encounter (HOSPITAL_COMMUNITY): Payer: Self-pay

## 2015-07-11 ENCOUNTER — Other Ambulatory Visit (HOSPITAL_COMMUNITY): Payer: Self-pay

## 2015-07-11 MED ORDER — LITHIUM CARBONATE ER 300 MG PO TBCR
600.0000 mg | EXTENDED_RELEASE_TABLET | Freq: Every day | ORAL | Status: DC
  Administered 2015-07-11: 600 mg via ORAL
  Filled 2015-07-11 (×3): qty 2

## 2015-07-11 MED ORDER — QUETIAPINE FUMARATE 400 MG PO TABS
800.0000 mg | ORAL_TABLET | Freq: Every day | ORAL | Status: DC
  Administered 2015-07-11: 800 mg via ORAL
  Filled 2015-07-11: qty 2
  Filled 2015-07-11: qty 6
  Filled 2015-07-11: qty 2

## 2015-07-11 NOTE — [facility] Group Notes (Signed)
[facility] LCSW Group Therapy  07/11/2015   1:15 PM   Type of Therapy:  Group Therapy  Participation Level:  Active  Participation Quality:  Attentive, Sharing and Supportive  Affect:  Depressed and Flat  Cognitive:  Alert and Oriented  Insight:  Developing/Improving and Engaged  Engagement in Therapy:  Developing/Improving and Engaged  Modes of Intervention:  Clarification, Confrontation, Discussion, Education, Exploration, Limit-setting, Orientation, Problem-solving, Rapport Building, Dance movement psychotherapisteality Testing, Socialization and Support  Summary of Progress/Problems: Pt identified obstacles faced currently and processed barriers involved in overcoming these obstacles. Pt identified steps necessary for overcoming these obstacles and explored motivation (internal and external) for facing these difficulties head on. Pt further identified one area of concern in their lives and chose a goal to focus on for today. Pt shared that her biggest obstacle is finding what to do with idle time between treatment (IOP, PHP) and her kids being in daycare.  Discussed distraction and journaling as coping skills that have worked well for pt before.  Pt actively participated and was engaged in group discussion and engaged peers in discussion on what could help them as well.    Teresa IvanChelsea Horton, LCSW 07/11/2015  3:25 PM

## 2015-07-11 NOTE — Progress Notes (Signed)
Adult Psychoeducational Group Note  Date:  07/11/2015 Time:  8:58 PM  Group Topic/Focus:  Wrap-Up Group:   The focus of this group is to help patients review their daily goal of treatment and discuss progress on daily workbooks.  Participation Level:  Active  Participation Quality:  Appropriate  Affect:  Appropriate  Cognitive:  Appropriate  Insight: Appropriate  Engagement in Group:  Engaged  Modes of Intervention:  Discussion  Additional Comments: The patient expressed hat she attended group.The patient said that group to her was about not making quick decisions.  Octavio Mannshigpen, Hopelynn Gartland Lee 07/11/2015, 8:58 PM

## 2015-07-11 NOTE — Plan of Care (Signed)
Problem: Diagnosis: Increased Risk For Suicide Attempt Goal: STG-Patient Will Attend All Groups On The Unit Outcome: Progressing Pt attended and participate in evening wrap up group.

## 2015-07-11 NOTE — [facility] Group Notes (Signed)
[facility]   07/11/2015 8:45 AM  Participation Quality:  Alert, Appropriate and Oriented  Mood/Affect:  Calm  Depression Rating:  1  Anxiety Rating:  5  Thoughts of Suicide:  Pt denies HI, reports SI is always there due to voices saying it but feels comfortable today managing the voices  Will you contract for safety?   Yes  Current AVH:  Pt reports hearing a negative voice and a positive voice today but is able to manage them today  Plan for Discharge/Comments:  Pt attended discharge planning group and actively participated in group.  CSW provided pt with today's workbook.  Pt reports feeling sleepy and dizzy today, but otherwise feels she is doing better.  Pt reports feeling anxious about returning home as she has not been able to stay out of a hospital for the last month.  Pt [facility]'t want to return and would rather do IOP with [facility].  Pt [facility] that [facility]  Pt plans to return home.  Pt is hopeful to discharge tomorrow.    Transportation Means: Pt reports access to transportation  Supports: No supports mentioned at this time  Reyes IvanChelsea Horton, LCSW 07/11/2015 9:38 AM

## 2015-07-11 NOTE — Plan of Care (Addendum)
Problem: Alteration in mood Goal: LTG-Patient reports reduction in suicidal thoughts (Patient reports reduction in suicidal thoughts and is able to verbalize a safety plan for whenever patient is feeling suicidal)  Outcome: Progressing Pt denied SI when assessed. Pt has made not gestures to harm self or dig wound on her left arm. Safety maintained on Q 15 minutes checks as ordered without incident.

## 2015-07-11 NOTE — Tx Team (Addendum)
Interdisciplinary Treatment Plan Update (Adult)  Date: 07/11/2015  Time Reviewed:  9:45 AM  Progress in Treatment: Attending groups: Yes Participating in groups:  Yes Taking medication as prescribed:  Yes Tolerating medication:  Yes Family/Significant othe contact made: Yes, with husband Patient understands diagnosis:  Yes Discussing patient identified problems/goals with staff:  Yes Medical problems stabilized or resolved:  Yes Denies suicidal/homicidal ideation: Yes Issues/concerns per patient self-inventory:  Yes Other:  New problem(s) identified: N/A  Discharge Plan or Barriers: Pt to return home and has follow up scheduled at [facility].    Reason for Continuation of Hospitalization: Anxiety Depression Medication Stabilization Psychosis  Comments: Pt continues to have auditory hallucinations but appears insightful on how to manage them.  Pt reports feeling sleepy and dizzy today from medication changes.   Estimated length of stay: 1 day - d/c tomorrow  For review of initial/current patient goals, please see plan of care.  Attendees: Patient:     Family:     Physician:  Dr. Tawni CarnesSaranga 07/11/2015 9:55 AM   Nursing:   Waynetta SandyJan Wright, RN 07/11/2015 9:55 AM   Clinical Social Worker:  Reyes Ivanhelsea Horton, LCSW 07/11/2015 9:55 AM   Other: Sherryl Mangeslivette Wesseh, RN 07/11/2015 9:55 AM   Other:     Other:     Other:     Other:    Other:    Other:    Other:    Other:    Other:     Scribe for Treatment Team:   Carmina MillerHorton, Celestia Duva Nicole, 07/11/2015 9:55 AM

## 2015-07-11 NOTE — Progress Notes (Signed)
D: Patient reports feeling anxious about her family leaving. Pt reported her husband surprised her by bringing her daughter to see her. Pt stated she is discharging tomorrow and will follow up with IOP at [facility]. Pt mood and affect appeared anxious. Pt denies SI/HI/AVH and pain. Pt attended and participate in evening wrap up group. Cooperative with assessment.   A: Met with pt 1:1. Medications administered as prescribed. Support and encouragement provided. R: Patient remains safe. She is complaint with medications.

## 2015-07-11 NOTE — Progress Notes (Signed)
Patient ID: Teresa Bartlett, female   DOB: May 08, 1990, 25 y.o.   MRN: [id number] [facility] MD Progress Note  07/11/2015 3:44 PM Annlouise Gerety  MRN:  [id number] Subjective:  Pt interviewed. Chart reviewed. Case discussed with unit staff. Pt reports pain when breathing this AM for 20 minutes, which she reports is typically due to fibromyalgia. Trinna Post states that she is sleeping better. Less guarded, and feels voices are more distant and brief. Pt may occasionally see something out of the corner of her eye, but is not concerned about this.   Tolerating medications.  She is feeling combination of haldol and seroquel is helping. Pt reports significant dizziness this morning for 1 hour (felt wobbly), which she feels is due to the increased haldol dose. Pt believes this dizziness will resolve after a few days, as it has in the past. She has not blacked out. She is not scratching and states adding haldol has helped since it has not been tried and feeling more clear in her toughts. Scratches/scabs are healing. Pt denies having BPPV, since she had extensive testing that was negative. No involuntary movements.  No dizziness currently. Principal Problem: Severe recurrent major depressive disorder with psychotic features Diagnosis:   Patient Active Problem List   Diagnosis Date Noted  . Bipolar 1 disorder [F31.9] 07/06/2015  . Severe recurrent major depressive disorder with psychotic features [F33.3]   . Schizoaffective disorder [F25.9] 06/28/2015  . Suicidal ideation [R45.851]   . Kidney stone [N20.0] 04/25/2015  . Fibromyalgia [M79.7] 04/25/2015  . Left wrist pain [M25.532] 11/09/2014  . Fatigue [R53.83] 10/05/2014  . Polyarthralgia [M25.50] 10/05/2014  . Myalgia and myositis [M79.1, M60.9] 10/05/2014  . Vestibular migraine [G43.109] 08/24/2014  . Contact dermatitis [L25.9] 05/10/2014  . Palpitations [R00.2] 03/18/2014  . Benign paroxysmal positional vertigo [H81.10] 03/18/2014  . Right foot pain  [M79.671] 11/16/2013  . Insomnia [G47.00] 10/26/2013  . Anxiety and depression [F41.8] 09/15/2013  . Routine general medical examination at a health care facility [Z00.00] 09/15/2013  . Left ankle injury [S99.912A] 07/29/2013  . Right shoulder pain [M25.511] 07/29/2013  . 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 Normal pregnancy [Z34.90] 06/15/2012   Total Time spent with patient: 30 minutes   Past Medical History:  Past Medical History  Diagnosis Date  . Hip dysplasia   . Sacroiliac joint dysfunction of both sides   . IBS (irritable bowel syndrome)   . Normal pregnancy 06/15/2012  . Depression   . Anxiety   . Asthma     "very mild"  . Complication of anesthesia     convulsion with epidural redose  . IDA (iron deficiency anemia)   . Cardiac dysrhythmia, unspecified   . Migraine, unspecified, without mention of intractable migraine without mention of status migrainosus   . Kidney stone   . Fibromyalgia January, 2016    Past Surgical History  Procedure Laterality Date  . Laparoscopic tubal ligation  09/16/2012    Procedure: LAPAROSCOPIC TUBAL LIGATION;  Surgeon: [name], MD;  Location: [facility];  Service: Gynecology;  Laterality: Bilateral;   Family History:  Family History  Problem Relation Age of Onset  . Diabetes Mother   . Arthritis Mother   . Kidney disease Mother   . Mental illness Mother   . Fibromyalgia Mother   . Heart disease Father   . Diabetes Father   . Hypertension Father   . Hyperlipidemia Father   . Mental illness Father     ptsd and bipolar  . Heart  attack Father   . Depression Father   . Heart disease Paternal Uncle   . Diabetes Maternal Grandmother   . Arthritis Maternal Grandmother   . Cancer Paternal Grandmother     colon  . Hyperlipidemia Paternal Grandmother   . Hypertension Paternal Grandmother   . Diabetes Paternal Grandfather   . Hyperlipidemia Paternal Grandfather   . Hypertension Paternal Grandfather   . Mental  illness Paternal Grandfather   . Heart disease Paternal Grandfather   . Other Neg Hx   . Migraines Mother   . Osteoporosis Mother    Social History:  History  Alcohol Use  . Yes    Comment: occasional      History  Drug Use No    History   Social History  . Marital Status: Married    Spouse Name: N/A  . Number of Children: N/A  . Years of Education: N/A   Social History Main Topics  . Smoking status: Former Smoker -- 2 years    Types: Cigarettes    Quit date: 06/05/2008  . Smokeless tobacco: Never Used  . Alcohol Use: Yes     Comment: occasional   . Drug Use: No  . Sexual Activity: Yes    Birth Control/ Protection: Surgical   Other Topics Concern  . Not on file   Social History Narrative   She is a stay at home mother.   She lives with husband and two sons.   Highest level of education:  Two years of college   Additional History:    Sleep: Fair  Appetite:  Fair   Assessment:   Musculoskeletal: Strength & Muscle Tone: within normal limits Gait & Station: normal Patient leans: normal   Psychiatric Specialty Exam: Physical Exam  Review of Systems  Constitutional: Positive for malaise/fatigue.  Respiratory: Negative for cough.   Cardiovascular: Negative for chest pain.  Skin: Negative for rash.  Neurological: Negative for tremors.  Psychiatric/Behavioral: Positive for hallucinations. The patient is nervous/anxious.     Blood pressure 120/60, pulse 112, temperature 98.9 F (37.2 C), temperature source Oral, resp. rate 16, height 5\' 7"  (1.702 m), weight 109.77 kg (242 lb), last menstrual period 06/14/2015, SpO2 100 %.Body mass index is 37.89 kg/(m^2).  General Appearance: Fairly Groomed  Patent attorneyye Contact::  Fair  Speech:  Clear and Coherent  Volume:  Normal  Mood:  Euthymic  Affect:  appropriate  Thought Process:  Coherent and Goal Directed  Orientation:  Full (Time, Place, and Person)  Thought Content:  Negative. Mild rumination about husband and  kids, and mild guilt about being in the hospital again.  Suicidal Thoughts:  No  Homicidal Thoughts:  No  Memory:  Immediate;   Fair Recent;   Fair Remote;   Fair  Judgement:  Fair  Insight:  Present  Psychomotor Activity:  Normal  Concentration:  Fair  Recall:  FiservFair  Fund of Knowledge:Fair  Language: Fair  Akathisia:  No  Handed:  Right  AIMS (if indicated):     Assets:  Desire for Improvement Housing Social Support  ADL's:  Intact  Cognition: WNL  Sleep:  Number of Hours: 6.75     Current Medications: Current Facility-Administered Medications  Medication Dose Route Frequency Provider Last Rate Last Dose  . acetaminophen (TYLENOL) tablet 650 mg  650 mg Oral Q6H PRN Adonis BrookSheila Agustin, NP      . albuterol (PROVENTIL HFA;VENTOLIN HFA) 108 (90 BASE) MCG/ACT inhaler 2 puff  2 puff Inhalation Q6H PRN Kerry HoughSpencer E Simon,  PA-C   2 puff at 07/10/15 1319  . alum & mag hydroxide-simeth (MAALOX/MYLANTA) 200-200-20 MG/5ML suspension 30 mL  30 mL Oral Q4H PRN Adonis Brook, NP   30 mL at 07/08/15 1635  . benztropine (COGENTIN) tablet 1 mg  1 mg Oral BID Rachael Fee, MD   1 mg at 07/11/15 4098  . haloperidol (HALDOL) tablet 5 mg  5 mg Oral BID Thresa Ross, MD   5 mg at 07/11/15 0809  . ibuprofen (ADVIL,MOTRIN) tablet 600 mg  600 mg Oral Q6H PRN Adonis Brook, NP   600 mg at 07/11/15 1355  . ipratropium-albuterol (DUONEB) 0.5-2.5 (3) MG/3ML nebulizer solution 3 mL  3 mL Nebulization Q6H PRN Adonis Brook, NP   3 mL at 07/10/15 1245  . lithium carbonate (LITHOBID) CR tablet 600 mg  600 mg Oral q morning - 10a Craige Cotta, MD   600 mg at 07/11/15 0807  . lithium carbonate (LITHOBID) CR tablet 600 mg  600 mg Oral QHS Rachael Fee, MD   600 mg at 07/10/15 2102  . LORazepam (ATIVAN) tablet 1 mg  1 mg Oral Q6H PRN Rachael Fee, MD   1 mg at 07/11/15 0711  . magnesium hydroxide (MILK OF MAGNESIA) suspension 30 mL  30 mL Oral Daily PRN Adonis Brook, NP      . ondansetron [facility]) tablet 4  mg  4 mg Oral Q8H PRN Shuvon B Rankin, NP   4 mg at 07/07/15 1528  . QUEtiapine (SEROQUEL) tablet 800 mg  800 mg Oral QHS Rachael Fee, MD   800 mg at 07/10/15 2102  . venlafaxine XR (EFFEXOR-XR) 24 hr capsule 75 mg  75 mg Oral Q breakfast Craige Cotta, MD   75 mg at 07/11/15 1191    Lab Results:  No results found for this or any previous visit (from the past 48 hour(s)).  Physical Findings: AIMS:  , ,  ,  ,    CIWA:    COWS:     Treatment Plan Summary: Daily contact with patient to assess and evaluate symptoms and progress in treatment and Medication management Supportive approach/coping skills Hallucinations; continue haldol 5 mg bid Insomnia/mood instability; will continue the Seroquel 800 mg HS Will continue to work with CBT/mindfulness Monitor vitals and discussed to take time to get up from bed and walk to prevent dizziness. Consider discharge home tomorrow. Pt will follow up with IOP on 07/18/15.  Medical Decision Making:  Review of Psycho-Social Stressors (1), Review of Medication Regimen & Side Effects (2) and Review of New Medication or Change in Dosage (2)     Nate Perri 07/11/2015, 3:44 PM  Addendum: Spoke to pt's husband Earna Coder [phone]). Husband is requesting a family meeting with social worker tomorrow, to discuss the kids and daycare. Husband would like to have family meeting, before he decides if he is comfortable with taking pt home tomorrow. Husband reports pt's symptoms have worsened over past 2 months, and is concerned about when the next relapse will occur.

## 2015-07-11 NOTE — Progress Notes (Signed)
D: Pt visible in dayroom majority of this shift. Observed in scheduled groups. Presents with bright affect and anxious mood. Denies SI, HI and VH when assessed. Pt endorsed AH stated "It's mild though, I hear the good voices telling me I'll will be fine and not to hurt myself". Pt reports her depression 2/10, hopelessness 0/10 and anxiety 5/10. Pt's goal for today is to "continue making positive stands towards recovery" which she plans to attain by"going to all groups".  A: All medications administered as per MD's orders. 1:1 contact made with pt to address needs and concerns. Support and encouragement offered. Safety maintained on Q 15 minutes checks as ordered without incident to note thus far this shift.  R: Pt denies adverse drug reactions when assessed. Compliant with medications as ordered and receptive to care. Remains safe on and off unit.

## 2015-07-12 ENCOUNTER — Other Ambulatory Visit (HOSPITAL_COMMUNITY): Payer: Self-pay

## 2015-07-12 MED ORDER — VENLAFAXINE HCL ER 75 MG PO CP24: 75.0000 mg | ORAL_CAPSULE | Freq: Every day | ORAL | Status: DC

## 2015-07-12 MED ORDER — LITHIUM CARBONATE ER 300 MG PO TBCR: 600.0000 mg | EXTENDED_RELEASE_TABLET | Freq: Every day | ORAL | Status: DC

## 2015-07-12 MED ORDER — BENZTROPINE MESYLATE 1 MG PO TABS: 1.0000 mg | ORAL_TABLET | Freq: Two times a day (BID) | ORAL | Status: DC

## 2015-07-12 MED ORDER — LITHIUM CARBONATE ER 300 MG PO TBCR: 600.0000 mg | EXTENDED_RELEASE_TABLET | Freq: Every morning | ORAL | Status: DC

## 2015-07-12 MED ORDER — FLINTSTONES COMPLETE 60 MG PO CHEW: 1.0000 | CHEWABLE_TABLET | Freq: Every day | ORAL | Status: DC

## 2015-07-12 MED ORDER — IBUPROFEN 600 MG PO TABS: 600.0000 mg | ORAL_TABLET | Freq: Four times a day (QID) | ORAL | Status: DC | PRN

## 2015-07-12 MED ORDER — HALOPERIDOL 5 MG PO TABS: 5.0000 mg | ORAL_TABLET | Freq: Two times a day (BID) | ORAL | Status: DC

## 2015-07-12 MED ORDER — QUETIAPINE FUMARATE 400 MG PO TABS: 800.0000 mg | ORAL_TABLET | Freq: Every day | ORAL | Status: DC

## 2015-07-12 NOTE — [facility] Group Notes (Signed)
[facility] Group Notes:  (Nursing/MHT/Case Management/Adjunct)  Date:  07/12/2015  Time:  0900  Type of Therapy:  Nurse Education  Participation Level:  Active  Participation Quality:  Appropriate and Attentive  Affect:  Appropriate  Cognitive:  Alert and Appropriate  Insight:  Appropriate  Engagement in Group:  Engaged  Modes of Intervention:  Education  Summary of Progress/Problems: Patient was attentive and engaged for group.  Patient shared that [place] place [place]  Larry SierrasMiddleton, Cailee Blanke P 07/12/2015, 12:02 PM

## 2015-07-12 NOTE — Progress Notes (Signed)
D- Patient is pleasant and cooperative this shift.  She expresses readiness for discharge. Denies SI, HI, AVH, and pain. Prior to shift, patient had c/o shoulder pain which was relieved with pain medication. On patient's self-inventory, she rates her depression a "0", feelings of hopelessness a "0" and an anxiety "1-2" with "10" being the worst.  No other complaints.  Patient is looking forward to seeing her children upon discharge.   A- Scheduled medications administered to patient, per MD orders. Support and encouragement provided.  Routine safety checks conducted every 15 minutes.  Patient informed to notify staff with problems or concerns. R- No adverse drug reactions noted. Patient contracts for safety at this time. Patient compliant with medications and treatment plan. Patient interacts well with others on the unit.  Patient remains safe at this time.
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
## 2015-07-12 NOTE — Progress Notes (Signed)
  [facility] :  Will you be returning to the same living situation after discharge:  Yes,  home w husband At discharge, do you have transportation home?: Yes, patient says her car is at hospital Do you have the ability to pay for your medications: Yes,  insurance  Release of information consent forms completed and in the chart;  Patient's signature needed at discharge.  Patient to Follow up at: Follow-up Information    Follow up with [facility] On 07/18/2015.   Why:  Monday AM at 8:45 with [place] [facility].  Groups starts at 9:00.   Contact information:   [address]  [place] [phone]] [phone]      Safety Planning and Suicide Prevention discussed: Yes, completed w husband  Have you used any form of tobacco in the last 30 days? (Cigarettes, Smokeless Tobacco, Cigars, and/or Pipes): No  Has patient been referred to the Quitline?: N/A patient is not a smoker  Sallee LangeCunningham, Teresa Bartlett 07/12/2015, 2:15 PM

## 2015-07-12 NOTE — [facility] Suicide Risk Assessment (Addendum)
[facility] Discharge Suicide Risk Assessment   Demographic Factors:  25 year old married female, lives with husband and two young children.  Total Time spent with patient: 30 minutes  Musculoskeletal: Strength & Muscle Tone: within normal limits Gait & Station: normal Patient leans: N/A  Psychiatric Specialty Exam: Physical Exam  ROS  Blood pressure 108/58, pulse 129, temperature 98.9 F (37.2 C), temperature source Oral, resp. rate 16, height  (1.702 m), weight 242 lb (109.77 kg), last menstrual period 06/14/2015, SpO2 100 %.Body mass index is 37.89 kg/(m^2).  General Appearance: Well Groomed  Eye Contact::  Good  Speech:  Normal Rate409  Volume:  Normal  Mood:  euthymic, at this time states " my mood is good "  Affect:  Appropriate and Full Range  Thought Process:  Goal Directed and Linear  Orientation:  Full (Time, Place, and Person)  Thought Content:  states hallucinations have essentially resolved- not internally preoccupied, no delusions  Suicidal Thoughts:  No- denies any self injurious or suicidal ideations  Homicidal Thoughts:  No  Memory:  Recent and remote grossly intact    Judgement:  Other:  improved   Insight:  Good  Psychomotor Activity:  Normal  Concentration:  Good  Recall:  Good  Fund of Knowledge:Good  Language: Good  Akathisia:  Negative  Handed:  Right  AIMS (if indicated):     Assets:  Communication Skills Desire for Improvement Housing Resilience Social Support  Sleep:  Number of Hours: 6.75  Cognition: WNL  ADL's:  Intact   Have you used any form of tobacco in the last 30 days? (Cigarettes, Smokeless Tobacco, Cigars, and/or Pipes): No  Has this patient used any form of tobacco in the last 30 days? (Cigarettes, Smokeless Tobacco, Cigars, and/or Pipes) No  Mental Status Per Nursing Assessment::   On Admission:     Current Mental Status by Physician: At this time patient is much improved compared to her admission. She is presenting fully  alert, attentive, good eye contact, speech normal, mood euthymic today, affect appropriate, full in range, no thought disorder, not suicidal , not homicidal, no ongoing self injurious  Or self cutting thoughts, hallucinations have resolved, no delusions, future oriented, looking forward to reuniting with her family.   Loss Factors: Husband has history of bipolar disorder .  Historical Factors: Two prior psychiatric admissions, history of brief episodes of psychosis and  Episodes of self cutting  Risk Reduction Factors:   Responsible for children under 27 years of age, Sense of responsibility to family, Living with another person, especially a relative, Positive social support and Positive coping skills or problem solving skills  Continued Clinical Symptoms:  As noted, at this time much improved- no SI, no self injurious ideations, no psychotic symptoms  Cognitive Features That Contribute To Risk:  No gross cognitive deficits noted upon discharge. Is alert , attentive, and oriented x 3     Suicide Risk:  Mild:  Suicidal ideation of limited frequency, intensity, duration, and specificity.  There are no identifiable plans, no associated intent, mild dysphoria and related symptoms, good self-control (both objective and subjective assessment), few other risk factors, and identifiable protective factors, including available and accessible social support.  Principal Problem: Severe recurrent major depressive disorder with psychotic features Discharge Diagnoses:  Patient Active Problem List   Diagnosis Date Noted  . Bipolar 1 disorder [F31.9] 07/06/2015  . Severe recurrent major depressive disorder with psychotic features [F33.3]   . Schizoaffective disorder [F25.9] 06/28/2015  . Suicidal  ideation [R45.851]   . Kidney stone [N20.0] 04/25/2015  . Fibromyalgia [M79.7] 04/25/2015  . Left wrist pain [M25.532] 11/09/2014  . Fatigue [R53.83] 10/05/2014  . Polyarthralgia [M25.50] 10/05/2014  .  Myalgia and myositis [M79.1, M60.9] 10/05/2014  . Vestibular migraine [G43.109] 08/24/2014  . Contact dermatitis [L25.9] 05/10/2014  . Palpitations [R00.2] 03/18/2014  . Benign paroxysmal positional vertigo [H81.10] 03/18/2014  . Right foot pain [M79.671] 11/16/2013  . Insomnia [G47.00] 10/26/2013  . Anxiety and depression [F41.8] 09/15/2013  . Routine general medical examination at a health care facility [Z00.00] 09/15/2013  . Left ankle injury [S99.912A] 07/29/2013  . Right shoulder pain [M25.511] 07/29/2013  . 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 Normal pregnancy [Z34.90] 06/15/2012    Follow-up Information    Follow up with [facility] On 07/18/2015.   Why:  Monday AM at 8:45 with [place] [facility].  Groups starts at 9:00.   Contact information:   [address]  [place] [phone]      Plan Of Care/Follow-up recommendations:  Activity:  as tolerated  Diet:  Regular  Tests:  NA Other:  See below   Is patient on multiple antipsychotic therapies at discharge:  yes   Do you recommend tapering to monotherapy for antipsychotics?  No  - patient has responded well to combination therapy, and is currently tolerating it well.  Has Patient had three or more failed trials of antipsychotic monotherapy by history:  No  Recommended Plan for Multiple Antipsychotic Therapies: NA  Patient is leaving unit in good spirits, plans to return home. Follow up as above .    COBOS, FERNANDO 07/12/2015, 11:15 AM

## 2015-07-12 NOTE — Progress Notes (Signed)
Discharge- Patient verbalizes readiness for discharge: Denies SI/HI. A- Discharge instructions read and discussed with patient.  All belongings returned to patient. R- Patient cooperative with discharge process.  She verbalized understanding of discharge instructions.  Signed for return of belongings. Escorted to the lobby and was observed walking towards her car which was located in the outpatient parking lot.

## 2015-07-13 ENCOUNTER — Other Ambulatory Visit (HOSPITAL_COMMUNITY): Payer: Self-pay

## 2015-07-14 ENCOUNTER — Other Ambulatory Visit (HOSPITAL_COMMUNITY): Payer: Self-pay

## 2015-07-15 ENCOUNTER — Other Ambulatory Visit (HOSPITAL_COMMUNITY): Payer: Self-pay

## 2015-07-17 ENCOUNTER — Encounter (HOSPITAL_COMMUNITY): Payer: Self-pay | Admitting: Emergency Medicine

## 2015-07-17 ENCOUNTER — Emergency Department (INDEPENDENT_AMBULATORY_CARE_PROVIDER_SITE_OTHER)
Admission: EM | Admit: 2015-07-17 | Discharge: 2015-07-17 | Disposition: A | Discharge status: Home / Self Care | Payer: BLUE CROSS/BLUE SHIELD | Source: Home / Self Care | Attending: Family Medicine | Admitting: Family Medicine

## 2015-07-17 DIAGNOSIS — G8929 Other chronic pain: Secondary | ICD-10-CM

## 2015-07-17 DIAGNOSIS — M533 Sacrococcygeal disorders, not elsewhere classified: Secondary | ICD-10-CM | POA: Diagnosis not present

## 2015-07-17 MED ORDER — METHYLPREDNISOLONE ACETATE 80 MG/ML IJ SUSP
INTRAMUSCULAR | Status: AC
  Filled 2015-07-17: qty 1

## 2015-07-17 MED ORDER — METHYLPREDNISOLONE ACETATE 80 MG/ML IJ SUSP
80.0000 mg | Freq: Once | INTRAMUSCULAR | Status: AC
  Administered 2015-07-17: 80 mg via INTRAMUSCULAR

## 2015-07-17 NOTE — ED Notes (Signed)
Pain, generalized.  History of fibromyalgia, pain particularly in right

## 2015-07-17 NOTE — Discharge Instructions (Signed)
See your doctor if further problems and for change in management as needed.

## 2015-07-17 NOTE — ED Provider Notes (Signed)
CSN: [id number]     Arrival date & time 07/17/15  1403 History   First MD Initiated Contact with Patient 07/17/15 1538     Chief Complaint  Patient presents with  . Pain   (Consider location/radiation/quality/duration/timing/severity/associated sxs/prior Treatment) Patient is a 25 y.o. female presenting with back pain. The history is provided by the patient.  Back Pain Location:  Sacro-iliac joint Quality:  Stabbing Radiates to:  R posterior upper leg Pain severity:  Moderate Onset quality:  Gradual Duration:  2 days Progression:  Unchanged Chronicity:  Chronic Context comment:  Pt with fibromyalgia and chronic pain issues. Relieved by:  None tried Worsened by:  Nothing tried   Past Medical History  Diagnosis Date  . Hip dysplasia   . Sacroiliac joint dysfunction of both sides   . IBS (irritable bowel syndrome)   . Normal pregnancy 06/15/2012  . Depression   . Anxiety   . Asthma     "very mild"  . Complication of anesthesia     convulsion with epidural redose  . IDA (iron deficiency anemia)   . Cardiac dysrhythmia, unspecified   . Migraine, unspecified, without mention of intractable migraine without mention of status migrainosus   . Kidney stone   . Fibromyalgia January, 2016   Past Surgical History  Procedure Laterality Date  . Laparoscopic tubal ligation  09/16/2012    Procedure: LAPAROSCOPIC TUBAL LIGATION;  Surgeon: Oliver PilaKathy W Richardson, MD;  Location: [facility];  Service: Gynecology;  Laterality: Bilateral;   Family History  Problem Relation Age of Onset  . Diabetes Mother   . Arthritis Mother   . Kidney disease Mother   . Mental illness Mother   . Fibromyalgia Mother   . Heart disease Father   . Diabetes Father   . Hypertension Father   . Hyperlipidemia Father   . Mental illness Father     ptsd and bipolar  . Heart attack Father   . Depression Father   . Heart disease Paternal Uncle   . Diabetes Maternal Grandmother   . Arthritis Maternal Grandmother     . Cancer Paternal Grandmother     colon  . Hyperlipidemia Paternal Grandmother   . Hypertension Paternal Grandmother   . Diabetes Paternal Grandfather   . Hyperlipidemia Paternal Grandfather   . Hypertension Paternal Grandfather   . Mental illness Paternal Grandfather   . Heart disease Paternal Grandfather   . Other Neg Hx   . Migraines Mother   . Osteoporosis Mother    History  Substance Use Topics  . Smoking status: Former Smoker -- 2 years    Types: Cigarettes    Quit date: 06/05/2008  . Smokeless tobacco: Never Used  . Alcohol Use: Yes     Comment: occasional    OB History    Gravida Para Term Preterm AB TAB SAB Ectopic Multiple Living   2 2 2  0 0 0 0 0 0 2     Review of Systems  Constitutional: Negative.   Gastrointestinal: Negative.   Musculoskeletal: Positive for myalgias and back pain. Negative for joint swelling and gait problem.  Skin: Negative.     Allergies  Benadryl; Other; and Saphris  Home Medications   Prior to Admission medications   Medication Sig Start Date End Date Taking? Authorizing Provider  acetaminophen (TYLENOL) 325 MG tablet Take 650 mg by mouth every 6 (six) hours as needed for headache.    Historical Provider, MD  albuterol (PROVENTIL HFA;VENTOLIN HFA) 108 (90 BASE) MCG/ACT inhaler  Inhale 2 puffs into the lungs every 6 (six) hours as needed for wheezing or shortness of breath.    Historical Provider, MD  benztropine (COGENTIN) 1 MG tablet Take 1 tablet (1 mg total) by mouth 2 (two) times daily. 07/12/15   Thermon Leyland, NP  cetaphil (CETAPHIL) lotion Apply 1 application topically daily. Applies after removing makeup.    Historical Provider, MD  flintstones complete (FLINTSTONES) 60 MG chewable tablet Chew 1 tablet by mouth daily. 07/12/15   Thermon Leyland, NP  haloperidol (HALDOL) 5 MG tablet Take 1 tablet (5 mg total) by mouth 2 (two) times daily. 07/12/15   Thermon Leyland, NP  ibuprofen (ADVIL,MOTRIN) 600 MG tablet Take 1 tablet (600 mg  total) by mouth every 6 (six) hours as needed for mild pain or moderate pain. 07/12/15   Thermon Leyland, NP  lithium carbonate (LITHOBID) 300 MG CR tablet Take 2 tablets (600 mg total) by mouth every morning. 07/12/15   Thermon Leyland, NP  lithium carbonate (LITHOBID) 300 MG CR tablet Take 2 tablets (600 mg total) by mouth at bedtime. 07/12/15   Thermon Leyland, NP  LORazepam (ATIVAN) 1 MG tablet Take 1 mg by mouth daily as needed for anxiety.  04/12/15   Historical Provider, MD  ondansetron (ZOFRAN ODT) 4 MG disintegrating tablet Take 1 tablet (4 mg total) by mouth every 8 (eight) hours as needed for nausea or vomiting. 04/20/15   Francee Piccolo, PA-C  QUEtiapine (SEROQUEL) 400 MG tablet Take 2 tablets (800 mg total) by mouth at bedtime. 07/12/15   Thermon Leyland, NP  venlafaxine XR (EFFEXOR-XR) 75 MG 24 hr capsule Take 1 capsule (75 mg total) by mouth daily with breakfast. 07/12/15   Thermon Leyland, NP   BP 109/77 mmHg  Pulse 92  Temp(Src) 98.6 F (37 C) (Oral)  Resp 18  SpO2 98%  LMP 06/14/2015 Physical Exam  Constitutional: She is oriented to person, place, and time. She appears well-developed and well-nourished.  Musculoskeletal: She exhibits tenderness.       Arms: Neurological: She is alert and oriented to person, place, and time.  Skin: Skin is warm and dry.  Nursing note and vitals reviewed.   ED Course  Procedures (including critical care time) Labs Review Labs Reviewed - No data to display  Imaging Review No results found.   MDM   1. Chronic sacroiliac joint pain        Linna Hoff, MD 07/17/15 1557

## 2015-07-18 ENCOUNTER — Other Ambulatory Visit (HOSPITAL_COMMUNITY): Payer: Self-pay

## 2015-07-18 ENCOUNTER — Other Ambulatory Visit (HOSPITAL_COMMUNITY): Payer: BLUE CROSS/BLUE SHIELD | Admitting: Psychiatry

## 2015-07-18 ENCOUNTER — Encounter (HOSPITAL_COMMUNITY): Payer: Self-pay | Admitting: Psychiatry

## 2015-07-18 DIAGNOSIS — G4700 Insomnia, unspecified: Secondary | ICD-10-CM | POA: Diagnosis not present

## 2015-07-18 DIAGNOSIS — M797 Fibromyalgia: Secondary | ICD-10-CM | POA: Diagnosis not present

## 2015-07-18 DIAGNOSIS — F259 Schizoaffective disorder, unspecified: Secondary | ICD-10-CM | POA: Diagnosis not present

## 2015-07-18 DIAGNOSIS — Z87891 Personal history of nicotine dependence: Secondary | ICD-10-CM | POA: Diagnosis not present

## 2015-07-18 DIAGNOSIS — F333 Major depressive disorder, recurrent, severe with psychotic symptoms: Secondary | ICD-10-CM

## 2015-07-18 DIAGNOSIS — F315 Bipolar disorder, current episode depressed, severe, with psychotic features: Secondary | ICD-10-CM | POA: Diagnosis present

## 2015-07-18 DIAGNOSIS — F419 Anxiety disorder, unspecified: Secondary | ICD-10-CM | POA: Diagnosis not present

## 2015-07-18 NOTE — Progress Notes (Signed)
Psychiatric Initial Adult Assessment   Patient Identification: Shenaya Lebo MRN:  [id number] Date of Evaluation:  07/18/2015 Referral Source: inpatient at [facility] Chief Complaint:depression and hallucinations   Visit Diagnosis: bipolar disorder 1, current episode depressed with psychosis, rule out schizoaffective disorder Diagnosis:   Patient Active Problem List   Diagnosis Date Noted  . Bipolar 1 disorder [F31.9] 07/06/2015  . Severe recurrent major depressive disorder with psychotic features [F33.3]   . Schizoaffective disorder [F25.9] 06/28/2015  . Suicidal ideation [R45.851]   . Kidney stone [N20.0] 04/25/2015  . Fibromyalgia [M79.7] 04/25/2015  . Left wrist pain [M25.532] 11/09/2014  . Fatigue [R53.83] 10/05/2014  . Polyarthralgia [M25.50] 10/05/2014  . Myalgia and myositis [M79.1, M60.9] 10/05/2014  . Vestibular migraine [G43.109] 08/24/2014  . Contact dermatitis [L25.9] 05/10/2014  . Palpitations [R00.2] 03/18/2014  . Benign paroxysmal positional vertigo [H81.10] 03/18/2014  . Right foot pain [M79.671] 11/16/2013  . Insomnia [G47.00] 10/26/2013  . Anxiety and depression [F41.8] 09/15/2013  . Routine general medical examination at a health care facility [Z00.00] 09/15/2013  . Left ankle injury [S99.912A] 07/29/2013  . Right shoulder pain [M25.511] 07/29/2013  . 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 Normal pregnancy [Z34.90] 06/15/2012   History of Present Illness:  Ms Brazeau has been stressed out for many months mostly being with her 2 children ages 58 and 3 all day and cutting herself off from outside contacts.  She got depressed and started having hallucinations as well as suicidal thoughts.  She has been in and out of the hospital or partial hospital programs since.  Currently she is feeling better saying she has no suicidal thoughts, her children go to daycare, her husband is supportive and some friends have made re-contact with her.  She still has some  anxiety, some desire to cut herself and still hears the most negative voice and sees things though not as much.  Haldol 5 mg twice daily has helped she says. Elements:  Location:  depression and hallucinations. Quality:  still seeing and hearing things but not as badly. Severity:  no longer suicidal. Timing:  homebound with a special needs son and a threeyear old and isolating herself. Duration:  off and on for years currently about 3-4 months. Context:  as above. Associated Signs/Symptoms: Depression Symptoms:  depressed mood, anhedonia, insomnia, fatigue, feelings of worthlessness/guilt, difficulty concentrating, hopelessness, impaired memory, anxiety, (Hypo) Manic Symptoms:  Distractibility, Irritable Mood, Labiality of Mood, Anxiety Symptoms:  Excessive Worry, Psychotic Symptoms:  Hallucinations: Auditory Visual PTSD Symptoms: Negative  Past Medical History:  Past Medical History  Diagnosis Date  . Hip dysplasia   . Sacroiliac joint dysfunction of both sides   . IBS (irritable bowel syndrome)   . Normal pregnancy 06/15/2012  . Depression   . Anxiety   . Asthma     "very mild"  . Complication of anesthesia     convulsion with epidural redose  . IDA (iron deficiency anemia)   . Cardiac dysrhythmia, unspecified   . Migraine, unspecified, without mention of intractable migraine without mention of status migrainosus   . Kidney stone   . Fibromyalgia January, 2016    Past Surgical History  Procedure Laterality Date  . Laparoscopic tubal ligation  09/16/2012    Procedure: LAPAROSCOPIC TUBAL LIGATION;  Surgeon: [name], MD;  Location: [facility];  Service: Gynecology;  Laterality: Bilateral;   Family History:  Family History  Problem Relation Age of Onset  . Diabetes Mother   . Arthritis Mother   .  Kidney disease Mother   . Mental illness Mother   . Fibromyalgia Mother   . Heart disease Father   . Diabetes Father   . Hypertension Father   . Hyperlipidemia  Father   . Mental illness Father     ptsd and bipolar  . Heart attack Father   . Depression Father   . Heart disease Paternal Uncle   . Diabetes Maternal Grandmother   . Arthritis Maternal Grandmother   . Cancer Paternal Grandmother     colon  . Hyperlipidemia Paternal Grandmother   . Hypertension Paternal Grandmother   . Diabetes Paternal Grandfather   . Hyperlipidemia Paternal Grandfather   . Hypertension Paternal Grandfather   . Mental illness Paternal Grandfather   . Heart disease Paternal Grandfather   . Other Neg Hx   . Migraines Mother   . Osteoporosis Mother    Social History:   History   Social History  . Marital Status: Married    Spouse Name: N/A  . Number of Children: N/A  . Years of Education: N/A   Social History Main Topics  . Smoking status: Former Smoker -- 2 years    Types: Cigarettes    Quit date: 06/05/2008  . Smokeless tobacco: Never Used  . Alcohol Use: Yes     Comment: occasional   . Drug Use: No  . Sexual Activity: Yes    Birth Control/ Protection: Surgical   Other Topics Concern  . None   Social History Narrative   She is a stay at home mother.   She lives with husband and two sons.   Highest level of education:  Two years of college   Additional Social History: has fibromyalgia and joint pains particularly in her sacrum-hip Musculoskeletal: Strength & Muscle Tone: walks with pain  Gait & Station: normal Patient leans: N/A  Psychiatric Specialty Exam: HPI  ROS  Last menstrual period 06/14/2015.There is no weight on file to calculate BMI.  General Appearance: Well Groomed  Eye Contact:  Good  Speech:  Clear and Coherent  Volume:  Normal  Mood:  Anxious and Depressed  Affect:  Appropriate  Thought Process:  Coherent and Logical  Orientation:  Full (Time, Place, and Person)  Thought Content:  no  hallucinations at the moment  Suicidal Thoughts:  No  Homicidal Thoughts:  No  Memory:  Immediate;   Good Recent;   Good Remote;    Good  Judgement:  Intact  Insight:  Good  Psychomotor Activity:  Normal  Concentration:  Good  Recall:  Good  Fund of Knowledge:Good  Language: Good  Akathisia:  Negative  Handed:  Right  AIMS (if indicated):  0  Assets:  Communication Skills Desire for Improvement Financial Resources/Insurance Housing Intimacy Leisure Time Resilience Social Support Talents/Skills Transportation Vocational/Educational  ADL's:  Intact  Cognition: WNL  Sleep:  Better with medication   Is the patient at risk to self?  No. Has the patient been a risk to self in the past 6 months?  Yes.   Has the patient been a risk to self within the distant past?  Yes.   Is the patient a risk to others?  No. Has the patient been a risk to others in the past 6 months?  No. Has the patient been a risk to others within the distant past?  No.  Allergies:   Allergies  Allergen Reactions  . Benadryl [Diphenhydramine] Other (See Comments)    "its like speed"  . Other Hives  Glue used for EKG leads, other adhesives  . Saphris [Asenapine] Hives and Other (See Comments)    "Opposite effect made me sicker"   Current Medications: Current Outpatient Prescriptions  Medication Sig Dispense Refill  . acetaminophen (TYLENOL) 325 MG tablet Take 650 mg by mouth every 6 (six) hours as needed for headache.    . albuterol (PROVENTIL HFA;VENTOLIN HFA) 108 (90 BASE) MCG/ACT inhaler Inhale 2 puffs into the lungs every 6 (six) hours as needed for wheezing or shortness of breath.    . benztropine (COGENTIN) 1 MG tablet Take 1 tablet (1 mg total) by mouth 2 (two) times daily. 60 tablet 0  . cetaphil (CETAPHIL) lotion Apply 1 application topically daily. Applies after removing makeup.    . flintstones complete (FLINTSTONES) 60 MG chewable tablet Chew 1 tablet by mouth daily.    . haloperidol (HALDOL) 5 MG tablet Take 1 tablet (5 mg total) by mouth 2 (two) times daily. 60 tablet 0  . ibuprofen (ADVIL,MOTRIN) 600 MG tablet Take  1 tablet (600 mg total) by mouth every 6 (six) hours as needed for mild pain or moderate pain. 30 tablet 0  . lithium carbonate (LITHOBID) 300 MG CR tablet Take 2 tablets (600 mg total) by mouth every morning. 60 tablet 0  . lithium carbonate (LITHOBID) 300 MG CR tablet Take 2 tablets (600 mg total) by mouth at bedtime. 60 tablet 0  . LORazepam (ATIVAN) 1 MG tablet Take 1 mg by mouth daily as needed for anxiety.   0  . ondansetron (ZOFRAN ODT) 4 MG disintegrating tablet Take 1 tablet (4 mg total) by mouth every 8 (eight) hours as needed for nausea or vomiting. 20 tablet 0  . QUEtiapine (SEROQUEL) 400 MG tablet Take 2 tablets (800 mg total) by mouth at bedtime. 60 tablet 0  . venlafaxine XR (EFFEXOR-XR) 75 MG 24 hr capsule Take 1 capsule (75 mg total) by mouth daily with breakfast. 30 capsule 0   No current facility-administered medications for this visit.    Previous Psychotropic Medications: Yes   Substance Abuse History in the last 12 months:  No.  Consequences of Substance Abuse: Negative  Medical Decision Making:  Established Problem, Worsening (2)  Treatment Plan Summary: daily group therapy    Carolanne GrumblingGerald Eleana Tocco 7/18/201612:08 PM

## 2015-07-18 NOTE — Progress Notes (Signed)
Mackie Pailexandria Champlain is a 25 y.o. , married, unemployed, female, who transitioned [facility].  Pt was inpt (07-06-15 until 07-12-15); treatment for psychosis with self injurious behaviors (cutting self).  Pt reported SI with "many plans." Pt denies previous SI attempts. Pt denies HI. Pt reports visual hallucinations. Pt states that she is seeing flying dogs, cars, a skeleton woman and she does not recognize her face. Pt reports 2 previous hospitalizations at [facility] this month. Reports four total psychiatric hospitalizations.  Pt [facility]. According to the pt, she began partial hospitalization 06/21/15. Pt reports the following diagnoses: Schizoaffective, MDD, PTSD, Dissociative Disorder, and panic attacks. Pt states that she began having severe mental health symptoms 4 years ago. Pt reports decreased sleep and appetite, sadness, no motivation, lack of energy, anhedonia, poor concentration, feelings of worthlessness, hopelessness, helplessness. Pt admits to hx of self-harming. According to the Pt, she would scratch her arms until they bleed. She would also scratch old wounds.  Stressors/Triggers:  1)  Kids.  Pt has an Autistic 546 yr old son and a 603 yr old son.  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  Pt recently enrolled in college [employer]  In-laws are paying daycare for the kids.  2)  Conflictual Relationships with family members.  States she just recently reconnected with her paternal Grandmother.  According to pt she has limited support.  3)  Medical Issues:  Fibromyalgia among multiple other illnesses. Family Hx:  Mom (drugs, anxiety), Father (Bipolar and drugs), P-GM (ETOH and drugs), P-GF (ETOH).  Pt has been seeing Jake SeatsLisa Poulus, NP and Junie BamePaula Katz, [facility] since February 2016.  Childhood:  [place]  Grew up in a home with both addict parents.  Father had Bipolar and mother had struggled with anxiety.  States  parents are hoarders.  Mother would restrict patient's food.  At age 25, pt states she was bullied because she was "dorky." "I was very quiet in school." Siblings:  1 younger brother and 1 older sister. Pt has been married for seven yrs to her supportive husband.  States he is currently filing for power of attorney over pt. Denies drugs/ETOH, legal issues, or DUI's.  Stopped smoking cigarettes in 2009.  Pt completed all forms.  Scored 25 on the burns.  Pt will attend [facility] for ten days.  A:  Oriented pt.  Provided pt with an orientation folder.  Informed Jake SeatsLisa Poulus, NP and [facility], [facility] of [facility].  Encouraged support groups.  R:  Pt receptive.           Chestine SporeLARK, RITA, CNA, M.Ed

## 2015-07-19 ENCOUNTER — Other Ambulatory Visit (HOSPITAL_COMMUNITY): Payer: Self-pay

## 2015-07-19 ENCOUNTER — Other Ambulatory Visit (HOSPITAL_COMMUNITY): Payer: BLUE CROSS/BLUE SHIELD | Admitting: Psychiatry

## 2015-07-19 DIAGNOSIS — F333 Major depressive disorder, recurrent, severe with psychotic symptoms: Secondary | ICD-10-CM

## 2015-07-19 DIAGNOSIS — F315 Bipolar disorder, current episode depressed, severe, with psychotic features: Secondary | ICD-10-CM | POA: Diagnosis not present

## 2015-07-19 NOTE — Progress Notes (Signed)
    Daily Group Progress Note  Program: IOP  Group Time: 9:00-10:30  Participation Level: Active  Behavioral Response: Appropriate  Type of Therapy:  Psycho-education Group  Summary of Progress: Pt. Participated in medication management group with Michelle NasutiElena.     Group Time: 10:30-12:00  Participation Level:  Active  Behavioral Response: Appropriate  Type of Therapy: Group Therapy  Summary of Progress: Pt. Presented with bright affect, talkative. Pt. Participated in discussion about developing self-care behaviors, giving self permission to engage in self-care, and identifying co-dependent behaviors in relationships.   Shaune PollackBrown, Jennifer B, LPC

## 2015-07-20 ENCOUNTER — Other Ambulatory Visit (HOSPITAL_COMMUNITY): Payer: Self-pay

## 2015-07-20 ENCOUNTER — Other Ambulatory Visit (HOSPITAL_COMMUNITY): Payer: BLUE CROSS/BLUE SHIELD | Admitting: Psychiatry

## 2015-07-20 DIAGNOSIS — F333 Major depressive disorder, recurrent, severe with psychotic symptoms: Secondary | ICD-10-CM

## 2015-07-20 DIAGNOSIS — F315 Bipolar disorder, current episode depressed, severe, with psychotic features: Secondary | ICD-10-CM | POA: Diagnosis not present

## 2015-07-20 NOTE — Progress Notes (Signed)
    Daily Group Progress Note  Program: IOP  Group Time: 9:00-10:30  Participation Level: Active  Behavioral Response: Appropriate  Type of Therapy:  Group Therapy  Summary of Progress: Pt. Participated in discussion about childhood trauma, developing pattern of self-kindness and using positive reinforcement to build self-esteem.     Group Time: 10:30-12:00  Participation Level:  Active  Behavioral Response: Appropriate  Type of Therapy: Psycho-education Group  Summary of Progress: Pt. Participated in discussion about use of grounding exercise including bilateral tapping, 5-4-3-2-1, and grounding yoga sequence.   [facility] PSY[facility]

## 2015-07-21 ENCOUNTER — Other Ambulatory Visit (HOSPITAL_COMMUNITY): Payer: BLUE CROSS/BLUE SHIELD | Admitting: Psychiatry

## 2015-07-21 ENCOUNTER — Other Ambulatory Visit (HOSPITAL_COMMUNITY): Payer: Self-pay

## 2015-07-21 DIAGNOSIS — F315 Bipolar disorder, current episode depressed, severe, with psychotic features: Secondary | ICD-10-CM | POA: Diagnosis not present

## 2015-07-21 NOTE — Progress Notes (Signed)
    Daily Group Progress Note  Program: IOP  Group Time:  9:00-10:30  Participation Level: Active  Behavioral Response: Appropriate  Type of Therapy:  Group Therapy  Summary of Progress: Pt. Presented with bright mood, talkative. Pt. Reported that she was experiencing some hip pain. Pt. Was asked What would you do differently if you believed that you were enough? Pt. Discussed her art and that she would do more of her art if she believed she was enough.      Group Time: 10:30-12:00  Participation Level:  Active  Behavioral Response: Appropriate  Type of Therapy: Psycho-education Group  Summary of Progress: Pt. Watched and discussed Shawn Achor video about applying positive psychology with behaviors including meditation, use of gratitude, journaling, physical exercise, and random acts of kindness.   Shaune Pollack, LPC

## 2015-07-21 NOTE — Progress Notes (Signed)
    Daily Group Progress Note  Program: IOP  Group Time: 9:00-10:30  Participation Level: Active  Behavioral Response: Appropriate  Type of Therapy:  Group Therapy  Summary of Progress: Pt. Presents with bright affect, talkative. Pt. Discussed relationship history with husband, learning effective communication patterns in relationships.      Group Time: 10:30-12:00  Participation Level:  Active  Behavioral Response: Appropriate  Type of Therapy: Psycho-education Group  Summary of Progress: Pt. Watched and discussed Clovia Cuff video about self-compassion and application of self-compassion including mindfulness, kindness to self, and common humanity.   Shaune Pollack, LPC

## 2015-07-22 ENCOUNTER — Other Ambulatory Visit (HOSPITAL_COMMUNITY): Payer: BLUE CROSS/BLUE SHIELD | Admitting: Psychiatry

## 2015-07-22 ENCOUNTER — Other Ambulatory Visit (HOSPITAL_COMMUNITY): Payer: Self-pay

## 2015-07-22 DIAGNOSIS — F315 Bipolar disorder, current episode depressed, severe, with psychotic features: Secondary | ICD-10-CM | POA: Diagnosis not present

## 2015-07-22 DIAGNOSIS — F333 Major depressive disorder, recurrent, severe with psychotic symptoms: Secondary | ICD-10-CM

## 2015-07-25 ENCOUNTER — Other Ambulatory Visit (HOSPITAL_COMMUNITY): Payer: BLUE CROSS/BLUE SHIELD | Admitting: Psychiatry

## 2015-07-25 ENCOUNTER — Other Ambulatory Visit (HOSPITAL_COMMUNITY): Payer: Self-pay

## 2015-07-25 DIAGNOSIS — F315 Bipolar disorder, current episode depressed, severe, with psychotic features: Secondary | ICD-10-CM | POA: Diagnosis not present

## 2015-07-25 DIAGNOSIS — F333 Major depressive disorder, recurrent, severe with psychotic symptoms: Secondary | ICD-10-CM

## 2015-07-25 NOTE — Progress Notes (Signed)
    Daily Group Progress Note  Program: IOP  Group Time: 9:00-10:30  Participation Level: Active  Behavioral Response: Appropriate  Type of Therapy:  Psycho-education Group  Summary of Progress: Pt. Participated in medication management group with Michelle Nasuti.     Group Time: 10:30-12:00  Participation Level:  Active  Behavioral Response: Appropriate  Type of Therapy: Group Therapy  Summary of Progress: Pt. Presented as talkative, bright affect. Pt. Discussed concerns about depression with 68 year old son and guilt related to his depression. Pt. Left group early with excused absence.   Shaune Pollack, LPC

## 2015-07-25 NOTE — Progress Notes (Signed)
    Daily Group Progress Note  Program: IOP  Group Time: 9:00-10:30  Participation Level: Active  Behavioral Response: Appropriate  Type of Therapy:  Group Therapy  Summary of Progress: Pt. Reported pain related to fibromialgia. Pt. Presented with bright mood, talkative. Pt. Connected with themes related to forgiveness of parents for emotional neglect and drug abuse. Pt. Connected with previous day's challenge from the group and brought in her art sketches to share with the group.      Group Time: 10:30-12:00  Participation Level:  Active  Behavioral Response: Appropriate  Type of Therapy: Psycho-education Group  Summary of Progress: Pt. Participated in instruction about use of meditation using the RAIN method by Mortimer Fries i.e., recognize, allowing, investigating, and non-identification.  Shaune Pollack, LPC

## 2015-07-26 ENCOUNTER — Other Ambulatory Visit (HOSPITAL_COMMUNITY): Payer: BLUE CROSS/BLUE SHIELD

## 2015-07-26 ENCOUNTER — Other Ambulatory Visit (HOSPITAL_COMMUNITY): Payer: Self-pay

## 2015-07-27 ENCOUNTER — Other Ambulatory Visit (HOSPITAL_COMMUNITY): Payer: Self-pay

## 2015-07-27 ENCOUNTER — Other Ambulatory Visit (HOSPITAL_COMMUNITY): Payer: BLUE CROSS/BLUE SHIELD | Admitting: Psychiatry

## 2015-07-27 DIAGNOSIS — F333 Major depressive disorder, recurrent, severe with psychotic symptoms: Secondary | ICD-10-CM

## 2015-07-27 DIAGNOSIS — F315 Bipolar disorder, current episode depressed, severe, with psychotic features: Secondary | ICD-10-CM | POA: Diagnosis not present

## 2015-07-27 NOTE — Progress Notes (Signed)
    Daily Group Progress Note  Program: IOP  Group Time: 9:00-10:30  Participation Level: Active  Behavioral Response: Appropriate and Monopolizing  Type of Therapy:  Group Therapy  Summary of Progress: Pt. Reported that she was having an asthma attack and did not bring inhaler, was assessed by medical staff. Pt. Reported anticipating grief of cat who has stopped eating. Pt. Discussed plans to go back to school, history of migraine headaches, and challenge of raising autistic child. Pt. Received feedback from group regarding fears of attending a support group.      Group Time: 10:30-12:00  Participation Level:  Active  Behavioral Response: Monopolizing  Type of Therapy: Psycho-education Group  Summary of Progress: Pt. Participated in discussion about recognizing cognitive distortions that disrupt recovery especially tendency toward all-or-nothing thinking.   Shaune Pollack, LPC

## 2015-07-28 ENCOUNTER — Other Ambulatory Visit (HOSPITAL_COMMUNITY): Payer: BLUE CROSS/BLUE SHIELD | Admitting: Psychiatry

## 2015-07-28 ENCOUNTER — Other Ambulatory Visit (HOSPITAL_COMMUNITY): Payer: Self-pay

## 2015-07-28 DIAGNOSIS — F315 Bipolar disorder, current episode depressed, severe, with psychotic features: Secondary | ICD-10-CM | POA: Diagnosis not present

## 2015-07-28 DIAGNOSIS — F333 Major depressive disorder, recurrent, severe with psychotic symptoms: Secondary | ICD-10-CM

## 2015-07-28 NOTE — Progress Notes (Signed)
Patient ID: Teresa Bartlett, female   DOB: [birth year], 25 y.o.   MRN: [id number] Discharge Note  Patient:  Teresa Bartlett is an 25 y.o., female DOB:  July 06, 1990  Date of Admission:  07/18/2015 Date of Discharge:  08/01/2015  Reason for Admission:depression and hallucinations  IOP Course:  Teresa Bartlett attended and participated in groups.  She will be discharged on 1 August and says she is ready for less restrictive setting.  Feels less anxious, less depressed, less overwhelmed.  Has  More optimism and control of her moods.  Still has hallucinations and they were louder this week and on one occasion felt like one of the beings was grabbing her.  Haldol was increased and the hallucinations are at least manageable.  Mental Status at Discharge:depression, anxiety, hallucinations present but much diminished.  No suicidal ideation.  Lab Results: No results found for this or any previous visit (from the past 48 hour(s)).   Current outpatient prescriptions:  .  acetaminophen (TYLENOL) 325 MG tablet, Take 650 mg by mouth every 6 (six) hours as needed for headache., Disp: , Rfl:  .  albuterol (PROVENTIL HFA;VENTOLIN HFA) 108 (90 BASE) MCG/ACT inhaler, Inhale 2 puffs into the lungs every 6 (six) hours as needed for wheezing or shortness of breath., Disp: , Rfl:  .  benztropine (COGENTIN) 1 MG tablet, Take 1 tablet (1 mg total) by mouth 2 (two) times daily., Disp: 60 tablet, Rfl: 0 .  cetaphil (CETAPHIL) lotion, Apply 1 application topically daily. Applies after removing makeup., Disp: , Rfl:  .  flintstones complete (FLINTSTONES) 60 MG chewable tablet, Chew 1 tablet by mouth daily., Disp: , Rfl:  .  haloperidol (HALDOL) 5 MG tablet, Take 1 tablet (5 mg total) by mouth 2 (two) times daily., Disp: 60 tablet, Rfl: 0 .  ibuprofen (ADVIL,MOTRIN) 600 MG tablet, Take 1 tablet (600 mg total) by mouth every 6 (six) hours as needed for mild pain or moderate pain., Disp: 30 tablet, Rfl: 0 .  lithium carbonate  (LITHOBID) 300 MG CR tablet, Take 2 tablets (600 mg total) by mouth every morning., Disp: 60 tablet, Rfl: 0 .  lithium carbonate (LITHOBID) 300 MG CR tablet, Take 2 tablets (600 mg total) by mouth at bedtime., Disp: 60 tablet, Rfl: 0 .  LORazepam (ATIVAN) 1 MG tablet, Take 1 mg by mouth daily as needed for anxiety. , Disp: , Rfl: 0 .  ondansetron (ZOFRAN ODT) 4 MG disintegrating tablet, Take 1 tablet (4 mg total) by mouth every 8 (eight) hours as needed for nausea or vomiting., Disp: 20 tablet, Rfl: 0 .  QUEtiapine (SEROQUEL) 400 MG tablet, Take 2 tablets (800 mg total) by mouth at bedtime., Disp: 60 tablet, Rfl: 0 .  venlafaxine XR (EFFEXOR-XR) 75 MG 24 hr capsule, Take 1 capsule (75 mg total) by mouth daily with breakfast., Disp: 30 capsule, Rfl: 0  Axis Diagnosis:  Bipolar disorder 1, current episode depressed mild, with psychotic thoughts   Level of Care:  IOP  Discharge destination:  Other:  follow up with Dr Napoleon Form, psychiatry and Dr Manson Passey, therapist  Is patient on multiple antipsychotic therapies at discharge:  No    Has Patient had three or more failed trials of antipsychotic monotherapy by history:  Negative  Patient phone:  [phone] (home)  Patient address:   [address]  [place] [address],   Follow-up recommendations:  Activity:  continue current activity Diet:  continue current diet  Comments:  none  The patient received suicide prevention pamphlet:  Yes Belongings returned:    Carolanne Grumbling 07/28/2015, 11:16 AM

## 2015-07-28 NOTE — Progress Notes (Signed)
    Daily Group Progress Note  Program: IOP  Group Time: 9:00-10:30  Participation Level: Active  Behavioral Response: Appropriate  Type of Therapy:  Psycho-education Group  Summary of Progress: Pt. Participated in "six simple words" exercise facilitated by Tawanna Cooler that was intended to assist patients with thinking positively about the recovery process. Pt discussed awareness    Group Time: 10:30-12:00  Participation Level:  Active  Behavioral Response: Appropriate  Type of Therapy: Group Therapy  Summary of Progress: Pt. Continues to present as talkative, bright mood, insightful, needs some redirect for tendency to monopolize. Pt. Is preparing for discharge and requested letter to [facility] in preparation for return to school.   Shaune Pollack, LPC

## 2015-07-29 ENCOUNTER — Other Ambulatory Visit (HOSPITAL_COMMUNITY): Payer: BLUE CROSS/BLUE SHIELD | Admitting: Psychiatry

## 2015-07-29 ENCOUNTER — Other Ambulatory Visit (HOSPITAL_COMMUNITY): Payer: Self-pay

## 2015-07-29 DIAGNOSIS — F315 Bipolar disorder, current episode depressed, severe, with psychotic features: Secondary | ICD-10-CM | POA: Diagnosis not present

## 2015-07-29 DIAGNOSIS — F333 Major depressive disorder, recurrent, severe with psychotic symptoms: Secondary | ICD-10-CM

## 2015-07-29 NOTE — Patient Instructions (Signed)
Patient completed MH-IOP today.  Will follow up with Elana Alm, LCSW on 08-22-15 @ 9 a.m and Dr. Michae Kava on 10-27-15 @ 11:30 a.m.  Encouraged support groups.

## 2015-07-29 NOTE — Progress Notes (Signed)
    Daily Group Progress Note  Program: IOP  Group Time: 9:00-10:30  Participation Level: Active  Behavioral Response: Appropriate  Type of Therapy:  Group Therapy  Summary of Progress: Pt. Prepared for discharge. Pt. Presented with bright affect, smiled and talked appropriately. Pt. Reported readiness for charge. Pt. Complained of feeling very tired and attributed to fibromialgia and chronic fatique. Pt. Reported readiness and enthusiasm for returning to college in the fall.      Group Time: 10:30-12:00  Participation Level:  Pt. Did not attend  Behavioral Response: Pt. Did not attend  Type of Therapy: Psycho-education Group  Summary of Progress: Pt. Was excused from grief and loss group facilitated by Theda Belfast due to appointment with college advisor.   Shaune Pollack, LPC

## 2015-07-29 NOTE — Progress Notes (Signed)
Teresa Bartlett is a 25 y.o. , married, unemployed, female, who transitioned from the inpt unit at [facility]. Pt was inpt (07-06-15 until 07-12-15); treatment for psychosis with self injurious behaviors (cutting self). Pt reported SI with "many plans." Pt denied previous SI attempts. Pt denied HI. Pt reported visual hallucinations. Pt stated that she was seeing flying dogs, cars, a skeleton woman and she does not recognize her face. Pt reported 2 previous hospitalizations at [facility] this month. Reported four total psychiatric hospitalizations. Pt was recently a pt at [facility] partial hospitalization. According to the pt, she began partial hospitalization 06/21/15. Pt reports the following diagnoses: Schizoaffective, MDD, PTSD, Dissociative Disorder, and panic attacks. Pt states that she began having severe mental health symptoms 4 years ago. Pt reports decreased sleep and appetite, sadness, no motivation, lack of energy, anhedonia, poor concentration, feelings of worthlessness, hopelessness, helplessness. Pt admits to hx of self-harming. According to the Pt, she would scratch her arms until they bleed. She would also scratch old wounds. Stressors/Triggers: 1) Kids. Pt has an Autistic 4 yr old son and a 11 yr old son. 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 Pt recently enrolled in college [employer]). In-laws are paying daycare for the kids. 2) Conflictual Relationships with family members. States she just recently reconnected with her paternal Grandmother. According to pt she has limited support. 3) Medical Issues: Fibromyalgia among multiple other illnesses. Pt requesting early discharge due to upcoming knee surgery.  Pt continue to c/o A/Hallucinations.  "They are aggressive voices.  I talk to them now."  Pt denies that they are commanding her to harm self or others.  Denies V/hallucinations.  States that the Haldol is making her feel drowsy.  Reports she informed Dr. Ladona Ridgel  and he suggested taking it all at bedtime.  Pt states she will try that.  Pt reports overall mood improved, decreased anger and sadness.  States that the groups were very helpful.  A:  D/C today.  F/U with Elana Alm, LCSW on 08-22-15 @ 9 a.m and Dr. Michae Kava on 10-27-15 @ 11:30 pm.  Encouraged support groups.  States she and her husband plans to attend the Recovery Group thru [facility].  R:  Pt receptive.     Wendall Isabell, RITA, M.Ed/CNA

## 2015-08-01 ENCOUNTER — Other Ambulatory Visit (HOSPITAL_COMMUNITY): Payer: BLUE CROSS/BLUE SHIELD | Attending: Psychiatry

## 2015-08-01 ENCOUNTER — Other Ambulatory Visit (HOSPITAL_COMMUNITY): Payer: Self-pay

## 2015-08-02 ENCOUNTER — Other Ambulatory Visit (HOSPITAL_COMMUNITY): Payer: BLUE CROSS/BLUE SHIELD

## 2015-08-02 ENCOUNTER — Other Ambulatory Visit (HOSPITAL_COMMUNITY): Payer: Self-pay

## 2015-08-03 ENCOUNTER — Other Ambulatory Visit (HOSPITAL_COMMUNITY): Payer: BLUE CROSS/BLUE SHIELD

## 2015-08-04 ENCOUNTER — Encounter (HOSPITAL_COMMUNITY): Payer: Self-pay | Admitting: Licensed Clinical Social Worker

## 2015-08-04 ENCOUNTER — Other Ambulatory Visit (HOSPITAL_COMMUNITY): Payer: BLUE CROSS/BLUE SHIELD

## 2015-08-05 ENCOUNTER — Other Ambulatory Visit (HOSPITAL_COMMUNITY): Payer: BLUE CROSS/BLUE SHIELD

## 2015-08-08 ENCOUNTER — Other Ambulatory Visit (HOSPITAL_COMMUNITY): Payer: BLUE CROSS/BLUE SHIELD

## 2015-08-09 ENCOUNTER — Other Ambulatory Visit (HOSPITAL_COMMUNITY): Payer: BLUE CROSS/BLUE SHIELD

## 2015-08-09 ENCOUNTER — Ambulatory Visit (HOSPITAL_COMMUNITY): Payer: Self-pay | Admitting: Psychiatry

## 2015-08-10 ENCOUNTER — Other Ambulatory Visit (HOSPITAL_COMMUNITY): Payer: BLUE CROSS/BLUE SHIELD

## 2015-08-11 ENCOUNTER — Other Ambulatory Visit (HOSPITAL_COMMUNITY): Payer: BLUE CROSS/BLUE SHIELD

## 2015-08-12 ENCOUNTER — Other Ambulatory Visit (HOSPITAL_COMMUNITY): Payer: BLUE CROSS/BLUE SHIELD

## 2015-08-15 ENCOUNTER — Other Ambulatory Visit (HOSPITAL_COMMUNITY): Payer: BLUE CROSS/BLUE SHIELD

## 2015-08-16 ENCOUNTER — Other Ambulatory Visit (HOSPITAL_COMMUNITY): Payer: BLUE CROSS/BLUE SHIELD

## 2015-08-17 ENCOUNTER — Other Ambulatory Visit (HOSPITAL_COMMUNITY): Payer: BLUE CROSS/BLUE SHIELD

## 2015-08-18 ENCOUNTER — Other Ambulatory Visit (HOSPITAL_COMMUNITY): Payer: BLUE CROSS/BLUE SHIELD

## 2015-08-19 ENCOUNTER — Other Ambulatory Visit (HOSPITAL_COMMUNITY): Payer: BLUE CROSS/BLUE SHIELD

## 2015-08-21 ENCOUNTER — Encounter (HOSPITAL_COMMUNITY): Payer: Self-pay | Admitting: Nurse Practitioner

## 2015-08-21 ENCOUNTER — Emergency Department (HOSPITAL_COMMUNITY)
Admission: EM | Admit: 2015-08-21 | Discharge: 2015-08-22 | Disposition: A | Discharge status: Home / Self Care | Payer: BLUE CROSS/BLUE SHIELD | Attending: Emergency Medicine | Admitting: Emergency Medicine

## 2015-08-21 DIAGNOSIS — Z8719 Personal history of other diseases of the digestive system: Secondary | ICD-10-CM | POA: Insufficient documentation

## 2015-08-21 DIAGNOSIS — Z87442 Personal history of urinary calculi: Secondary | ICD-10-CM | POA: Diagnosis not present

## 2015-08-21 DIAGNOSIS — Z79899 Other long term (current) drug therapy: Secondary | ICD-10-CM | POA: Diagnosis not present

## 2015-08-21 DIAGNOSIS — Z760 Encounter for issue of repeat prescription: Secondary | ICD-10-CM | POA: Diagnosis present

## 2015-08-21 DIAGNOSIS — D509 Iron deficiency anemia, unspecified: Secondary | ICD-10-CM | POA: Insufficient documentation

## 2015-08-21 DIAGNOSIS — Z8679 Personal history of other diseases of the circulatory system: Secondary | ICD-10-CM | POA: Diagnosis not present

## 2015-08-21 DIAGNOSIS — J45909 Unspecified asthma, uncomplicated: Secondary | ICD-10-CM | POA: Insufficient documentation

## 2015-08-21 DIAGNOSIS — R443 Hallucinations, unspecified: Secondary | ICD-10-CM | POA: Diagnosis not present

## 2015-08-21 DIAGNOSIS — Q6589 Other specified congenital deformities of hip: Secondary | ICD-10-CM | POA: Insufficient documentation

## 2015-08-21 DIAGNOSIS — Z87891 Personal history of nicotine dependence: Secondary | ICD-10-CM | POA: Insufficient documentation

## 2015-08-21 HISTORY — DX: Schizoaffective disorder, bipolar type: F25.0

## 2015-08-21 MED ORDER — QUETIAPINE FUMARATE 100 MG PO TABS
400.0000 mg | ORAL_TABLET | Freq: Every day | ORAL | Status: DC
  Administered 2015-08-21: 400 mg via ORAL
  Filled 2015-08-21 (×4): qty 1

## 2015-08-21 MED ORDER — HALOPERIDOL 5 MG PO TABS
15.0000 mg | ORAL_TABLET | Freq: Every day | ORAL | Status: DC
  Administered 2015-08-21: 15 mg via ORAL
  Filled 2015-08-21: qty 3

## 2015-08-21 MED ORDER — LITHIUM CARBONATE ER 300 MG PO TBCR: 600.0000 mg | EXTENDED_RELEASE_TABLET | Freq: Every morning | ORAL | Status: DC

## 2015-08-21 MED ORDER — LITHIUM CARBONATE ER 300 MG PO TBCR
600.0000 mg | EXTENDED_RELEASE_TABLET | Freq: Once | ORAL | Status: AC
  Administered 2015-08-21: 600 mg via ORAL
  Filled 2015-08-21: qty 2

## 2015-08-21 MED ORDER — QUETIAPINE FUMARATE 300 MG PO TABS
400.0000 mg | ORAL_TABLET | Freq: Every day | ORAL | Status: DC
  Administered 2015-08-21: 400 mg via ORAL

## 2015-08-21 NOTE — ED Notes (Signed)
Pt has a red pocket book with a wallet with flowers, a cell phone, a charger that security has locked up. Pt has in her belonging bag brown flip flops, a grey shirt and black and pink pants.

## 2015-08-21 NOTE — ED Notes (Signed)
Bed: UX32 Expected date:  Expected time:  Means of arrival:  Comments: Hold for tr 8

## 2015-08-21 NOTE — ED Notes (Signed)
Pt states she is in between switching providers and today she realized she will be out of her Lithium 300 mg which she takes total 1200 mg daily. Next appt with new provider is Oct 27 th.

## 2015-08-21 NOTE — ED Notes (Signed)
Patient is requesting the rest of her night time medications.

## 2015-08-21 NOTE — ED Provider Notes (Signed)
CSN: [id number]     Arrival date & time 08/21/15  4098 History  This chart was scribed for non-physician practitioner, Tommy Rainwater, working with Derwood Kaplan, MD by Freida Busman, ED Scribe. This patient was seen in room WTR8/WTR8 and the patient's care was started at 8:08 PM.    Chief Complaint  Patient presents with  . Medication Refill    Lithium 300 mg   The history is provided by the patient. No language interpreter was used.     HPI Comments:  Teresa Bartlett is a 25 y.o. female with a history of schizoaffective disorder who presents to the Emergency Department for a medication refill. Pt states she ran out of lithium today; she did not have her morning dose today. She takes 600 mg BID. Pt has a therapy appointment tomorrow at 9:00 am but notes she experiences her hallucinations the most when at home and  is "borderline suicidal" which she states  is normal for her as her symptoms are not well controlled by her medications. Pt has no physical compaints at this time.   Past Medical History  Diagnosis Date  . Hip dysplasia   . Sacroiliac joint dysfunction of both sides   . IBS (irritable bowel syndrome)   . Normal pregnancy 06/15/2012  . Depression   . Anxiety   . Asthma     "very mild"  . Complication of anesthesia     convulsion with epidural redose  . IDA (iron deficiency anemia)   . Cardiac dysrhythmia, unspecified   . Migraine, unspecified, without mention of intractable migraine without mention of status migrainosus   . Kidney stone   . Fibromyalgia January, 2016  . Schizoaffective disorder, bipolar type    Past Surgical History  Procedure Laterality Date  . Laparoscopic tubal ligation  09/16/2012    Procedure: LAPAROSCOPIC TUBAL LIGATION;  Surgeon: [name], MD;  Location: [facility];  Service: Gynecology;  Laterality: Bilateral;  . Shoulder surgery Right    Family History  Problem Relation Age of Onset  . Diabetes Mother   . Arthritis Mother    . Kidney disease Mother   . Mental illness Mother   . Fibromyalgia Mother   . Migraines Mother   . Osteoporosis Mother   . Drug abuse Mother   . Anxiety disorder Mother   . Heart disease Father   . Diabetes Father   . Hypertension Father   . Hyperlipidemia Father   . Mental illness Father     ptsd and bipolar  . Heart attack Father   . Depression Father   . Bipolar disorder Father   . Drug abuse Father   . Heart disease Paternal Uncle   . Diabetes Maternal Grandmother   . Arthritis Maternal Grandmother   . Cancer Paternal Grandmother     colon  . Hyperlipidemia Paternal Grandmother   . Hypertension Paternal Grandmother   . Alcohol abuse Paternal Grandmother   . Drug abuse Paternal Grandmother   . Diabetes Paternal Grandfather   . Hyperlipidemia Paternal Grandfather   . Hypertension Paternal Grandfather   . Mental illness Paternal Grandfather   . Heart disease Paternal Grandfather   . Alcohol abuse Paternal Grandfather   . Other Neg Hx    Social History  Substance Use Topics  . Smoking status: Former Smoker -- 2 years    Types: Cigarettes    Quit date: 06/05/2008  . Smokeless tobacco: Never Used  . Alcohol Use: Yes  Comment: occasional    OB History    Gravida Para Term Preterm AB TAB SAB Ectopic Multiple Living   2 2 2  0 0 0 0 0 0 2     Review of Systems  Constitutional:       (+) Medication Refill       Allergies  Benadryl; Other; and Saphris  Home Medications   Prior to Admission medications   Medication Sig Start Date End Date Taking? Authorizing Provider  acetaminophen (TYLENOL) 325 MG tablet Take 650 mg by mouth every 6 (six) hours as needed for headache.    Historical Provider, MD  albuterol (PROVENTIL HFA;VENTOLIN HFA) 108 (90 BASE) MCG/ACT inhaler Inhale 2 puffs into the lungs every 6 (six) hours as needed for wheezing or shortness of breath.    Historical Provider, MD  benztropine (COGENTIN) 1 MG tablet Take 1 tablet (1 mg total) by mouth  2 (two) times daily. 07/12/15   Thermon Leyland, NP  cetaphil (CETAPHIL) lotion Apply 1 application topically daily. Applies after removing makeup.    Historical Provider, MD  flintstones complete (FLINTSTONES) 60 MG chewable tablet Chew 1 tablet by mouth daily. 07/12/15   Thermon Leyland, NP  haloperidol (HALDOL) 5 MG tablet Take 1 tablet (5 mg total) by mouth 2 (two) times daily. 07/12/15   Thermon Leyland, NP  ibuprofen (ADVIL,MOTRIN) 600 MG tablet Take 1 tablet (600 mg total) by mouth every 6 (six) hours as needed for mild pain or moderate pain. 07/12/15   Thermon Leyland, NP  lithium carbonate (LITHOBID) 300 MG CR tablet Take 2 tablets (600 mg total) by mouth every morning. 07/12/15   Thermon Leyland, NP  lithium carbonate (LITHOBID) 300 MG CR tablet Take 2 tablets (600 mg total) by mouth at bedtime. 07/12/15   Thermon Leyland, NP  LORazepam (ATIVAN) 1 MG tablet Take 1 mg by mouth daily as needed for anxiety.  04/12/15   Historical Provider, MD  ondansetron (ZOFRAN ODT) 4 MG disintegrating tablet Take 1 tablet (4 mg total) by mouth every 8 (eight) hours as needed for nausea or vomiting. 04/20/15   Francee Piccolo, PA-C  QUEtiapine (SEROQUEL) 400 MG tablet Take 2 tablets (800 mg total) by mouth at bedtime. 07/12/15   Thermon Leyland, NP  venlafaxine XR (EFFEXOR-XR) 75 MG 24 hr capsule Take 1 capsule (75 mg total) by mouth daily with breakfast. 07/12/15   Thermon Leyland, NP   BP 126/85 mmHg  Pulse 88  Temp(Src) 98.3 F (36.8 C) (Oral)  Resp 14  Ht 5\' 4"  (1.626 m)  Wt 244 lb (110.678 kg)  BMI 41.86 kg/m2  SpO2 99%  LMP 03/21/2015 Physical Exam  Constitutional: She is oriented to person, place, and time. She appears well-developed and well-nourished. No distress.  HENT:  Head: Normocephalic and atraumatic.  Neck: No tracheal deviation present.  Cardiovascular: Normal rate.   Pulmonary/Chest: Effort normal.  Abdominal: She exhibits no distension.  Neurological: She is alert and oriented to person, place,  and time.  Skin: Skin is warm and dry.  Psychiatric: Her speech is normal and behavior is normal. Her mood appears anxious.  Nursing note and vitals reviewed.   ED Course  Procedures   DIAGNOSTIC STUDIES:  Oxygen Saturation is 99% on RA, normal by my interpretation.    COORDINATION OF CARE:  8:11 PM Discussed treatment plan with pt at bedside and pt agreed to plan.  Labs Review Labs Reviewed - No data to display  Imaging Review No results found. I have personally reviewed and evaluated these images and lab results as part of my medical decision-making.   EKG Interpretation None      MDM   Final diagnoses:  None    1. Schizoaffective disorder 2. Depression  The patient has stated she does not feel safe going home. She is hallucinating without command voices and reports this is "normal" for her. It worsens when she goes home. She has appointment in place at 9:00 with therapist. Will keep in TCU overnight and plan on discharge in the am at 8:00 to go to her appointment.   I personally performed the services described in this documentation, which was scribed in my presence. The recorded information has been reviewed and is accurate.     Elpidio Anis, PA-C 08/21/15 2035  Derwood Kaplan, MD 08/22/15 1700

## 2015-08-22 ENCOUNTER — Ambulatory Visit (INDEPENDENT_AMBULATORY_CARE_PROVIDER_SITE_OTHER): Payer: BLUE CROSS/BLUE SHIELD | Admitting: Clinical

## 2015-08-22 ENCOUNTER — Encounter (HOSPITAL_COMMUNITY): Payer: Self-pay | Admitting: Clinical

## 2015-08-22 ENCOUNTER — Other Ambulatory Visit (HOSPITAL_COMMUNITY): Payer: BLUE CROSS/BLUE SHIELD

## 2015-08-22 DIAGNOSIS — F4310 Post-traumatic stress disorder, unspecified: Secondary | ICD-10-CM

## 2015-08-22 DIAGNOSIS — F250 Schizoaffective disorder, bipolar type: Secondary | ICD-10-CM

## 2015-08-22 DIAGNOSIS — F508 Other eating disorders: Secondary | ICD-10-CM

## 2015-08-22 DIAGNOSIS — F5081 Binge eating disorder: Secondary | ICD-10-CM

## 2015-08-22 MED ORDER — VENLAFAXINE HCL ER 75 MG PO CP24
75.0000 mg | ORAL_CAPSULE | Freq: Once | ORAL | Status: DC
  Filled 2015-08-22: qty 1

## 2015-08-22 MED ORDER — LITHIUM CARBONATE 300 MG PO TABS: 600.0000 mg | ORAL_TABLET | Freq: Two times a day (BID) | ORAL | Status: DC

## 2015-08-22 MED ORDER — LITHIUM CARBONATE 300 MG PO CAPS: 600.0000 mg | ORAL_CAPSULE | Freq: Once | ORAL | Status: DC
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
## 2015-08-22 NOTE — ED Notes (Signed)
Pt asked for door to be closed. Explained that it must stay open. Pt understood and back to bed.

## 2015-08-22 NOTE — ED Notes (Signed)
Pt resting comfortably with eyes close. Respiration even and unlabored and easily arrousable.

## 2015-08-22 NOTE — Discharge Instructions (Signed)
You can be discharged and need to follow up with your therapist at 9:00 per scheduled appointment.

## 2015-08-22 NOTE — ED Notes (Signed)
Pt resting comfortably with eyes close. Respiration even and unlabored  

## 2015-08-22 NOTE — ED Notes (Signed)
Pt resting comfortably with eyes close. Respiration even and unlabored

## 2015-08-23 ENCOUNTER — Other Ambulatory Visit (HOSPITAL_COMMUNITY): Payer: BLUE CROSS/BLUE SHIELD

## 2015-08-24 ENCOUNTER — Other Ambulatory Visit (HOSPITAL_COMMUNITY): Payer: BLUE CROSS/BLUE SHIELD

## 2015-08-24 ENCOUNTER — Encounter (HOSPITAL_COMMUNITY): Payer: Self-pay | Admitting: Clinical

## 2015-08-25 ENCOUNTER — Other Ambulatory Visit (HOSPITAL_COMMUNITY): Payer: BLUE CROSS/BLUE SHIELD

## 2015-08-26 ENCOUNTER — Other Ambulatory Visit (HOSPITAL_COMMUNITY): Payer: BLUE CROSS/BLUE SHIELD

## 2015-08-29 ENCOUNTER — Other Ambulatory Visit (HOSPITAL_COMMUNITY): Payer: BLUE CROSS/BLUE SHIELD

## 2015-08-30 ENCOUNTER — Other Ambulatory Visit (HOSPITAL_COMMUNITY): Payer: Self-pay | Admitting: Psychiatry

## 2015-08-30 ENCOUNTER — Other Ambulatory Visit (HOSPITAL_COMMUNITY): Payer: BLUE CROSS/BLUE SHIELD

## 2015-08-31 ENCOUNTER — Other Ambulatory Visit (HOSPITAL_COMMUNITY): Payer: BLUE CROSS/BLUE SHIELD

## 2015-09-01 ENCOUNTER — Other Ambulatory Visit (HOSPITAL_COMMUNITY): Payer: BLUE CROSS/BLUE SHIELD

## 2015-09-02 ENCOUNTER — Other Ambulatory Visit (HOSPITAL_COMMUNITY): Payer: BLUE CROSS/BLUE SHIELD

## 2015-09-06 ENCOUNTER — Emergency Department (HOSPITAL_COMMUNITY)
Admission: EM | Admit: 2015-09-06 | Discharge: 2015-09-06 | Disposition: A | Discharge status: Home / Self Care | Payer: BLUE CROSS/BLUE SHIELD | Attending: Emergency Medicine | Admitting: Emergency Medicine

## 2015-09-06 ENCOUNTER — Encounter (HOSPITAL_COMMUNITY): Payer: Self-pay | Admitting: Emergency Medicine

## 2015-09-06 ENCOUNTER — Emergency Department (HOSPITAL_COMMUNITY): Payer: BLUE CROSS/BLUE SHIELD

## 2015-09-06 DIAGNOSIS — J45909 Unspecified asthma, uncomplicated: Secondary | ICD-10-CM | POA: Diagnosis not present

## 2015-09-06 DIAGNOSIS — Y9289 Other specified places as the place of occurrence of the external cause: Secondary | ICD-10-CM | POA: Diagnosis not present

## 2015-09-06 DIAGNOSIS — F250 Schizoaffective disorder, bipolar type: Secondary | ICD-10-CM | POA: Diagnosis not present

## 2015-09-06 DIAGNOSIS — Z8719 Personal history of other diseases of the digestive system: Secondary | ICD-10-CM | POA: Insufficient documentation

## 2015-09-06 DIAGNOSIS — Z8669 Personal history of other diseases of the nervous system and sense organs: Secondary | ICD-10-CM | POA: Diagnosis not present

## 2015-09-06 DIAGNOSIS — Q6589 Other specified congenital deformities of hip: Secondary | ICD-10-CM | POA: Diagnosis not present

## 2015-09-06 DIAGNOSIS — Z87891 Personal history of nicotine dependence: Secondary | ICD-10-CM | POA: Diagnosis not present

## 2015-09-06 DIAGNOSIS — Z8679 Personal history of other diseases of the circulatory system: Secondary | ICD-10-CM | POA: Insufficient documentation

## 2015-09-06 DIAGNOSIS — W1839XA Other fall on same level, initial encounter: Secondary | ICD-10-CM | POA: Diagnosis not present

## 2015-09-06 DIAGNOSIS — Z87442 Personal history of urinary calculi: Secondary | ICD-10-CM | POA: Diagnosis not present

## 2015-09-06 DIAGNOSIS — Z79899 Other long term (current) drug therapy: Secondary | ICD-10-CM | POA: Diagnosis not present

## 2015-09-06 DIAGNOSIS — S93402A Sprain of unspecified ligament of left ankle, initial encounter: Secondary | ICD-10-CM | POA: Insufficient documentation

## 2015-09-06 DIAGNOSIS — S99912A Unspecified injury of left ankle, initial encounter: Secondary | ICD-10-CM | POA: Diagnosis present

## 2015-09-06 DIAGNOSIS — F419 Anxiety disorder, unspecified: Secondary | ICD-10-CM | POA: Diagnosis not present

## 2015-09-06 DIAGNOSIS — Y998 Other external cause status: Secondary | ICD-10-CM | POA: Diagnosis not present

## 2015-09-06 DIAGNOSIS — Z862 Personal history of diseases of the blood and blood-forming organs and certain disorders involving the immune mechanism: Secondary | ICD-10-CM | POA: Diagnosis not present

## 2015-09-06 DIAGNOSIS — Z8739 Personal history of other diseases of the musculoskeletal system and connective tissue: Secondary | ICD-10-CM | POA: Diagnosis not present

## 2015-09-06 DIAGNOSIS — Y9389 Activity, other specified: Secondary | ICD-10-CM | POA: Insufficient documentation

## 2015-09-06 NOTE — Discharge Instructions (Signed)

## 2015-09-06 NOTE — ED Provider Notes (Signed)
CSN: [id number]     Arrival date & time 09/06/15  7425 History  This chart was scribed for Arthor Captain, PA-C working with Benjiman Core, MD by Evon Slack, ED Scribe. This patient was seen in room WTR6/WTR6 and the patient's care was started at 7:04 PM.    Chief Complaint  Patient presents with  . Ankle Pain    l/ankle pain   Patient is a 25 y.o. female presenting with ankle pain. The history is provided by the patient. No language interpreter was used.  Ankle Pain  HPI Comments: Teresa Bartlett is a 25 y.o. female who presents to the Emergency Department complaining of left ankle pain onset today PTA. Pt states that as she was standing up her feet fell asleep and her ankle buckled up under causing her to fall. Pt states she has associated swelling. Pt doesn't report any medications PTA. Pt states that the pain is worse when bearing weight. Pt doesn't report numbness or tingling.    Past Medical History  Diagnosis Date  . Hip dysplasia   . Sacroiliac joint dysfunction of both sides   . IBS (irritable bowel syndrome)   . Normal pregnancy 06/15/2012  . Depression   . Anxiety   . Asthma     "very mild"  . Complication of anesthesia     convulsion with epidural redose  . IDA (iron deficiency anemia)   . Cardiac dysrhythmia, unspecified   . Migraine, unspecified, without mention of intractable migraine without mention of status migrainosus   . Kidney stone   . Fibromyalgia January, 2016  . Schizoaffective disorder, bipolar type    Past Surgical History  Procedure Laterality Date  . Laparoscopic tubal ligation  09/16/2012    Procedure: LAPAROSCOPIC TUBAL LIGATION;  Surgeon: [name], MD;  Location: [facility];  Service: Gynecology;  Laterality: Bilateral;  . Shoulder surgery Right    Family History  Problem Relation Age of Onset  . Diabetes Mother   . Arthritis Mother   . Kidney disease Mother   . Mental illness Mother   . Fibromyalgia Mother   . Migraines  Mother   . Osteoporosis Mother   . Drug abuse Mother   . Anxiety disorder Mother   . Heart disease Father   . Diabetes Father   . Hypertension Father   . Hyperlipidemia Father   . Mental illness Father     ptsd and bipolar  . Heart attack Father   . Depression Father   . Bipolar disorder Father   . Drug abuse Father   . Alcohol abuse Father   . Heart disease Paternal Uncle   . Diabetes Maternal Grandmother   . Arthritis Maternal Grandmother   . Cancer Paternal Grandmother     colon  . Hyperlipidemia Paternal Grandmother   . Hypertension Paternal Grandmother   . Alcohol abuse Paternal Grandmother   . Drug abuse Paternal Grandmother   . Diabetes Paternal Grandfather   . Hyperlipidemia Paternal Grandfather   . Hypertension Paternal Grandfather   . Mental illness Paternal Grandfather   . Heart disease Paternal Grandfather   . Alcohol abuse Paternal Grandfather   . Other Neg Hx    Social History  Substance Use Topics  . Smoking status: Former Smoker -- 2 years    Types: Cigarettes    Quit date: 06/05/2008  . Smokeless tobacco: Never Used  . Alcohol Use: No     Comment: occasional    OB History    Gravida Para  Term Preterm AB TAB SAB Ectopic Multiple Living   2 2 2  0 0 0 0 0 0 2     Review of Systems  Musculoskeletal: Positive for joint swelling and arthralgias.  Neurological: Negative for numbness.  All other systems reviewed and are negative.   Allergies  Benadryl; Other; and Saphris  Home Medications   Prior to Admission medications   Medication Sig Start Date End Date Taking? Authorizing Provider  acetaminophen (TYLENOL) 325 MG tablet Take 650 mg by mouth every 6 (six) hours as needed for headache.    Historical Provider, MD  albuterol (PROVENTIL HFA;VENTOLIN HFA) 108 (90 BASE) MCG/ACT inhaler Inhale 2 puffs into the lungs every 6 (six) hours as needed for wheezing or shortness of breath.    Historical Provider, MD  benztropine (COGENTIN) 1 MG tablet Take 1  tablet (1 mg total) by mouth 2 (two) times daily. Patient not taking: Reported on 08/21/2015 07/12/15   Thermon Leyland, NP  cetaphil (CETAPHIL) lotion Apply 1 application topically daily. Applies after removing makeup.    Historical Provider, MD  cyclobenzaprine (FLEXERIL) 10 MG tablet Take 10 mg by mouth every 8 (eight) hours as needed for muscle spasms.  08/02/15   Historical Provider, MD  flintstones complete (FLINTSTONES) 60 MG chewable tablet Chew 1 tablet by mouth daily. 07/12/15   Thermon Leyland, NP  haloperidol (HALDOL) 5 MG tablet Take 1 tablet (5 mg total) by mouth 2 (two) times daily. Patient taking differently: Take 15 mg by mouth at bedtime.  07/12/15   Thermon Leyland, NP  ibuprofen (ADVIL,MOTRIN) 600 MG tablet Take 1 tablet (600 mg total) by mouth every 6 (six) hours as needed for mild pain or moderate pain. Patient not taking: Reported on 08/21/2015 07/12/15   Thermon Leyland, NP  lithium 300 MG tablet Take 2 tablets (600 mg total) by mouth 2 (two) times daily. 08/22/15   Elpidio Anis, PA-C  LORazepam (ATIVAN) 1 MG tablet Take 1 mg by mouth daily as needed for anxiety.  04/12/15   Historical Provider, MD  naproxen (NAPROSYN) 500 MG tablet Take 500 mg by mouth 2 (two) times daily as needed for mild pain or moderate pain.  08/02/15   Historical Provider, MD  ondansetron (ZOFRAN ODT) 4 MG disintegrating tablet Take 1 tablet (4 mg total) by mouth every 8 (eight) hours as needed for nausea or vomiting. 04/20/15   Francee Piccolo, PA-C  oxyCODONE-acetaminophen (PERCOCET/ROXICET) 5-325 MG per tablet Take 1-2 tablets by mouth every 4 (four) hours as needed for moderate pain or severe pain.  08/02/15   Historical Provider, MD  QUEtiapine (SEROQUEL) 400 MG tablet Take 2 tablets (800 mg total) by mouth at bedtime. 07/12/15   Thermon Leyland, NP  venlafaxine XR (EFFEXOR-XR) 75 MG 24 hr capsule Take 1 capsule (75 mg total) by mouth daily with breakfast. 07/12/15   Thermon Leyland, NP   BP 111/76 mmHg  Pulse 99   Temp(Src) 97.9 F (36.6 C) (Oral)  Wt 245 lb (111.131 kg)  SpO2 100%  LMP 06/01/2015 (Approximate)   Physical Exam  Constitutional: She is oriented to person, place, and time. She appears well-developed and well-nourished. No distress.  HENT:  Head: Normocephalic and atraumatic.  Eyes: Conjunctivae and EOM are normal.  Neck: Neck supple. No tracheal deviation present.  Cardiovascular: Normal rate.   Pulmonary/Chest: Effort normal. No respiratory distress.  Musculoskeletal: Normal range of motion.  Swelling to the lateral malleouls, pulses intact.   Neurological: She  is alert and oriented to person, place, and time.  Skin: Skin is warm and dry.  Psychiatric: She has a normal mood and affect. Her behavior is normal.  Nursing note and vitals reviewed.   ED Course  Procedures (including critical care time) DIAGNOSTIC STUDIES: Oxygen Saturation is 100% on RA, normal by my interpretation.    COORDINATION OF CARE: 7:21 PM-Discussed treatment plan with pt at bedside and pt agreed to plan.     Labs Review Labs Reviewed - No data to display  Imaging Review No results found.   EKG Interpretation None      MDM   Final diagnoses:  Ankle sprain, left, initial encounter   Patient X-Ray negative for obvious fracture or dislocation. Pain managed in ED. Pt advised to follow up with orthopedics if symptoms persist for possibility of missed fracture diagnosis. Patient given brace while in ED, conservative therapy recommended and discussed. Patient will be dc home & is agreeable with above plan.     I personally performed the services described in this documentation, which was scribed in my presence. The recorded information has been reviewed and is accurate.        Arthor Captain, PA-C 09/09/15 1931  Benjiman Core, MD 09/14/15 [phone]

## 2015-09-06 NOTE — ED Notes (Signed)
Assessed by PA. Left ankle pain. Ortho Tech at the bedside.

## 2015-09-06 NOTE — ED Notes (Signed)
Pt stated that she stood up from a crossed- legged position, l/ankle buckled and she heard/felt a popping sensation. Pt walked with crutches to car

## 2015-09-07 ENCOUNTER — Telehealth (HOSPITAL_COMMUNITY): Payer: Self-pay

## 2015-09-07 NOTE — Telephone Encounter (Signed)
Medication management - Left pt a message to call this nurse back after discussing with Dr. Jama Flavors to question if she was in need of a refill of Haldol and to verify her current dosage.  Requested pt call this nurse back as is not scheduled till 10/27/15 with Dr. Michae Kava.

## 2015-09-13 MED ORDER — HALOPERIDOL 5 MG PO TABS: 15.0000 mg | ORAL_TABLET | Freq: Every day | ORAL | Status: DC

## 2015-09-20 ENCOUNTER — Encounter (HOSPITAL_COMMUNITY): Payer: Self-pay | Admitting: Clinical

## 2015-09-20 ENCOUNTER — Ambulatory Visit (INDEPENDENT_AMBULATORY_CARE_PROVIDER_SITE_OTHER): Payer: BLUE CROSS/BLUE SHIELD | Admitting: Clinical

## 2015-09-20 DIAGNOSIS — F250 Schizoaffective disorder, bipolar type: Secondary | ICD-10-CM | POA: Diagnosis not present

## 2015-09-20 DIAGNOSIS — F4310 Post-traumatic stress disorder, unspecified: Secondary | ICD-10-CM | POA: Diagnosis not present

## 2015-09-20 DIAGNOSIS — F508 Other eating disorders: Secondary | ICD-10-CM

## 2015-09-20 DIAGNOSIS — F5081 Binge eating disorder: Secondary | ICD-10-CM

## 2015-09-26 NOTE — Progress Notes (Signed)
   THERAPIST PROGRESS NOTE  Session Time: 9:00 -9:58  Participation Level: Active  Behavioral Response: CasualAlertAnxious  Type of Therapy: Individual Therapy  Treatment Goals addressed:  improve psychiatric symptoms, emotional regulation (being able to concentrate), improved self-esteem (more social), improve unhelpful thought patterns, improve impulse control and manage compulsions  Interventions: CBT and Motivational Interviewing, Grounding and Mindfulness Techniques  Summary: Teresa Bartlett is a 25 y.o. female who presents with schizoaffective disorder, bipolar type and PTSD (post-traumatic stress disorder) and Binge eating disorder  Suicidal/Homicidal: No -without intent/plan  Therapist Response:  Teresa Bartlett met with clinician for an individual session. Teresa Bartlett discussed her psychiatric symptoms, her goals for therapy and  her current life events. Teresa Bartlett shared that she was feeling fairly depressed lately. She shared that she was excited about starting therapy and working on her psychiatric symptoms. Teresa Bartlett asked about her diagnosis and client and client and clinician discussed  her symptoms and diagnosis. Teresa Bartlett shared that her husband suffers from bipolar and that she feels a little jealous that his medication adjustments go so smoothly. She shared that her medication adjustments have not always gone well. She would like to learn more about her mental health diagnosis and ways to improve through therapy. She shared that she would like to be able to control her impulses and compulsions better, that she would like to regulating her emotions and improve her self-esteem. Clinician introduced grounding and mindfulness techniques. Clinician explained the practice, purpose, and application of the techniques. Teresa Bartlett and Architect some of the techniques to gether. Clinician also introduce some basic CBT concepts. Client and clinician discussed how our thoughts  influence our emotions and our actions. Teresa Bartlett agreed to Manufacturing engineer and mindfulness techniques until next session and also complete a CBT packet.  Plan: Return again in 1 -2  weeks.  Diagnosis: Axis I: Schizoaffective disorder, bipolar type and PTSD (post-traumatic stress disorder) and Binge eating disorder      Powell,Frances A, LCSW 09/26/2015

## 2015-09-28 ENCOUNTER — Ambulatory Visit (INDEPENDENT_AMBULATORY_CARE_PROVIDER_SITE_OTHER): Payer: BLUE CROSS/BLUE SHIELD | Admitting: Clinical

## 2015-09-28 ENCOUNTER — Encounter (HOSPITAL_COMMUNITY): Payer: Self-pay | Admitting: Clinical

## 2015-09-28 DIAGNOSIS — F250 Schizoaffective disorder, bipolar type: Secondary | ICD-10-CM | POA: Diagnosis not present

## 2015-09-28 DIAGNOSIS — F4310 Post-traumatic stress disorder, unspecified: Secondary | ICD-10-CM | POA: Diagnosis not present

## 2015-09-28 DIAGNOSIS — F508 Other eating disorders: Secondary | ICD-10-CM | POA: Diagnosis not present

## 2015-09-28 DIAGNOSIS — F5081 Binge eating disorder: Secondary | ICD-10-CM
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
## 2015-10-05 ENCOUNTER — Ambulatory Visit (HOSPITAL_COMMUNITY): Payer: Self-pay | Admitting: Clinical

## 2015-10-06 ENCOUNTER — Encounter (HOSPITAL_COMMUNITY): Payer: Self-pay

## 2015-10-06 ENCOUNTER — Inpatient Hospital Stay (HOSPITAL_COMMUNITY)
Admission: RE | Admit: 2015-10-06 | Discharge: 2015-10-12 | DRG: 885 | Disposition: A | Discharge status: Home / Self Care | Payer: BLUE CROSS/BLUE SHIELD | Attending: Psychiatry | Admitting: Psychiatry

## 2015-10-06 ENCOUNTER — Telehealth (HOSPITAL_COMMUNITY): Payer: Self-pay | Admitting: Psychiatry

## 2015-10-06 DIAGNOSIS — N390 Urinary tract infection, site not specified: Secondary | ICD-10-CM | POA: Clinically undetermined

## 2015-10-06 DIAGNOSIS — Z809 Family history of malignant neoplasm, unspecified: Secondary | ICD-10-CM | POA: Diagnosis not present

## 2015-10-06 DIAGNOSIS — F4312 Post-traumatic stress disorder, chronic: Secondary | ICD-10-CM | POA: Diagnosis present

## 2015-10-06 DIAGNOSIS — F410 Panic disorder [episodic paroxysmal anxiety] without agoraphobia: Secondary | ICD-10-CM | POA: Diagnosis present

## 2015-10-06 DIAGNOSIS — Z62810 Personal history of physical and sexual abuse in childhood: Secondary | ICD-10-CM | POA: Diagnosis present

## 2015-10-06 DIAGNOSIS — Z811 Family history of alcohol abuse and dependence: Secondary | ICD-10-CM | POA: Diagnosis not present

## 2015-10-06 DIAGNOSIS — M797 Fibromyalgia: Secondary | ICD-10-CM | POA: Diagnosis present

## 2015-10-06 DIAGNOSIS — F1190 Opioid use, unspecified, uncomplicated: Secondary | ICD-10-CM | POA: Diagnosis not present

## 2015-10-06 DIAGNOSIS — F3164 Bipolar disorder, current episode mixed, severe, with psychotic features: Secondary | ICD-10-CM | POA: Diagnosis present

## 2015-10-06 DIAGNOSIS — G4700 Insomnia, unspecified: Secondary | ICD-10-CM | POA: Diagnosis present

## 2015-10-06 DIAGNOSIS — Z833 Family history of diabetes mellitus: Secondary | ICD-10-CM

## 2015-10-06 DIAGNOSIS — Z818 Family history of other mental and behavioral disorders: Secondary | ICD-10-CM | POA: Diagnosis not present

## 2015-10-06 DIAGNOSIS — Z8249 Family history of ischemic heart disease and other diseases of the circulatory system: Secondary | ICD-10-CM | POA: Diagnosis not present

## 2015-10-06 DIAGNOSIS — G4710 Hypersomnia, unspecified: Secondary | ICD-10-CM | POA: Diagnosis present

## 2015-10-06 DIAGNOSIS — F329 Major depressive disorder, single episode, unspecified: Secondary | ICD-10-CM | POA: Diagnosis present

## 2015-10-06 DIAGNOSIS — R45851 Suicidal ideations: Secondary | ICD-10-CM | POA: Diagnosis present

## 2015-10-06 DIAGNOSIS — F1121 Opioid dependence, in remission: Secondary | ICD-10-CM | POA: Diagnosis present

## 2015-10-06 MED ORDER — TRAZODONE HCL 50 MG PO TABS: 50.0000 mg | ORAL_TABLET | Freq: Every evening | ORAL | Status: DC | PRN

## 2015-10-06 MED ORDER — ALUM & MAG HYDROXIDE-SIMETH 200-200-20 MG/5ML PO SUSP: 30.0000 mL | ORAL | Status: DC | PRN

## 2015-10-06 MED ORDER — ACETAMINOPHEN 325 MG PO TABS
650.0000 mg | ORAL_TABLET | Freq: Four times a day (QID) | ORAL | Status: DC | PRN
  Administered 2015-10-06 – 2015-10-07 (×2): 650 mg via ORAL
  Filled 2015-10-06 (×2): qty 2

## 2015-10-06 MED ORDER — MAGNESIUM HYDROXIDE 400 MG/5ML PO SUSP: 30.0000 mL | Freq: Every day | ORAL | Status: DC | PRN

## 2015-10-06 MED ORDER — INFLUENZA VAC SPLIT QUAD 0.5 ML IM SUSY
0.5000 mL | PREFILLED_SYRINGE | INTRAMUSCULAR | Status: AC
  Administered 2015-10-07: 0.5 mL via INTRAMUSCULAR
  Filled 2015-10-06: qty 0.5

## 2015-10-06 MED ORDER — QUETIAPINE FUMARATE 400 MG PO TABS
800.0000 mg | ORAL_TABLET | Freq: Every day | ORAL | Status: DC
  Administered 2015-10-06: 800 mg via ORAL
  Filled 2015-10-06 (×2): qty 2
  Filled 2015-10-06: qty 4

## 2015-10-06 NOTE — Progress Notes (Signed)
Psychoeducational Group Note  Date:  10/06/2015 Time:  2322  Group Topic/Focus:  Wrap-Up Group:   The focus of this group is to help patients review their daily goal of treatment and discuss progress on daily workbooks.  Participation Level: Did Not Attend  Participation Quality:  Not Applicable  Affect:  Not Applicable  Cognitive:  Not Applicable  Insight:  Not Applicable  Engagement in Group: Not Applicable  Additional Comments:  The patient refused to attend group this evening and remained on the hallway.   Hazle Coca S 10/06/2015, 11:23 PM

## 2015-10-06 NOTE — Tx Team (Addendum)
Initial Interdisciplinary Treatment Plan   PATIENT STRESSORS: Educational concerns Financial difficulties   PATIENT STRENGTHS: Ability for insight Average or above average intelligence Communication skills Motivation for treatment/growth Supportive family/friends   PROBLEM LIST: Problem List/Patient Goals Date to be addressed Date deferred Reason deferred Estimated date of resolution  "to regulate my medications" 10/06/2015           "To get my mood stable" 10/06/2015           Suicide Ideation 10/06/2015     anxiety      depression      psychosis             DISCHARGE CRITERIA:  Ability to meet basic life and health needs Adequate post-discharge living arrangements Motivation to continue treatment in a less acute level of care Safe-care adequate arrangements made  PRELIMINARY DISCHARGE PLAN: Attend PHP/IOP Return to previous living arrangement Return to previous work or school arrangements  PATIENT/FAMIILY INVOLVEMENT: This treatment plan has been presented to and reviewed with the patient, [place], and/or family member.  The patient and family have been given the opportunity to ask questions and make suggestions.  Mickie Bail 10/06/2015, 6:07 PM
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
## 2015-10-06 NOTE — Progress Notes (Signed)
Patient is a 25 year old caucasian female with walk-in voluntary admission to 500 unit hall for suicidal ideation.  Patient is alert and oriented x 4.  Patient reports having a dissociative symptoms.  Patient states "I don't know where I am at times", "I'm very forgetful"and "not aware of my surroundings."  Patient states She has been having thoughts to overdose on her Xanax.  Patient reports that her husband took the bottle of Xanax away from her.  Reports history of Schizoaffective disorder , Bipolar type.  Endorses auditory hallucination.  Reports hearing lots of rambling voices.  Patient able to contracts for safety at this time.  Reports history of scratching her arms and legs with focus on a particular place until she starts to bleed.  Patient oriented to unit, staff and room.  Admission packet given and reviewed.  Personal belonging searched and placed in locker #23.  Initiated Q 15 minutes checks for safety per protocol.

## 2015-10-06 NOTE — Telephone Encounter (Signed)
D:  Pt's husband Product/process development scientist) Research officer, political party, expressing that pt was having negative thoughts (SI) and needed refills on her medications.  According to husband, he is very worried about pt and needed to know what to do for her.  A:  Instructed him not to leave pt alone and bring her to TTS (walk-in assessment dept) or take her to the nearest ED immediately.  Informed him to give writer a call back if pt isn't admitted, so that Clinical research associate can make sure medications get refilled.  R:  Husband was receptive.

## 2015-10-07 DIAGNOSIS — F1190 Opioid use, unspecified, uncomplicated: Secondary | ICD-10-CM

## 2015-10-07 DIAGNOSIS — R45851 Suicidal ideations: Secondary | ICD-10-CM

## 2015-10-07 DIAGNOSIS — F1121 Opioid dependence, in remission: Secondary | ICD-10-CM | POA: Diagnosis present

## 2015-10-07 DIAGNOSIS — F4312 Post-traumatic stress disorder, chronic: Secondary | ICD-10-CM

## 2015-10-07 DIAGNOSIS — F3164 Bipolar disorder, current episode mixed, severe, with psychotic features: Principal | ICD-10-CM

## 2015-10-07 LAB — URINALYSIS W MICROSCOPIC (NOT AT ARMC)
BILIRUBIN URINE: NEGATIVE
Glucose, UA: NEGATIVE mg/dL
Hgb urine dipstick: NEGATIVE
Ketones, ur: NEGATIVE mg/dL
Nitrite: NEGATIVE
Protein, ur: NEGATIVE mg/dL
SPECIFIC GRAVITY, URINE: 1.023 (ref 1.005–1.030)
UROBILINOGEN UA: 0.2 mg/dL (ref 0.0–1.0)
pH: 6 (ref 5.0–8.0)

## 2015-10-07 LAB — RAPID URINE DRUG SCREEN, HOSP PERFORMED
Amphetamines: NOT DETECTED
Barbiturates: NOT DETECTED
Benzodiazepines: NOT DETECTED
COCAINE: NOT DETECTED
OPIATES: NOT DETECTED
TETRAHYDROCANNABINOL: NOT DETECTED

## 2015-10-07 LAB — PREGNANCY, URINE: Preg Test, Ur: NEGATIVE

## 2015-10-07 MED ORDER — QUETIAPINE FUMARATE 400 MG PO TABS
400.0000 mg | ORAL_TABLET | Freq: Every day | ORAL | Status: DC
  Administered 2015-10-07: 400 mg via ORAL
  Filled 2015-10-07 (×3): qty 1

## 2015-10-07 MED ORDER — OLANZAPINE 10 MG PO TABS: 10.0000 mg | ORAL_TABLET | Freq: Three times a day (TID) | ORAL | Status: DC | PRN

## 2015-10-07 MED ORDER — OLANZAPINE 5 MG PO TBDP
5.0000 mg | ORAL_TABLET | Freq: Four times a day (QID) | ORAL | Status: DC | PRN
  Administered 2015-10-07 – 2015-10-11 (×4): 5 mg via ORAL
  Filled 2015-10-07 (×4): qty 1

## 2015-10-07 MED ORDER — LORAZEPAM 1 MG PO TABS
1.0000 mg | ORAL_TABLET | Freq: Four times a day (QID) | ORAL | Status: DC | PRN
  Administered 2015-10-07: 1 mg via ORAL
  Filled 2015-10-07: qty 1

## 2015-10-07 MED ORDER — LITHIUM CARBONATE 300 MG PO CAPS
600.0000 mg | ORAL_CAPSULE | Freq: Two times a day (BID) | ORAL | Status: DC
  Administered 2015-10-07 – 2015-10-08 (×2): 600 mg via ORAL
  Filled 2015-10-07 (×6): qty 2

## 2015-10-07 MED ORDER — IBUPROFEN 400 MG PO TABS
400.0000 mg | ORAL_TABLET | Freq: Four times a day (QID) | ORAL | Status: DC | PRN
  Administered 2015-10-08 – 2015-10-11 (×7): 400 mg via ORAL
  Filled 2015-10-07 (×8): qty 1

## 2015-10-07 MED ORDER — OLANZAPINE 5 MG PO TABS: 5.0000 mg | ORAL_TABLET | Freq: Four times a day (QID) | ORAL | Status: DC | PRN

## 2015-10-07 MED ORDER — FLUPHENAZINE HCL 5 MG PO TABS
5.0000 mg | ORAL_TABLET | Freq: Every day | ORAL | Status: DC
  Administered 2015-10-07 – 2015-10-08 (×2): 5 mg via ORAL
  Filled 2015-10-07 (×5): qty 1

## 2015-10-07 MED ORDER — ZOLPIDEM TARTRATE 5 MG PO TABS
5.0000 mg | ORAL_TABLET | Freq: Every evening | ORAL | Status: DC | PRN
  Administered 2015-10-07: 5 mg via ORAL
  Filled 2015-10-07: qty 1

## 2015-10-07 MED ORDER — HYDROXYZINE HCL 25 MG PO TABS
25.0000 mg | ORAL_TABLET | ORAL | Status: DC | PRN
  Filled 2015-10-07: qty 1
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
## 2015-10-07 NOTE — Tx Team (Signed)
Interdisciplinary Treatment Plan Update (Adult)  Date:  10/07/2015 Time Reviewed:  12:15 PM  Progress in Treatment: Attending groups: Yes. Participating in groups: Yes. Taking medication as prescribed:  Yes. Tolerating medication:  Yes. Family/Significant other contact made: Yes. Patient understands diagnosis:  Yes, as evidenced by seeking help with AVH and SI. Discussing patient identified problems/goals with staff:  Yes, see initial care plan. Medical problems stabilized or resolved:  Yes Denies suicidal/homicidal ideation: Yes. Issues/concerns per patient self-inventory: No. Other:  New problem(s) identified:   Discharge Plan or Barriers: See below  Reason for Continuation of Hospitalization: Anxiety Depression Hallucinations Medication stabilization Suicidal ideation  Comments: 10/06/15: Teresa Bartlett is an 25 y.o. female who presented to [facility] as a walk [facility]. [facility] and her therapist suggested she come in today. Patient discussed a long history of suicidal ideations and in patient hospitalizations. She was in [facility] in July of this year and had 2 other in patient treatment placements back to back at [place] earlier in the year. Patient has attended partial hospitalization and Intensive Outpatient services with [place] [facility] earlier in the year as well. Prolixin, Lithium, Seroquel  Estimated length of stay: 4-5 days  New goal(s):  Review of initial/current patient goals per problem list:  1. Goal(s): Patient will participate in aftercare plan  Met: No   Target date: at discharge  As evidenced by: Patient will participate within aftercare plan AEB aftercare provider and housing plan at discharge being identified. 10/07/15: Pt will return home and follow-up outpt.   2. Goal (s): Patient will exhibit decreased depressive symptoms and suicidal ideations.  Met:No  Target date: at discharge  As evidenced by: Patient  will utilize self rating of depression at 3 or below and demonstrate decreased signs of depression or be deemed stable for discharge by MD. 10/07/15: Pt rates her depression as 10/10. Will continue to assess.  3. Goal(s): Patient will demonstrate decreased signs and symptoms of anxiety.  Met:No   Target date: at discharge  As evidenced by: Patient will utilize self rating of anxiety at 3 or below and demonstrated decreased signs of anxiety, or be deemed stable for discharge by MD 10/07/15: Pt rates her anxiety as 10/10. Will continue to assess.   4. Goal(s): Patient will demonstrate decreased signs of psychosis.  Met: No  Target date:at discharge  As evidenced by: Patient will demonstrate decreased signs of psychosis as evidenced by a reduction in AVH, paranoia, and/or delusions.   10/07/15: Pt continues to endorse AVH.     Attendees: Patient:  10/07/2015 12:15 PM  Family:   10/07/2015 12:15 PM  Physician:  Dr. Ursula Alert, MD 10/07/2015 12:15 PM  Nursing: Hedy Jacob, RN 10/07/2015 12:15 PM  Case Manager:  Roque Lias, [place] 10/07/2015 12:15 PM  Counselor:  Matthew Saras, MSW Intern 10/07/2015 12:15 PM  Other:   10/07/2015 12:15 PM  Other:   10/07/2015 12:15 PM  Other:   10/07/2015 12:15 PM  Other:  10/07/2015 12:15 PM  Other:    Other:    Other:    Other:    Other:    Other:      Scribe for Treatment Team:   Georga Kaufmann, MSW Intern 10/07/2015 12:15 PM

## 2015-10-07 NOTE — BHH Group Notes (Signed)
[facility] LCSW Aftercare Discharge Planning Group Note   10/07/2015 10:38 AM  Participation Quality:  Engaged  Mood/Affect:  Flat  Depression Rating:  10  Anxiety Rating:  10  Thoughts of Suicide:  Yes Will you contract for safety?   Yes  Current AVH:  Yes  Plan for Discharge/Comments:  "I've been slipping.  I've been hearing negative voices, having hallucinations and and feel like I am losing touch."  States she has been transferred from IOP to outpt therapy, that she has met with Tharon Aquas twice, thinks its a good fit, but does not see the Dr until the end of the month, and has not seen a Dr since July.   Whether this is true or not is yet to be determined, but this is what she is blaming some of her symptoms on, saying that she has not gotten refills for her meds, so has had to go to the ED to get meds.  Affect brightened when talking about her children.  Younger one is being held out of preschool, so home with her.  Older is in first grade and has a good Pharmacist, hospital.  Transportation Means: husband  Supports: husband, family  Teresa Bartlett, Teresa Bartlett

## 2015-10-07 NOTE — Plan of Care (Signed)
Problem: Ineffective individual coping Goal: STG: Patient will remain free from self harm Outcome: Progressing Although patient is endorsing some thoughts to harm self, she has not engaged in doing so and verbally contracts for safety.  Problem: Alteration in mood; excessive anxiety as evidenced by: Goal: STG-Pt can identify coping skills to manage panic/anxiety (Patient can identify at least ____ coping skills to manage panic/anxiety attack)  Outcome: Progressing Patient verbalizing several positive coping skills and is observed using them.
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
## 2015-10-07 NOTE — [facility] Group Notes (Signed)
Pt stated her day was a roller coaster.  She said her new medications are messing with her head.  Her goal was to get medications figured out.   Caroll Rancher, MHT

## 2015-10-07 NOTE — [facility] Suicide Risk Assessment (Signed)
[facility] INPATIENT:  Family/Significant Other Suicide Prevention Education  Suicide Prevention Education:  Education Completed; Jihan Mellette (husband) [phone] has been identified by the patient as the family member/significant other with whom the patient will be residing, and identified as the person(s) who will aid the patient in the event of a mental health crisis (suicidal ideations/suicide attempt).  With written consent from the patient, the family member/significant other has been provided the following suicide prevention education, prior to the and/or following the discharge of the patient.  The suicide prevention education provided includes the following:  Suicide risk factors  Suicide prevention and interventions  National Suicide Hotline telephone number  [facility] assessment telephone number  [facility] Emergency Assistance 911  [facility] and/or Residential Mobile Crisis Unit telephone number  Request made of family/significant other to:  Remove weapons (e.g., guns, rifles, knives), all items previously/currently identified as safety concern.    Remove drugs/medications (over-the-counter, prescriptions, illicit drugs), all items previously/currently identified as a safety concern.  The family member/significant other verbalizes understanding of the suicide prevention education information provided.  The family member/significant other agrees to remove the items of safety concern listed above.  Jonathon Jordan 10/07/2015, 3:28 PM

## 2015-10-07 NOTE — Progress Notes (Addendum)
Met with patient 1:1 this morning. She is anxious both in affect and mood. States she is experiencing AH which she has all of the time however she states they are starting to turn negative in nature. "That's when things get bad. I've thought about hurting myself but I won't I contract for safety." Rating her depression and anxiety both at a 7/10, hopelessness at an 8/10. States her goal is to get back on her meds. Denied pain initially however mid morning indicated she had a toothache as she is awaiting a root canal. Given tylenol for pain. Patient offered emotional support and praised for insight. Flu shot given. Discussed with MD, tx team. On R/A she reports pain has not decreased. Patient denying HI/VH and remains safe. Jamie Kato
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
## 2015-10-07 NOTE — Progress Notes (Signed)
Pt is alert and oriented x 4. Pt endorses auditory command hallucination to harm self. Pt also endorses severe depression, anxiety and increased worrying due to the vices. She states, "I can still hear them asking me to harm myself, I can concentrate in school, I had to drop out; I am going from being very anxious to being very depressed; I am very worried that I would never finish school." Pt can be seen pacing the hallway. Pt however, contacted for safety. Pt also complain of mild headache. Pt continue to be nonaggressive and pleasant.  A: Medications offered as prescribed.  Support, encouragement, and safe environment provided.  15-minute safety checks continue.  R: Pt was med compliant.  Did not attend group. Safety checks continue.

## 2015-10-08 DIAGNOSIS — N390 Urinary tract infection, site not specified: Secondary | ICD-10-CM | POA: Clinically undetermined

## 2015-10-08 LAB — CBC WITH DIFFERENTIAL/PLATELET
Basophils Absolute: 0 10*3/uL (ref 0.0–0.1)
Basophils Relative: 0 %
Eosinophils Absolute: 0.3 10*3/uL (ref 0.0–0.7)
Eosinophils Relative: 5 %
HEMATOCRIT: 36.4 % (ref 36.0–46.0)
HEMOGLOBIN: 12.2 g/dL (ref 12.0–15.0)
LYMPHS ABS: 2.5 10*3/uL (ref 0.7–4.0)
Lymphocytes Relative: 38 %
MCH: 30.7 pg (ref 26.0–34.0)
MCHC: 33.5 g/dL (ref 30.0–36.0)
MCV: 91.5 fL (ref 78.0–100.0)
MONO ABS: 0.7 10*3/uL (ref 0.1–1.0)
MONOS PCT: 11 %
NEUTROS ABS: 3 10*3/uL (ref 1.7–7.7)
NEUTROS PCT: 46 %
Platelets: 269 10*3/uL (ref 150–400)
RBC: 3.98 MIL/uL (ref 3.87–5.11)
RDW: 12.2 % (ref 11.5–15.5)
WBC: 6.5 10*3/uL (ref 4.0–10.5)

## 2015-10-08 LAB — LIPID PANEL
CHOLESTEROL: 181 mg/dL (ref 0–200)
HDL: 46 mg/dL (ref >40)
LDL CALC: 101 mg/dL — AB (ref 0–99)
TRIGLYCERIDES: 169 mg/dL — AB (ref <150)
Total CHOL/HDL Ratio: 3.9 RATIO
VLDL: 34 mg/dL (ref 0–40)

## 2015-10-08 LAB — COMPREHENSIVE METABOLIC PANEL
ALBUMIN: 4.3 g/dL (ref 3.5–5.0)
ALK PHOS: 69 U/L (ref 38–126)
ALT: 18 U/L (ref 14–54)
ANION GAP: 9 (ref 5–15)
AST: 25 U/L (ref 15–41)
BILIRUBIN TOTAL: 0.4 mg/dL (ref 0.3–1.2)
BUN: 12 mg/dL (ref 6–20)
CALCIUM: 9.3 mg/dL (ref 8.9–10.3)
CO2: 23 mmol/L (ref 22–32)
Chloride: 107 mmol/L (ref 101–111)
Creatinine, Ser: 0.64 mg/dL (ref 0.44–1.00)
GLUCOSE: 94 mg/dL (ref 65–99)
POTASSIUM: 4.1 mmol/L (ref 3.5–5.1)
Sodium: 139 mmol/L (ref 135–145)
TOTAL PROTEIN: 7.4 g/dL (ref 6.5–8.1)

## 2015-10-08 LAB — LITHIUM LEVEL: Lithium Lvl: 0.23 mmol/L — ABNORMAL LOW (ref 0.60–1.20)

## 2015-10-08 MED ORDER — QUETIAPINE FUMARATE 100 MG PO TABS
100.0000 mg | ORAL_TABLET | Freq: Every day | ORAL | Status: DC
  Filled 2015-10-08: qty 1

## 2015-10-08 MED ORDER — CHLORDIAZEPOXIDE HCL 25 MG PO CAPS: 25.0000 mg | ORAL_CAPSULE | Freq: Four times a day (QID) | ORAL | Status: DC | PRN

## 2015-10-08 MED ORDER — GABAPENTIN 100 MG PO CAPS
100.0000 mg | ORAL_CAPSULE | Freq: Three times a day (TID) | ORAL | Status: DC
  Administered 2015-10-08 – 2015-10-09 (×4): 100 mg via ORAL
  Filled 2015-10-08 (×9): qty 1

## 2015-10-08 MED ORDER — LITHIUM CARBONATE 300 MG PO CAPS
300.0000 mg | ORAL_CAPSULE | Freq: Two times a day (BID) | ORAL | Status: DC
  Administered 2015-10-08 – 2015-10-12 (×8): 300 mg via ORAL
  Filled 2015-10-08 (×10): qty 1

## 2015-10-08 MED ORDER — NITROFURANTOIN MONOHYD MACRO 100 MG PO CAPS
100.0000 mg | ORAL_CAPSULE | Freq: Two times a day (BID) | ORAL | Status: DC
  Administered 2015-10-08 – 2015-10-12 (×8): 100 mg via ORAL
  Filled 2015-10-08 (×13): qty 1

## 2015-10-08 MED ORDER — TRIHEXYPHENIDYL HCL 5 MG PO TABS
5.0000 mg | ORAL_TABLET | Freq: Two times a day (BID) | ORAL | Status: DC
  Administered 2015-10-08 – 2015-10-12 (×8): 5 mg via ORAL
  Filled 2015-10-08 (×12): qty 1

## 2015-10-08 MED ORDER — ZOLPIDEM TARTRATE 5 MG PO TABS
5.0000 mg | ORAL_TABLET | Freq: Every evening | ORAL | Status: DC | PRN
  Administered 2015-10-08 – 2015-10-09 (×2): 5 mg via ORAL
  Filled 2015-10-08 (×2): qty 1

## 2015-10-08 MED ORDER — FLUPHENAZINE HCL 5 MG PO TABS
5.0000 mg | ORAL_TABLET | Freq: Two times a day (BID) | ORAL | Status: DC
  Administered 2015-10-08 – 2015-10-09 (×2): 5 mg via ORAL
  Filled 2015-10-08 (×6): qty 1

## 2015-10-08 NOTE — Progress Notes (Signed)
Pt is alert and oriented x 4. Pt still endorses auditory command hallucination to harm self. Pt also endorses severe depression, anxiety and increased worrying due to the voices. She states, "my grandfather will be buried tomorrow and I wouldn't be there and I need to get my meds straight so that these voice would leave." Pt can be seen pacing the hallway. Pt however, contacted for safety. Pt denies pain and HI. Pt continues to be nonaggressive and pleasant.  A: Medications offered as prescribed.  Support, encouragement, and safe environment provided.  15-minute safety checks continue.  R: Pt was med compliant.  Pt did attend group. Safety checks continue.

## 2015-10-08 NOTE — Progress Notes (Signed)
[facility] MD Progress Note  10/08/2015 1:21 PM Teresa Bartlett  MRN:  [id number] Subjective: Patient states ' I still feel anxious , I do not know if it is due to one of my medications. I feel like my thoughts are all over the place and cannot focus. I still have Teresa Bartlett of Teresa Bartlett and [place] . They are getting better though.'  Objective;Teresa Bartlett, 25 year old white female , married , has a hx of bipolar disorder , presented with AH as well as mood lability. Patient is known to [facility] , was admitted here recently in July 2016. Pt reports that she could not get an appointment with [facility] sooner and her first appointment is scheduled in October. Pt ran out of some of her medications and had to wait to get it refilled . Pt also ran out of seroquel few days ago and that made her decompensate.   Patient seen and chart reviewed.Discussed patient with treatment team. Pt today reports being anxious and having concentration issues. Pt also with continued AH. Pt reports sleep as better on Ambien. Pt today reports that she has been struggling with mood sx and AH since her teenage years and that the first time she was ever diagnosed with a psychiatric issue was in feb 2016.  Pt continues to report SI , denies plan. Coping techniques like meditation, listening to music helps her. Per staff - pt is compliant on her medications. Will continue to support.     Principal Problem: Bipolar disorder, curr episode mixed, severe, with psychotic features ([place]) versus Schizoaffective disorder , bipolar type  Diagnosis:   Patient Active Problem List   Diagnosis Date Noted  . Urinary tract infection, site not specified [N39.0] 10/08/2015  . Bipolar disorder, curr episode mixed, severe, with psychotic features ([place]) [F31.64] 10/07/2015  . Chronic post-traumatic stress disorder (PTSD) [F43.12] 10/07/2015  . Opioid use disorder, moderate, in sustained remission [F11.90] 10/07/2015  . Kidney stone [N20.0]  04/25/2015  . Fibromyalgia [M79.7] 04/25/2015  . Left wrist pain [M25.532] 11/09/2014  . Fatigue [R53.83] 10/05/2014  . Polyarthralgia [M25.50] 10/05/2014  . Myalgia and myositis [M79.1, M60.9] 10/05/2014  . Vestibular migraine [G43.109] 08/24/2014  . Contact dermatitis [L25.9] 05/10/2014  . Palpitations [R00.2] 03/18/2014  . Benign paroxysmal positional vertigo [H81.10] 03/18/2014  . Right foot pain [M79.671] 11/16/2013  . Insomnia [G47.00] 10/26/2013  . Routine general medical examination at a health care facility [Z00.00] 09/15/2013  . Left ankle injury [S99.912A] 07/29/2013  . Right shoulder pain [M25.511] 07/29/2013  . 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 Normal pregnancy [Z34.90] 06/15/2012   Total Time spent with patient: 30 minutes  Past Psychiatric History: First episode of mental illness - February 2016- reports she had the same symptoms . Pt had atleast 4 admissions [facility] since then and also had 2 ED visits and over night observations . Pt has been diagnosed with MDD, bipolar as well as schizoaffective do. Reports having several SI with plan , but denies attempts.   Past Medical History:  Past Medical History  Diagnosis Date  . Hip dysplasia   . Sacroiliac joint dysfunction of both sides   . IBS (irritable bowel syndrome)   . Normal pregnancy 06/15/2012  . Depression   . Anxiety   . Asthma     "very mild"  . Complication of anesthesia     convulsion with epidural redose  . IDA (iron deficiency anemia)   . Cardiac dysrhythmia, unspecified   . Migraine,  unspecified, without mention of intractable migraine without mention of status migrainosus   . Kidney stone   . Fibromyalgia January, 2016  . Schizoaffective disorder, bipolar type [facility])     Past Surgical History  Procedure Laterality Date  . Laparoscopic tubal ligation  09/16/2012    Procedure: LAPAROSCOPIC TUBAL LIGATION;  Surgeon: Teresa Bores, MD;  Location: [facility];  Service:  Gynecology;  Laterality: Bilateral;  . Shoulder surgery Right    Family History:  Family History  Problem Relation Age of Onset  . Diabetes Mother   . Arthritis Mother   . Kidney disease Mother   . Mental illness Mother   . Fibromyalgia Mother   . Migraines Mother   . Osteoporosis Mother   . Drug abuse Mother   . Anxiety disorder Mother   . Heart disease Father   . Diabetes Father   . Hypertension Father   . Hyperlipidemia Father   . Mental illness Father     ptsd and bipolar  . Heart attack Father   . Depression Father   . Bipolar disorder Father   . Drug abuse Father   . Alcohol abuse Father   . Heart disease Paternal Uncle   . Diabetes Maternal Grandmother   . Arthritis Maternal Grandmother   . Cancer Paternal Grandmother     colon  . Hyperlipidemia Paternal Grandmother   . Hypertension Paternal Grandmother   . Alcohol abuse Paternal Grandmother   . Drug abuse Paternal Grandmother   . Diabetes Paternal Grandfather   . Hyperlipidemia Paternal Grandfather   . Hypertension Paternal Grandfather   . Mental illness Paternal Grandfather   . Heart disease Paternal Grandfather   . Alcohol abuse Paternal Grandfather   . Other Neg Hx    Family Psychiatric  History: Father has a hx of Bipolar disorder. Father attempted suicide .  Social History:  History  Alcohol Use No    Comment: occasional      History  Drug Use No    Social History   Social History  . Marital Status: Married    Spouse Name: N/A  . Number of Children: N/A  . Years of Education: N/A   Social History Main Topics  . Smoking status: Never Smoker   . Smokeless tobacco: Never Used  . Alcohol Use: No     Comment: occasional   . Drug Use: No  . Sexual Activity: Not Asked   Other Topics Concern  . None   Social History Narrative   She is a stay at home mother.   She lives with husband and two sons.   Highest level of education:  Two years of college   Additional Social History:                          Sleep: Fair  Appetite:  improving  Current Medications: Current Facility-Administered Medications  Medication Dose Route Frequency Provider Last Rate Last Dose  . alum & mag hydroxide-simeth (MAALOX/MYLANTA) 200-200-20 MG/5ML suspension 30 mL  30 mL Oral Q4H PRN Kerrie Buffalo, NP      . chlordiazePOXIDE (LIBRIUM) capsule 25 mg  25 mg Oral QID PRN Ursula Alert, MD      . fluPHENAZine (PROLIXIN) tablet 5 mg  5 mg Oral BID Ursula Alert, MD      . gabapentin (NEURONTIN) capsule 100 mg  100 mg Oral TID Ursula Alert, MD   100 mg at 10/08/15 1254  . ibuprofen (ADVIL,MOTRIN)  tablet 400 mg  400 mg Oral Q6H PRN Ursula Alert, MD   400 mg at 10/08/15 0848  . lithium carbonate capsule 300 mg  300 mg Oral BID WC Trina Asch, MD      . magnesium hydroxide (MILK OF MAGNESIA) suspension 30 mL  30 mL Oral Daily PRN Kerrie Buffalo, NP      . nitrofurantoin (macrocrystal-monohydrate) (MACROBID) capsule 100 mg  100 mg Oral Q12H Hiren Peplinski, MD      . OLANZapine zydis (ZYPREXA) disintegrating tablet 5 mg  5 mg Oral Q6H PRN Ursula Alert, MD   5 mg at 10/08/15 0952  . trihexyphenidyl (ARTANE) tablet 5 mg  5 mg Oral BID WC Neema Barreira, MD      . zolpidem (AMBIEN) tablet 5 mg  5 mg Oral QHS PRN,MR X 1 Elward Nocera, MD        Lab Results:  Results for orders placed or performed during the hospital encounter of 10/06/15 (from the past 48 hour(s))  Pregnancy, urine     Status: None   Collection Time: 10/07/15  6:53 PM  Result Value Ref Range   Preg Test, Ur NEGATIVE NEGATIVE    Comment:        THE SENSITIVITY OF THIS METHODOLOGY IS >20 mIU/mL. Performed at [facility]   Urinalysis with microscopic     Status: Abnormal   Collection Time: 10/07/15  6:53 PM  Result Value Ref Range   Color, Urine YELLOW YELLOW   APPearance TURBID (A) CLEAR   Specific Gravity, Urine 1.023 1.005 - 1.030   pH 6.0 5.0 - 8.0   Glucose, UA NEGATIVE NEGATIVE mg/dL    Hgb urine dipstick NEGATIVE NEGATIVE   Bilirubin Urine NEGATIVE NEGATIVE   Ketones, ur NEGATIVE NEGATIVE mg/dL   Protein, ur NEGATIVE NEGATIVE mg/dL   Urobilinogen, UA 0.2 0.0 - 1.0 mg/dL   Nitrite NEGATIVE NEGATIVE   Leukocytes, UA MODERATE (A) NEGATIVE   WBC, UA 21-50 <3 WBC/hpf   Bacteria, UA FEW (A) RARE   Squamous Epithelial / LPF MANY (A) RARE    Comment: Performed at [facility]  Urine rapid drug screen (hosp performed)     Status: None   Collection Time: 10/07/15  6:54 PM  Result Value Ref Range   Opiates NONE DETECTED NONE DETECTED   Cocaine NONE DETECTED NONE DETECTED   Benzodiazepines NONE DETECTED NONE DETECTED   Amphetamines NONE DETECTED NONE DETECTED   Tetrahydrocannabinol NONE DETECTED NONE DETECTED   Barbiturates NONE DETECTED NONE DETECTED    Comment:        DRUG SCREEN FOR MEDICAL PURPOSES ONLY.  IF CONFIRMATION IS NEEDED FOR ANY PURPOSE, NOTIFY LAB WITHIN 5 DAYS.        LOWEST DETECTABLE LIMITS FOR URINE DRUG SCREEN Drug Class       Cutoff (ng/mL) Amphetamine      1000 Barbiturate      200 Benzodiazepine   867 Tricyclics       544 Opiates          300 Cocaine          300 THC              50 Performed at [facility]   Lipid panel     Status: Abnormal   Collection Time: 10/08/15  6:50 AM  Result Value Ref Range   Cholesterol 181 0 - 200 mg/dL   Triglycerides 169 (H) <150 mg/dL   HDL 46 >  40 mg/dL   Total CHOL/HDL Ratio 3.9 RATIO   VLDL 34 0 - 40 mg/dL   LDL Cholesterol 101 (H) 0 - 99 mg/dL    Comment:        Total Cholesterol/HDL:CHD Risk Coronary Heart Disease Risk Table                     Men   Women  1/2 Average Risk   3.4   3.3  Average Risk       5.0   4.4  2 X Average Risk   9.6   7.1  3 X Average Risk  23.4   11.0        Use the calculated Patient Ratio above and the CHD Risk Table to determine the patient's CHD Risk.        ATP III CLASSIFICATION (LDL):  <100     mg/dL   Optimal  100-129   mg/dL   Near or Above                    Optimal  130-159  mg/dL   Borderline  160-189  mg/dL   High  >190     mg/dL   Very High Performed at [facility]   Lithium level     Status: Abnormal   Collection Time: 10/08/15  6:50 AM  Result Value Ref Range   Lithium Lvl 0.23 (L) 0.60 - 1.20 mmol/L    Comment: Performed at [facility]  CBC with Differential/Platelet     Status: None   Collection Time: 10/08/15  6:50 AM  Result Value Ref Range   WBC 6.5 4.0 - 10.5 K/uL   RBC 3.98 3.87 - 5.11 MIL/uL   Hemoglobin 12.2 12.0 - 15.0 g/dL   HCT 36.4 36.0 - 46.0 %   MCV 91.5 78.0 - 100.0 fL   MCH 30.7 26.0 - 34.0 pg   MCHC 33.5 30.0 - 36.0 g/dL   RDW 12.2 11.5 - 15.5 %   Platelets 269 150 - 400 K/uL   Neutrophils Relative % 46 %   Neutro Abs 3.0 1.7 - 7.7 K/uL   Lymphocytes Relative 38 %   Lymphs Abs 2.5 0.7 - 4.0 K/uL   Monocytes Relative 11 %   Monocytes Absolute 0.7 0.1 - 1.0 K/uL   Eosinophils Relative 5 %   Eosinophils Absolute 0.3 0.0 - 0.7 K/uL   Basophils Relative 0 %   Basophils Absolute 0.0 0.0 - 0.1 K/uL    Comment: Performed at [facility]  Comprehensive metabolic panel     Status: None   Collection Time: 10/08/15  6:50 AM  Result Value Ref Range   Sodium 139 135 - 145 mmol/L   Potassium 4.1 3.5 - 5.1 mmol/L   Chloride 107 101 - 111 mmol/L   CO2 23 22 - 32 mmol/L   Glucose, Bld 94 65 - 99 mg/dL   BUN 12 6 - 20 mg/dL   Creatinine, Ser 0.64 0.44 - 1.00 mg/dL   Calcium 9.3 8.9 - 10.3 mg/dL   Total Protein 7.4 6.5 - 8.1 g/dL   Albumin 4.3 3.5 - 5.0 g/dL   AST 25 15 - 41 U/L   ALT 18 14 - 54 U/L   Alkaline Phosphatase 69 38 - 126 U/L   Total Bilirubin 0.4 0.3 - 1.2 mg/dL   GFR calc non Af Amer >60 >60 mL/min   GFR calc Af Amer >60 >  60 mL/min    Comment: (NOTE) The eGFR has been calculated using the CKD EPI equation. This calculation has not been validated in all clinical situations. eGFR's persistently <60 mL/min signify  possible Chronic Kidney Disease.    Anion gap 9 5 - 15    Comment: Performed at [facility]    Physical Findings: AIMS: Facial and Oral Movements Muscles of Facial Expression: None, normal Lips and Perioral Area: None, normal Jaw: None, normal Tongue: None, normal,Extremity Movements Upper (arms, wrists, hands, fingers): None, normal Lower (legs, knees, ankles, toes): None, normal, Trunk Movements Neck, shoulders, hips: None, normal, Overall Severity Severity of abnormal movements (highest score from questions above): None, normal Incapacitation due to abnormal movements: None, normal Patient's awareness of abnormal movements (rate only patient's report): No Awareness, Dental Status Current problems with teeth and/or dentures?: No Does patient usually wear dentures?: No  CIWA:    COWS:     Musculoskeletal: Strength & Muscle Tone: within normal limits Gait & Station: normal Patient leans: N/A  Psychiatric Specialty Exam: Review of Systems  Psychiatric/Behavioral: Positive for suicidal ideas and hallucinations. The patient is nervous/anxious.   All other systems reviewed and are negative.   Blood pressure 107/67, pulse 118, temperature 98.4 F (36.9 C), temperature source Oral, resp. rate 17, height _0  (1.626 m), weight 114.306 kg (252 lb), last menstrual period 06/01/2015, SpO2 100 %.Body mass index is 43.23 kg/(m^2).  General Appearance: Casual  Eye Contact::  Good  Speech:  Clear and Coherent  Volume:  Normal  Mood:  Anxious and Depressed  Affect:  Appropriate  Thought Process:  Linear  Orientation:  Full (Time, Place, and Person)  Thought Content:  Hallucinations: Auditory  Suicidal Thoughts:  Yes.  without intent/plan  Homicidal Thoughts:  No  Memory:  Immediate;   Fair Recent;   Fair Remote;   Fair  Judgement:  Impaired  Insight:  Fair  Psychomotor Activity:  Normal  Concentration:  Poor  Recall:  [employer] of Knowledge:Fair   Language: Fair  Akathisia:  No  Handed:  Right  AIMS (if indicated):     Assets:  Communication Skills Desire for Improvement Physical Health Social Support  ADL's:  Intact  Cognition: WNL  Sleep:  Number of Hours: 6.75   Treatment Plan Summary: Daily contact with patient to assess and evaluate symptoms and progress in treatment and Medication management   Will cross taper Seroquel with Prolixin for psychosis, mood sx. Will continue Cogentin 0.5 mg po daily for EPS. Will continue Lithium 300 mg po bid for mood lability. Li level - subtherapeutic - pt also reports she skipped a few doses. Will continue Ambien 5 mg po qhs prn for sleep. Will add Gabapentin 100 mg po tid for anxiety sx. Pt with paradoxical effect to Vistaril . Will have Zyprexa 10 mg po tid prn for severe anxiety /agitation. Will add Librium 25 mg po qid prn for anxiety/withdrawl sx. Will continue to monitor vitals ,medication compliance and treatment side effects while patient is here.  Will monitor for medical issues as well as call consult as needed.  Reviewed labs , UA - shows wbc- few , leukocytes - moderate - will start Nitrofurantoin 100 mg po bid for recurrent hx of UTI. will get CBC, CMP- WNL , UDS- NEGATIVE  ,urine pregnancy test- NEGATIVE  , lipid panel- Triglycerides elevated , pl level pending  . TSH reviewed - wnl ( 07/07/15) , EKG for QTC since pt is on neuroleptics.  CSW will start working on disposition.  Patient to participate in therapeutic milieu .       Canna Nickelson MD 10/08/2015, 1:21 PM

## 2015-10-08 NOTE — [facility] Group Notes (Signed)
[facility] Group Notes:  (Clinical Social Work)  10/08/2015  11:15-12:00PM  Summary of Progress/Problems:   The main focus of today's process group was to discuss patients' feelings related to being hospitalized, as well as the difference between "being" and "having" a mental health diagnosis.  It was agreed in general by the group that it would be preferable to avoid future hospitalizations, and we discussed means of doing that.  The patient expressed her primary feeling about being hospitalized is not real good because it is the 7th time this year, and she doesn't feel progress is being made on figuring out the right diagnosis or the right medications.  She said her husband has said to focus on herself while he takes care of the children at home; however, she said when she focuses on herself, the voices get much worse.  She was called out to see a Radio broadcast assistant.  Type of Therapy:  Group Therapy - Process  Participation Level:  Active  Participation Quality:  Appropriate, Attentive and Sharing  Affect:  Depressed, Flat and Resistant  Cognitive:  Hallucinating  Insight:  Improving  Engagement in Therapy:  Engaged  Modes of Intervention:  Exploration, Discussion  Ambrose Mantle, LCSW 10/08/2015, 12:30 PM

## 2015-10-08 NOTE — Progress Notes (Signed)
D Ally is upset and crying this evening because she says her husband cannot visit this evening because he doesn't have a babysitter . She was able to  Process this with this nurse...staying sad and dark for several  Minutes but able to articulate her feelings that she knows she will be ok.   A SHe completed her daily assessment and on it she wrote she denied SI and she rated her depression, hopelessness and anxiety " 6/3/7", respectively.    R She states she knows her meds need to be adjusted ...before she will get better. poc cont

## 2015-10-08 NOTE — Progress Notes (Signed)
Adult Psychoeducational Group Note  Date:  10/08/2015 Time:  8:50 PM  Group Topic/Focus:  Wrap-Up Group:   The focus of this group is to help patients review their daily goal of treatment and discuss progress on daily workbooks.  Participation Level:  Active  Participation Quality:  Appropriate  Affect:  Appropriate  Cognitive:  Appropriate  Insight: Appropriate  Engagement in Group:  Engaged  Modes of Intervention:  Discussion  Additional Comments: The patient expressed that she attended group.The patient also said that group was about  Music Therapy.  Octavio Manns 10/08/2015, 8:50 PM

## 2015-10-08 NOTE — Progress Notes (Signed)
D: Patient denies SI/HI and reports auditory hallucinations and states no commands just hears chattering; patient reported anxiety and agitation  A: Monitored q 15 minutes; patient encouraged to attend groups; patient educated about medications; patient given medications per physician orders; patient encouraged to express feelings and/or concerns  R: Patient is cooperative and pleasant; patient calmed down after the prn medication; patient's interaction with staff and peers is appropriate; patient was able to set goal to talk with staff 1:1 when having feelings of SI; patient is taking medications as prescribed and tolerating medications; patient is attending all groups

## 2015-10-09 MED ORDER — FLUPHENAZINE HCL 5 MG PO TABS
7.5000 mg | ORAL_TABLET | Freq: Every evening | ORAL | Status: DC
  Administered 2015-10-09 – 2015-10-11 (×3): 7.5 mg via ORAL
  Filled 2015-10-09 (×4): qty 1

## 2015-10-09 MED ORDER — GABAPENTIN 100 MG PO CAPS
200.0000 mg | ORAL_CAPSULE | Freq: Three times a day (TID) | ORAL | Status: DC
  Administered 2015-10-09 – 2015-10-11 (×6): 200 mg via ORAL
  Filled 2015-10-09 (×8): qty 2

## 2015-10-09 MED ORDER — ZOLPIDEM TARTRATE 10 MG PO TABS
10.0000 mg | ORAL_TABLET | Freq: Every day | ORAL | Status: DC
  Administered 2015-10-09 – 2015-10-10 (×2): 10 mg via ORAL
  Filled 2015-10-09 (×2): qty 1

## 2015-10-09 MED ORDER — OLANZAPINE 10 MG PO TBDP
10.0000 mg | ORAL_TABLET | Freq: Once | ORAL | Status: AC
  Administered 2015-10-09: 10 mg via ORAL
  Filled 2015-10-09 (×2): qty 1

## 2015-10-09 MED ORDER — FLUPHENAZINE HCL 5 MG PO TABS
5.0000 mg | ORAL_TABLET | Freq: Every day | ORAL | Status: DC
  Administered 2015-10-10 – 2015-10-11 (×2): 5 mg via ORAL
  Filled 2015-10-09 (×3): qty 1

## 2015-10-09 NOTE — Progress Notes (Signed)
Patient ID: Teresa Bartlett, female   DOB: Aug 07, 1990, 25 y.o.   MRN: [id number]  Patient came to the writer and asked if we could talk. Patient stated she was "disassociating" and had cut herself with a piece of plastic she found on the floor in the milieu. Patient gave Clinical research associate the piece of plastic and showed writer her left wrist which was superficially cut. Patient's cuts were cleaned and covered and MD Eappen notified. A one time dose of Zydis 10 mg administered, Patient able to contract for safety.

## 2015-10-09 NOTE — Progress Notes (Signed)
D: Pt presents with flat affect and depressed mood. Pt tearful on approach, after speaking with her husband. Pt stated, that her husband is not allowing for her to return back home. Pt stated, that she's been in and out of the hospital because she's noncompliant with taking her meds. Pt stated, that she forgets to take her meds at times. Pt stated that her mental illness and hallucinations often makes it difficult for her to care for her children. Writer discuss with pt, possibility of taking an injectable monthly to help increase med compliance. Writer also discuss possibility of working with an ACT team to help monitor pt mental illness. Pt interested in taking long-acting  Injectable and working with ACT team. Writer encouraged pt to speak with MD and CSW. Pt reported passive SI thoughts. Pt verbally contracts for safety. Pt reported ongoing AH. Pt reported that she do not feel like her meds are effective at this time. A: Medications reviewed with pt. Medications administered as ordered per MD. Verbal support given. Pt encouraged to attend groups. 15 minute checks performed for safety.  R: Pt safety maintained.

## 2015-10-09 NOTE — Progress Notes (Signed)
Adult Psychoeducational Group Note  Date:  10/09/2015 Time:  1030  Group Topic/Focus:  Coping With Mental Health Crisis:   The purpose of this group is to help patients identify strategies for coping with mental health crisis.  Group discusses possible causes of crisis and ways to manage them effectively.  Participation Level:  Active  Participation Quality:  Appropriate  Affect:  Appropriate  Cognitive:  Appropriate  Insight: Appropriate  Engagement in Group:  Engaged  Modes of Intervention:  Discussion and Education  Additional Comments:    Keysha Damewood L 10/09/2015, 11:21 AM

## 2015-10-09 NOTE — Progress Notes (Signed)
[facility] MD Progress Note  10/09/2015 11:59 AM Teresa Bartlett  MRN:  [id number] Subjective: Patient states ' I still feel anxious , I have been talking to my husband , and it looks like he wants me to go to a long term program since I have been getting readmitted very often.'   Objective;[place], 25 year old white female , married , has a hx of bipolar disorder , presented with AH as well as mood lability. Patient is known to [facility] , was admitted here recently in July 2016. Pt reports that she could not get an appointment with [facility] psych associates sooner and her first appointment is scheduled in October. Pt ran out of some of her medications and had to wait to get it refilled . Pt also ran out of seroquel few days ago and that made her decompensate.   Patient seen and chart reviewed.Discussed patient with treatment team. Pt today seen as tearful , labile . Reports that she feels her husband wants her placed at a long term facility. Pt reports being anxious , depressed, having AH as well as SI. Pt denies any plan, and is able to contract for safety. Pt continues to have sleep issues . Reports a higher dose of Ambien might help. Pt advised to make use of coping techniques like meditation, listening to music helps her. Per staff - pt is compliant on her medications. Will continue to support.     Principal Problem: Bipolar disorder, curr episode mixed, severe, with psychotic features ([place]) versus Schizoaffective disorder , bipolar type  Diagnosis:   Patient Active Problem List   Diagnosis Date Noted  . Urinary tract infection, site not specified [N39.0] 10/08/2015  . Bipolar disorder, curr episode mixed, severe, with psychotic features ([place]) [F31.64] 10/07/2015  . Chronic post-traumatic stress disorder (PTSD) [F43.12] 10/07/2015  . Opioid use disorder, moderate, in sustained remission [F11.90] 10/07/2015  . Kidney stone [N20.0] 04/25/2015  . Fibromyalgia [M79.7] 04/25/2015  . Left  wrist pain [M25.532] 11/09/2014  . Fatigue [R53.83] 10/05/2014  . Polyarthralgia [M25.50] 10/05/2014  . Myalgia and myositis [M79.1, M60.9] 10/05/2014  . Vestibular migraine [G43.109] 08/24/2014  . Contact dermatitis [L25.9] 05/10/2014  . Palpitations [R00.2] 03/18/2014  . Benign paroxysmal positional vertigo [H81.10] 03/18/2014  . Right foot pain [M79.671] 11/16/2013  . Insomnia [G47.00] 10/26/2013  . Routine general medical examination at a health care facility [Z00.00] 09/15/2013  . Left ankle injury [S99.912A] 07/29/2013  . Right shoulder pain [M25.511] 07/29/2013  . 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 Normal pregnancy [Z34.90] 06/15/2012   Total Time spent with patient: 30 minutes  Past Psychiatric History: First episode of mental illness - February 2016- reports she had the same symptoms . Pt had atleast 4 admissions at [facility] since then and also had 2 ED visits and over night observations . Pt has been diagnosed with MDD, bipolar as well as schizoaffective do. Reports having several SI with plan , but denies attempts.   Past Medical History:  Past Medical History  Diagnosis Date  . Hip dysplasia   . Sacroiliac joint dysfunction of both sides   . IBS (irritable bowel syndrome)   . Normal pregnancy 06/15/2012  . Depression   . Anxiety   . Asthma     "very mild"  . Complication of anesthesia     convulsion with epidural redose  . IDA (iron deficiency anemia)   . Cardiac dysrhythmia, unspecified   . Migraine, unspecified, without mention of intractable migraine without  mention of status migrainosus   . Kidney stone   . Fibromyalgia January, 2016  . Schizoaffective disorder, bipolar type [facility])     Past Surgical History  Procedure Laterality Date  . Laparoscopic tubal ligation  09/16/2012    Procedure: LAPAROSCOPIC TUBAL LIGATION;  Surgeon: Logan Bores, MD;  Location: [facility];  Service: Gynecology;  Laterality: Bilateral;  . Shoulder surgery  Right    Family History:  Family History  Problem Relation Age of Onset  . Diabetes Mother   . Arthritis Mother   . Kidney disease Mother   . Mental illness Mother   . Fibromyalgia Mother   . Migraines Mother   . Osteoporosis Mother   . Drug abuse Mother   . Anxiety disorder Mother   . Heart disease Father   . Diabetes Father   . Hypertension Father   . Hyperlipidemia Father   . Mental illness Father     ptsd and bipolar  . Heart attack Father   . Depression Father   . Bipolar disorder Father   . Drug abuse Father   . Alcohol abuse Father   . Heart disease Paternal Uncle   . Diabetes Maternal Grandmother   . Arthritis Maternal Grandmother   . Cancer Paternal Grandmother     colon  . Hyperlipidemia Paternal Grandmother   . Hypertension Paternal Grandmother   . Alcohol abuse Paternal Grandmother   . Drug abuse Paternal Grandmother   . Diabetes Paternal Grandfather   . Hyperlipidemia Paternal Grandfather   . Hypertension Paternal Grandfather   . Mental illness Paternal Grandfather   . Heart disease Paternal Grandfather   . Alcohol abuse Paternal Grandfather   . Other Neg Hx    Family Psychiatric  History: Father has a hx of Bipolar disorder. Father attempted suicide .  Social History:  History  Alcohol Use No    Comment: occasional      History  Drug Use No    Social History   Social History  . Marital Status: Married    Spouse Name: N/A  . Number of Children: N/A  . Years of Education: N/A   Social History Main Topics  . Smoking status: Never Smoker   . Smokeless tobacco: Never Used  . Alcohol Use: No     Comment: occasional   . Drug Use: No  . Sexual Activity: Not Asked   Other Topics Concern  . None   Social History Narrative   She is a stay at home mother.   She lives with husband and two sons.   Highest level of education:  Two years of college   Additional Social History:                         Sleep: Poor  Appetite:   improving  Current Medications: Current Facility-Administered Medications  Medication Dose Route Frequency Provider Last Rate Last Dose  . alum & mag hydroxide-simeth (MAALOX/MYLANTA) 200-200-20 MG/5ML suspension 30 mL  30 mL Oral Q4H PRN Kerrie Buffalo, NP      . chlordiazePOXIDE (LIBRIUM) capsule 25 mg  25 mg Oral QID PRN Ursula Alert, MD      . Derrill Memo ON 10/10/2015] fluPHENAZine (PROLIXIN) tablet 5 mg  5 mg Oral Daily Lenford Beddow, MD      . fluPHENAZine (PROLIXIN) tablet 7.5 mg  7.5 mg Oral QPM Harkirat Orozco, MD      . gabapentin (NEURONTIN) capsule 200 mg  200 mg Oral  TID Jomarie Longs, MD      . ibuprofen (ADVIL,MOTRIN) tablet 400 mg  400 mg Oral Q6H PRN Jomarie Longs, MD   400 mg at 10/09/15 1133  . lithium carbonate capsule 300 mg  300 mg Oral BID WC Jomarie Longs, MD   300 mg at 10/09/15 0810  . magnesium hydroxide (MILK OF MAGNESIA) suspension 30 mL  30 mL Oral Daily PRN Adonis Brook, NP      . nitrofurantoin (macrocrystal-monohydrate) (MACROBID) capsule 100 mg  100 mg Oral Q12H Jomarie Longs, MD   100 mg at 10/09/15 0810  . OLANZapine zydis (ZYPREXA) disintegrating tablet 5 mg  5 mg Oral Q6H PRN Jomarie Longs, MD   5 mg at 10/09/15 0910  . trihexyphenidyl (ARTANE) tablet 5 mg  5 mg Oral BID WC Velencia Lenart, MD   5 mg at 10/09/15 0810  . zolpidem (AMBIEN) tablet 10 mg  10 mg Oral QHS Jomarie Longs, MD        Lab Results:  Results for orders placed or performed during the hospital encounter of 10/06/15 (from the past 48 hour(s))  Pregnancy, urine     Status: None   Collection Time: 10/07/15  6:53 PM  Result Value Ref Range   Preg Test, Ur NEGATIVE NEGATIVE    Comment:        THE SENSITIVITY OF THIS METHODOLOGY IS >20 mIU/mL. Performed at [facility]   Urinalysis with microscopic     Status: Abnormal   Collection Time: 10/07/15  6:53 PM  Result Value Ref Range   Color, Urine YELLOW YELLOW   APPearance TURBID (A) CLEAR   Specific Gravity,  Urine 1.023 1.005 - 1.030   pH 6.0 5.0 - 8.0   Glucose, UA NEGATIVE NEGATIVE mg/dL   Hgb urine dipstick NEGATIVE NEGATIVE   Bilirubin Urine NEGATIVE NEGATIVE   Ketones, ur NEGATIVE NEGATIVE mg/dL   Protein, ur NEGATIVE NEGATIVE mg/dL   Urobilinogen, UA 0.2 0.0 - 1.0 mg/dL   Nitrite NEGATIVE NEGATIVE   Leukocytes, UA MODERATE (A) NEGATIVE   WBC, UA 21-50 <3 WBC/hpf   Bacteria, UA FEW (A) RARE   Squamous Epithelial / LPF MANY (A) RARE    Comment: Performed at [facility]  Urine culture     Status: None (Preliminary result)   Collection Time: 10/07/15  6:53 PM  Result Value Ref Range   Specimen Description      URINE, RANDOM Performed at [facility]    Special Requests      Normal Performed at [facility]    Culture      CULTURE REINCUBATED FOR BETTER GROWTH Performed at [facility]    Report Status PENDING   Urine rapid drug screen (hosp performed)     Status: None   Collection Time: 10/07/15  6:54 PM  Result Value Ref Range   Opiates NONE DETECTED NONE DETECTED   Cocaine NONE DETECTED NONE DETECTED   Benzodiazepines NONE DETECTED NONE DETECTED   Amphetamines NONE DETECTED NONE DETECTED   Tetrahydrocannabinol NONE DETECTED NONE DETECTED   Barbiturates NONE DETECTED NONE DETECTED    Comment:        DRUG SCREEN FOR MEDICAL PURPOSES ONLY.  IF CONFIRMATION IS NEEDED FOR ANY PURPOSE, NOTIFY LAB WITHIN 5 DAYS.        LOWEST DETECTABLE LIMITS FOR URINE DRUG SCREEN Drug Class       Cutoff (ng/mL) Amphetamine      1000 Barbiturate  200 Benzodiazepine   956 Tricyclics       387 Opiates          300 Cocaine          300 THC              50 Performed at [facility]   Lipid panel     Status: Abnormal   Collection Time: 10/08/15  6:50 AM  Result Value Ref Range   Cholesterol 181 0 - 200 mg/dL   Triglycerides 169 (H) <150 mg/dL   HDL 46 >40 mg/dL   Total CHOL/HDL Ratio 3.9 RATIO    VLDL 34 0 - 40 mg/dL   LDL Cholesterol 101 (H) 0 - 99 mg/dL    Comment:        Total Cholesterol/HDL:CHD Risk Coronary Heart Disease Risk Table                     Men   Women  1/2 Average Risk   3.4   3.3  Average Risk       5.0   4.4  2 X Average Risk   9.6   7.1  3 X Average Risk  23.4   11.0        Use the calculated Patient Ratio above and the CHD Risk Table to determine the patient's CHD Risk.        ATP III CLASSIFICATION (LDL):  <100     mg/dL   Optimal  100-129  mg/dL   Near or Above                    Optimal  130-159  mg/dL   Borderline  160-189  mg/dL   High  >190     mg/dL   Very High Performed at [facility]   Lithium level     Status: Abnormal   Collection Time: 10/08/15  6:50 AM  Result Value Ref Range   Lithium Lvl 0.23 (L) 0.60 - 1.20 mmol/L    Comment: Performed at [facility]  CBC with Differential/Platelet     Status: None   Collection Time: 10/08/15  6:50 AM  Result Value Ref Range   WBC 6.5 4.0 - 10.5 K/uL   RBC 3.98 3.87 - 5.11 MIL/uL   Hemoglobin 12.2 12.0 - 15.0 g/dL   HCT 36.4 36.0 - 46.0 %   MCV 91.5 78.0 - 100.0 fL   MCH 30.7 26.0 - 34.0 pg   MCHC 33.5 30.0 - 36.0 g/dL   RDW 12.2 11.5 - 15.5 %   Platelets 269 150 - 400 K/uL   Neutrophils Relative % 46 %   Neutro Abs 3.0 1.7 - 7.7 K/uL   Lymphocytes Relative 38 %   Lymphs Abs 2.5 0.7 - 4.0 K/uL   Monocytes Relative 11 %   Monocytes Absolute 0.7 0.1 - 1.0 K/uL   Eosinophils Relative 5 %   Eosinophils Absolute 0.3 0.0 - 0.7 K/uL   Basophils Relative 0 %   Basophils Absolute 0.0 0.0 - 0.1 K/uL    Comment: Performed at [facility]  Comprehensive metabolic panel     Status: None   Collection Time: 10/08/15  6:50 AM  Result Value Ref Range   Sodium 139 135 - 145 mmol/L   Potassium 4.1 3.5 - 5.1 mmol/L   Chloride 107 101 - 111 mmol/L   CO2 23 22 - 32 mmol/L   Glucose, Bld  94 65 - 99 mg/dL   BUN 12 6 - 20 mg/dL   Creatinine, Ser 0.64 0.44 -  1.00 mg/dL   Calcium 9.3 8.9 - 10.3 mg/dL   Total Protein 7.4 6.5 - 8.1 g/dL   Albumin 4.3 3.5 - 5.0 g/dL   AST 25 15 - 41 U/L   ALT 18 14 - 54 U/L   Alkaline Phosphatase 69 38 - 126 U/L   Total Bilirubin 0.4 0.3 - 1.2 mg/dL   GFR calc non Af Amer >60 >60 mL/min   GFR calc Af Amer >60 >60 mL/min    Comment: (NOTE) The eGFR has been calculated using the CKD EPI equation. This calculation has not been validated in all clinical situations. eGFR's persistently <60 mL/min signify possible Chronic Kidney Disease.    Anion gap 9 5 - 15    Comment: Performed at [facility]    Physical Findings: AIMS: Facial and Oral Movements Muscles of Facial Expression: None, normal Lips and Perioral Area: None, normal Jaw: None, normal Tongue: None, normal,Extremity Movements Upper (arms, wrists, hands, fingers): None, normal Lower (legs, knees, ankles, toes): None, normal, Trunk Movements Neck, shoulders, hips: None, normal, Overall Severity Severity of abnormal movements (highest score from questions above): None, normal Incapacitation due to abnormal movements: None, normal Patient's awareness of abnormal movements (rate only patient's report): No Awareness, Dental Status Current problems with teeth and/or dentures?: No Does patient usually wear dentures?: No  CIWA:  CIWA-Ar Total: 2 COWS:     Musculoskeletal: Strength & Muscle Tone: within normal limits Gait & Station: normal Patient leans: N/A  Psychiatric Specialty Exam: Review of Systems  Psychiatric/Behavioral: Positive for suicidal ideas and hallucinations. The patient is nervous/anxious.   All other systems reviewed and are negative.   Blood pressure 107/61, pulse 103, temperature 97.8 F (36.6 C), temperature source Oral, resp. rate 14, height $RemoveBe'5\' 4"'PMoAtthGC$  (1.626 m), weight 114.306 kg (252 lb), last menstrual period 06/01/2015, SpO2 100 %.Body mass index is 43.23 kg/(m^2).  General Appearance: Casual  Eye Contact::   Good  Speech:  Clear and Coherent  Volume:  Normal  Mood:  Anxious and Depressed  Affect:  Tearful  Thought Process:  Linear  Orientation:  Full (Time, Place, and Person)  Thought Content:  Hallucinations: Auditory  Suicidal Thoughts:  Yes.  without intent/plan  Homicidal Thoughts:  No  Memory:  Immediate;   Fair Recent;   Fair Remote;   Fair  Judgement:  Impaired  Insight:  Fair  Psychomotor Activity:  Normal  Concentration:  Poor  Recall:  [employer] of Knowledge:Fair  Language: Fair  Akathisia:  No  Handed:  Right  AIMS (if indicated):   0  Assets:  Communication Skills Desire for Improvement Physical Health Social Support  ADL's:  Intact  Cognition: WNL  Sleep:  Number of Hours: 6.75   Treatment Plan Summary: Daily contact with patient to assess and evaluate symptoms and progress in treatment and Medication management   Will increase Prolixin to 5 mg po qam and 7.5 mg po qpm for psychosis, mood sx. Will continue Artane 5 mg po bid for EPS. Will continue Lithium 300 mg po bid for mood lability. Li level - subtherapeutic -repeat Li level on 10/12/15. Will increase Ambien to 10 mg po qhs prn for sleep. Will increase Gabapentin to 200 mg po tid for anxiety sx. Pt with paradoxical effect to Vistaril . Will have Zyprexa 10 mg po tid prn for severe anxiety /agitation. Will  continue Librium 25 mg po qid prn for anxiety/withdrawl sx. Will continue to monitor vitals ,medication compliance and treatment side effects while patient is here.  Will monitor for medical issues as well as call consult as needed.  Will continue Nitrofurantoin 100 mg po bid for recurrent hx of UTI. will get CBC, CMP- WNL , UDS- NEGATIVE  ,urine pregnancy test- NEGATIVE  , lipid panel- Triglycerides elevated , pl level pending  . TSH reviewed - wnl ( 07/07/15) , EKG for QTC - wnl -since pt is on neuroleptics. CSW will start working on disposition.  Patient to participate in therapeutic milieu .        Orlandria Kissner MD 10/09/2015, 11:59 AM

## 2015-10-09 NOTE — [facility] Group Notes (Signed)
[facility] Group Notes:  (Clinical Social Work)  10/09/2015  [facility] Group Notes:  (Clinical Social Work)  10/09/2015  11:00AM-12:00PM  Summary of Progress/Problems:  The main focus of today's process group was to listen to a variety of genres of music and to identify that different types of music provoke different responses.  The patient then was able to identify personally what was soothing for them, as well as energizing.    The patient expressed understanding of concepts, as well as knowledge of how each type of music affected her and how this can be used at home as a wellness/recovery tool.  Pt was tearful prior to group starting and stated her dominant feeling was sadness, which she rated at a 10 out of 10.  She stated this is because her husband does not want her to come home and she misses her kids.  She cried quite a bit throughout group, later saying she has not cried in a very long time and it was very cathartic.  She rated her sadness at a 4 out of 10 at the end of group.  Type of Therapy:  Music Therapy   Participation Level:  Active  Participation Quality:  Attentive and Sharing  Affect:  Blunted and Depressed and Tearful  Cognitive:  Oriented  Insight:  Engaged  Engagement in Therapy:  Engaged  Modes of Intervention:   Activity, Exploration  Ambrose Mantle, LCSW 10/09/2015

## 2015-10-09 NOTE — Progress Notes (Signed)
Adult Psychoeducational Group Note  Date:  10/09/2015 Time:  8:27 PM  Group Topic/Focus:  Wrap-Up Group:   The focus of this group is to help patients review their daily goal of treatment and discuss progress on daily workbooks.  Participation Level:  Active  Participation Quality:  Appropriate  Affect:  Appropriate  Cognitive:  Appropriate  Insight: Appropriate  Engagement in Group:  Engaged  Modes of Intervention:  Discussion  Additional Comments: The patient expressed that she attended group.The patient also said that Music Therapy really hit home for her.  Octavio Manns 10/09/2015, 8:27 PM

## 2015-10-09 NOTE — Progress Notes (Signed)
Writer spoke with patient 1:1 and she reports having had a difficult afternoon when she found out her husband was unable to visit this evening. She reports that he suggested that she may need to try and go to a long term treatment facility since she has been in and out here so often. Patient is uncertain about her husbands suggestion and Clinical research associate encouraged her to just think about it and talk it over more with him before making a decision. She reports that she has had self harm thoughts but signed a contract with her counselor downstairs and that has helped her not to scratch at herself. She reports passive si and verbally contracts for safety, denies hi and visual hallucinations. Safety maintained on unit with 15 min checks.

## 2015-10-10 LAB — URINE CULTURE: Special Requests: NORMAL

## 2015-10-10 LAB — HEMOGLOBIN A1C
HEMOGLOBIN A1C: 6.1 % — AB (ref 4.8–5.6)
MEAN PLASMA GLUCOSE: 128 mg/dL

## 2015-10-10 LAB — PROLACTIN: Prolactin: 41.4 ng/mL — ABNORMAL HIGH (ref 4.8–23.3)

## 2015-10-10 NOTE — Progress Notes (Signed)
D: Pt presents with flat affect and depressed mood. Pt reports feeling increasingly depressed d/t not receiving support from her husband and not being able to return back home. Pt reported that she plans to move to [place] temporary to live with a friend. Pt reported auditory hallucinations, "chatter". Pt reported passive suicidal thoughts and verbally contracts for safety. Pt c/o of swelling to left ankle from prior [place]. Nicole Kindred, NP., made aware. Pt given ice pack and encouraged to elevate leg. Pt compliant with taking meds. Pt asking for Prolixin to be increased. Np., made aware. Pt compliant with attending groups.  A: Medications administered as ordered per MD.  Verbal support given. Pt encouraged to attend groups. 15 minute checks performed for safety. R: Pt receptive to tx.

## 2015-10-10 NOTE — Progress Notes (Signed)
D:  When asked about there day pt stated, "it was all right". When asked if she had any questions or concerns pt informed the writer that she'd already gone in depth with the previous RN. Writer observed pt in the dayroom with her peers for most of the shift.   A:  Support and encouragement was offered. 15 min checks continued for safety.  R: Pt remains safe.
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
## 2015-10-10 NOTE — Progress Notes (Signed)
Patient ID: Isidor Holts, female   DOB: [birth year], 25 y.o.   MRN: [id number] [facility] MD Progress Note  10/10/2015 10:01 AM Evarose Altland  MRN:  [id number]  Subjective: Crystallee reports that she is feeling a lot better. She feels is connecting with people better. Continue to endorse suicidal ideations, (Fleeeting). Says this is the best she has felt in a while. Denies any issues.   Objective; [place], 25 year old white female, married, has a hx of bipolar disorder , presented with AH as well as mood lability. Patient is known to [facility], was admitted here recently in July 2016. Pt reports that she could not get an appointment with [facility] sooner and her first appointment is scheduled in October. Pt ran out of some of her medications and had to wait to get it refilled . Pt also ran out of seroquel few days ago and that made her decompensate.   Patient seen and chart reviewed.Discussed patient with treatment team. Pt today seen as tearful , labile. Reports that she feels her husband wants her placed at a long term facility. Pt reports being anxious , depressed, having AH as well as SI. Pt denies any plan, and is able to contract for safety. Pt continues to have sleep issues . Reports a higher dose of Ambien might help. Pt advised to make use of coping techniques like meditation, listening to music helps her. Per staff - pt is compliant on her medications. Will continue to support.  Principal Problem: Bipolar disorder, curr episode mixed, severe, with psychotic features (HCC) versus Schizoaffective disorder , bipolar type  Diagnosis:   Patient Active Problem List   Diagnosis Date Noted  . Urinary tract infection, site not specified [N39.0] 10/08/2015  . Bipolar disorder, curr episode mixed, severe, with psychotic features (HCC) [F31.64] 10/07/2015  . Chronic post-traumatic stress disorder (PTSD) [F43.12] 10/07/2015  . Opioid use disorder, moderate, in sustained  remission [F11.90] 10/07/2015  . Kidney stone [N20.0] 04/25/2015  . Fibromyalgia [M79.7] 04/25/2015  . Left wrist pain [M25.532] 11/09/2014  . Fatigue [R53.83] 10/05/2014  . Polyarthralgia [M25.50] 10/05/2014  . Myalgia and myositis [M79.1, M60.9] 10/05/2014  . Vestibular migraine [G43.109] 08/24/2014  . Contact dermatitis [L25.9] 05/10/2014  . Palpitations [R00.2] 03/18/2014  . Benign paroxysmal positional vertigo [H81.10] 03/18/2014  . Right foot pain [M79.671] 11/16/2013  . Insomnia [G47.00] 10/26/2013  . Routine general medical examination at a health care facility [Z00.00] 09/15/2013  . Left ankle injury [S99.912A] 07/29/2013  . Right shoulder pain [M25.511] 07/29/2013  . 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 Normal pregnancy [Z34.90] 06/15/2012   Total Time spent with patient: 15 minutes  Past Psychiatric History: First episode of mental illness - February 2016- reports she had the same symptoms . Pt had atleast 4 admissions at [facility] since then and also had 2 ED visits and over night observations . Pt has been diagnosed with MDD, bipolar as well as schizoaffective do. Reports having several SI with plan , but denies attempts.  Past Medical History:  Past Medical History  Diagnosis Date  . Hip dysplasia   . Sacroiliac joint dysfunction of both sides   . IBS (irritable bowel syndrome)   . Normal pregnancy 06/15/2012  . Depression   . Anxiety   . Asthma     "very mild"  . Complication of anesthesia     convulsion with epidural redose  . IDA (iron deficiency anemia)   . Cardiac dysrhythmia, unspecified   . Migraine,  unspecified, without mention of intractable migraine without mention of status migrainosus   . Kidney stone   . Fibromyalgia January, 2016  . Schizoaffective disorder, bipolar type [facility])     Past Surgical History  Procedure Laterality Date  . Laparoscopic tubal ligation  09/16/2012    Procedure: LAPAROSCOPIC TUBAL LIGATION;  Surgeon:  [name], MD;  Location: [facility];  Service: Gynecology;  Laterality: Bilateral;  . Shoulder surgery Right    Family History:  Family History  Problem Relation Age of Onset  . Diabetes Mother   . Arthritis Mother   . Kidney disease Mother   . Mental illness Mother   . Fibromyalgia Mother   . Migraines Mother   . Osteoporosis Mother   . Drug abuse Mother   . Anxiety disorder Mother   . Heart disease Father   . Diabetes Father   . Hypertension Father   . Hyperlipidemia Father   . Mental illness Father     ptsd and bipolar  . Heart attack Father   . Depression Father   . Bipolar disorder Father   . Drug abuse Father   . Alcohol abuse Father   . Heart disease Paternal Uncle   . Diabetes Maternal Grandmother   . Arthritis Maternal Grandmother   . Cancer Paternal Grandmother     colon  . Hyperlipidemia Paternal Grandmother   . Hypertension Paternal Grandmother   . Alcohol abuse Paternal Grandmother   . Drug abuse Paternal Grandmother   . Diabetes Paternal Grandfather   . Hyperlipidemia Paternal Grandfather   . Hypertension Paternal Grandfather   . Mental illness Paternal Grandfather   . Heart disease Paternal Grandfather   . Alcohol abuse Paternal Grandfather   . Other Neg Hx    Family Psychiatric  History: Father has a hx of Bipolar disorder. Father attempted suicide .  Social History:  History  Alcohol Use No    Comment: occasional      History  Drug Use No    Social History   Social History  . Marital Status: Married    Spouse Name: N/A  . Number of Children: N/A  . Years of Education: N/A   Social History Main Topics  . Smoking status: Never Smoker   . Smokeless tobacco: Never Used  . Alcohol Use: No     Comment: occasional   . Drug Use: No  . Sexual Activity: Not Asked   Other Topics Concern  . None   Social History Narrative   She is a stay at home mother.   She lives with husband and two sons.   Highest level of education:  Two years  of college   Additional Social History:   Sleep: "Improving"  Appetite:  improving  Current Medications: Current Facility-Administered Medications  Medication Dose Route Frequency Provider Last Rate Last Dose  . alum & mag hydroxide-simeth (MAALOX/MYLANTA) 200-200-20 MG/5ML suspension 30 mL  30 mL Oral Q4H PRN Adonis Brook, NP      . chlordiazePOXIDE (LIBRIUM) capsule 25 mg  25 mg Oral QID PRN Jomarie Longs, MD      . fluPHENAZine (PROLIXIN) tablet 5 mg  5 mg Oral Daily Jomarie Longs, MD   5 mg at 10/10/15 0809  . fluPHENAZine (PROLIXIN) tablet 7.5 mg  7.5 mg Oral QPM Saramma Eappen, MD   7.5 mg at 10/09/15 1704  . gabapentin (NEURONTIN) capsule 200 mg  200 mg Oral TID Jomarie Longs, MD   200 mg at 10/10/15 0809  .  ibuprofen (ADVIL,MOTRIN) tablet 400 mg  400 mg Oral Q6H PRN Jomarie Longs, MD   400 mg at 10/10/15 0809  . lithium carbonate capsule 300 mg  300 mg Oral BID WC Jomarie Longs, MD   300 mg at 10/10/15 0809  . magnesium hydroxide (MILK OF MAGNESIA) suspension 30 mL  30 mL Oral Daily PRN Adonis Brook, NP      . nitrofurantoin (macrocrystal-monohydrate) (MACROBID) capsule 100 mg  100 mg Oral Q12H Jomarie Longs, MD   100 mg at 10/10/15 0809  . OLANZapine zydis (ZYPREXA) disintegrating tablet 5 mg  5 mg Oral Q6H PRN Jomarie Longs, MD   5 mg at 10/09/15 0910  . trihexyphenidyl (ARTANE) tablet 5 mg  5 mg Oral BID WC Saramma Eappen, MD   5 mg at 10/10/15 0809  . zolpidem (AMBIEN) tablet 10 mg  10 mg Oral QHS Jomarie Longs, MD   10 mg at 10/09/15 2100   Lab Results:  No results found for this or any previous visit (from the past 48 hour(s)).  Physical Findings: AIMS: Facial and Oral Movements Muscles of Facial Expression: None, normal Lips and Perioral Area: None, normal Jaw: None, normal Tongue: None, normal,Extremity Movements Upper (arms, wrists, hands, fingers): None, normal Lower (legs, knees, ankles, toes): None, normal, Trunk Movements Neck, shoulders, hips:  None, normal, Overall Severity Severity of abnormal movements (highest score from questions above): None, normal Incapacitation due to abnormal movements: None, normal Patient's awareness of abnormal movements (rate only patient's report): No Awareness, Dental Status Current problems with teeth and/or dentures?: No Does patient usually wear dentures?: No  CIWA:  CIWA-Ar Total: 0 COWS:     Musculoskeletal: Strength & Muscle Tone: within normal limits Gait & Station: normal Patient leans: N/A  Psychiatric Specialty Exam: Review of Systems  Psychiatric/Behavioral: Positive for suicidal ideas and hallucinations. Negative for memory loss. The patient is nervous/anxious and has insomnia (Improving).   All other systems reviewed and are negative.   Blood pressure 101/69, pulse 99, temperature 97.9 F (36.6 C), temperature source Oral, resp. rate 18, height 5\' 4"  (1.626 m), weight 114.306 kg (252 lb), last menstrual period 06/01/2015, SpO2 100 %.Body mass index is 43.23 kg/(m^2).  General Appearance: Casual  Eye Contact::  Good  Speech:  Clear and Coherent  Volume:  Normal  Mood:  Anxious and Depressed  Affect:  Tearful  Thought Process:  Linear  Orientation:  Full (Time, Place, and Person)  Thought Content:  Hallucinations: Auditory  Suicidal Thoughts:  Yes.  without intent/plan  Homicidal Thoughts:  No  Memory:  Immediate;   Fair Recent;   Fair Remote;   Fair  Judgement:  Impaired  Insight:  Fair  Psychomotor Activity:  Normal  Concentration:  Poor  Recall:  [employer] of Knowledge:Fair  Language: Fair  Akathisia:  No  Handed:  Right  AIMS (if indicated):   0  Assets:  Communication Skills Desire for Improvement Physical Health Social Support  ADL's:  Intact  Cognition: WNL  Sleep:  Number of Hours: 6.5   Treatment Plan Summary: Daily contact with patient to assess and evaluate symptoms and progress in treatment and Medication management   Will increase Prolixin to 5  mg po qam and 7.5 mg po qpm for psychosis, mood sx. Will continue Artane 5 mg po bid for EPS. Will continue Lithium 300 mg po bid for mood lability. Li level - subtherapeutic -repeat Li level on 10/12/15. Will increase Ambien to 10 mg po qhs prn for  sleep. Will increase Gabapentin to 200 mg po tid for anxiety sx. Pt with paradoxical effect to Vistaril . Will have Zyprexa 10 mg po tid prn for severe anxiety /agitation. Will continue Librium 25 mg po qid prn for anxiety/withdrawl sx. Will continue to monitor vitals ,medication compliance and treatment side effects while patient is here.  Will monitor for medical issues as well as call consult as needed.  Will continue Nitrofurantoin 100 mg po bid for recurrent hx of UTI. will get CBC, CMP- WNL , UDS- NEGATIVE  ,urine pregnancy test- NEGATIVE  , lipid panel- Triglycerides elevated , pl level pending  . TSH reviewed - wnl ( 07/07/15) , EKG for QTC - wnl -since pt is on neuroleptics. CSW will start working on disposition.  Patient to participate in therapeutic milieu .   Sanjuana Kava, PMHNP, FNP-BC 10/10/2015, 10:01 AM I agree with assessment and plan Madie Reno A. Dub Mikes, M.D.

## 2015-10-11 MED ORDER — FLUPHENAZINE HCL 2.5 MG PO TABS
7.5000 mg | ORAL_TABLET | Freq: Every day | ORAL | Status: DC
  Administered 2015-10-12: 7.5 mg via ORAL
  Filled 2015-10-11 (×2): qty 3

## 2015-10-11 MED ORDER — GABAPENTIN 300 MG PO CAPS
300.0000 mg | ORAL_CAPSULE | Freq: Three times a day (TID) | ORAL | Status: DC
  Administered 2015-10-11 – 2015-10-12 (×3): 300 mg via ORAL
  Filled 2015-10-11 (×9): qty 1

## 2015-10-11 NOTE — [facility] Group Notes (Signed)
[facility] Group Notes:  (Nursing/MHT/Case Management/Adjunct)  Date:  10/11/2015  Time:  0930 Type of Therapy:  Nurse Education  Participation Level:  Active  Participation Quality:  Appropriate and Attentive  Affect:  Appropriate and Depressed  Cognitive:  Alert and Appropriate  Insight:  Appropriate and Good  Engagement in Group:  Engaged  Modes of Intervention:  Discussion, Education and Exploration  Summary of Progress/Problems: Group topic was recovery.  Discussed what recovery means to the individual and the importance of medication adherence after discharge.  Patient was attentive and receptive.     Mickie Bail 10/11/2015, 10:38 AM

## 2015-10-11 NOTE — [facility] Group Notes (Signed)
[facility] Group Notes:  (Counselor/Nursing/MHT/Case Management/Adjunct)  10/11/2015 1:15PM  Type of Therapy:  Group Therapy  Participation Level:  Active  Participation Quality:  Appropriate  Affect:  Flat  Cognitive:  Oriented  Insight:  Improving  Engagement in Group:  Limited  Engagement in Therapy:  Limited  Modes of Intervention:  Discussion, Exploration and Socialization  Summary of Progress/Problems: The topic for group was balance in life.  Pt participated in the discussion about when their life was in balance and out of balance and how this feels.  Pt discussed ways to get back in balance and short term goals they can work on to get where they want to be. Engaged throughout.  Stated she switches back and forth on almost an hourly basis about balance vs imbalance, depending on what is going on in her life at the time.  Came across hopeless at this point, but the longer she talked, the more optimistic she became.  "I have always been a survivor."  Went on to enumerate a list of things she does to ground herself and find balance, including an app on her phone, coloring mandalas, positive affirmations and listening to heavy metal.   Teresa Bartlett 10/11/2015 1:33 PM

## 2015-10-11 NOTE — Progress Notes (Signed)
   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 Stated, "but it ended up in a good spot". Pt states her husband wants her to go to long term treatment. States that she may " stay thru Wednesday", which would assure her a more successful transition. However, pt states she's not sure if she wants to go to long term. Pt voiced no questions or concerns.  A:  Support and encouragement was offered. 15 min checks continued for safety.  R: Pt remains safe.

## 2015-10-11 NOTE — [facility] Group Notes (Signed)
Pt stated she met her goal for today and feels pretty good. Pt stated she did not have a request for anything this evening. Jemimah Cressy Cathey MHT

## 2015-10-11 NOTE — Progress Notes (Signed)
D: Pt presents anxious on approach. Pt reported having thoughts to cut her left wrist. Pt requested to have her wrist wrapped with a gauze. Writer wrapped pt left wrist at her request. Pt have not made any cuts at this time. Pt also uses two stress balls to help cope. Pt increasingly depressed and anxious because her husband is not allowing for her to return back home. Pt stated that her other d/c option failed because her friend is not allowing for her to come and stay with her in [place]. Pt endorses AH "chatter". Pt endorses passive SI and verbally contracts for safety.  A: Medications reviewed with  Pt. Medications administered as ordered per MD. Verbal support given. Pt encouraged to continue attending groups. Pt encouraged to report self harm thoughts. 15 minute checks performed for safety.  R: Pt receptive to tx.

## 2015-10-11 NOTE — Progress Notes (Signed)
Patient ID: Isidor Holts, female   DOB: January 05, 1990, 25 y.o.   MRN: [id number] [facility] MD Progress Note  10/11/2015 12:56 PM Tymesha Ditmore  MRN:  [id number]  Subjective: Starkeisha reports that she is feeling a lot better. However she is currently distressed about her husband asking her not to return home to him and to go somewhere else.   Objective; Campbell, 25 year old white female, married, has a hx of bipolar disorder , presented with AH as well as mood lability. Patient is known to [facility], was admitted here recently in July 2016. Pt reports that she could not get an appointment with [facility] sooner and her first appointment is scheduled in October. Pt ran out of some of her medications and had to wait to get it refilled . Pt also ran out of seroquel few days ago and that made her decompensate.   Patient seen and chart reviewed.Discussed patient with treatment team. Pt today seen as tearful , labile. She is distressed about her husband who feels she should not return home. Pt reports having no family other than a grandmother who herself may be mentally ill , on xanax , and she believes it may not be a good place for her to go to. Pt continues to struggle with SI - denies any plan. She is compliant on her medications and is agreeable to taking an LAI. Per staff - pt is compliant on her medications. Will continue to support.  Principal Problem: Bipolar disorder, curr episode mixed, severe, with psychotic features (HCC) versus Schizoaffective disorder , bipolar type  Diagnosis:   Patient Active Problem List   Diagnosis Date Noted  . Urinary tract infection, site not specified [N39.0] 10/08/2015  . Bipolar disorder, curr episode mixed, severe, with psychotic features (HCC) [F31.64] 10/07/2015  . Chronic post-traumatic stress disorder (PTSD) [F43.12] 10/07/2015  . Opioid use disorder, moderate, in sustained remission [F11.90] 10/07/2015  . Kidney stone [N20.0]  04/25/2015  . Fibromyalgia [M79.7] 04/25/2015  . Left wrist pain [M25.532] 11/09/2014  . Fatigue [R53.83] 10/05/2014  . Polyarthralgia [M25.50] 10/05/2014  . Myalgia and myositis [M79.1, M60.9] 10/05/2014  . Vestibular migraine [G43.109] 08/24/2014  . Contact dermatitis [L25.9] 05/10/2014  . Palpitations [R00.2] 03/18/2014  . Benign paroxysmal positional vertigo [H81.10] 03/18/2014  . Right foot pain [M79.671] 11/16/2013  . Insomnia [G47.00] 10/26/2013  . Routine general medical examination at a health care facility [Z00.00] 09/15/2013  . Left ankle injury [S99.912A] 07/29/2013  . Right shoulder pain [M25.511] 07/29/2013  . 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 Normal pregnancy [Z34.90] 06/15/2012   Total Time spent with patient: 25 MINUTES  Past Psychiatric History: First episode of mental illness - February 2016- reports she had the same symptoms . Pt had atleast 4 admissions at [facility] since then and also had 2 ED visits and over night observations . Pt has been diagnosed with MDD, bipolar as well as schizoaffective do. Reports having several SI with plan , but denies attempts.  Past Medical History:  Past Medical History  Diagnosis Date  . Hip dysplasia   . Sacroiliac joint dysfunction of both sides   . IBS (irritable bowel syndrome)   . Normal pregnancy 06/15/2012  . Depression   . Anxiety   . Asthma     "very mild"  . Complication of anesthesia     convulsion with epidural redose  . IDA (iron deficiency anemia)   . Cardiac dysrhythmia, unspecified   . Migraine, unspecified, without mention  of intractable migraine without mention of status migrainosus   . Kidney stone   . Fibromyalgia January, 2016  . Schizoaffective disorder, bipolar type [facility])     Past Surgical History  Procedure Laterality Date  . Laparoscopic tubal ligation  09/16/2012    Procedure: LAPAROSCOPIC TUBAL LIGATION;  Surgeon: [name], MD;  Location: [facility];  Service:  Gynecology;  Laterality: Bilateral;  . Shoulder surgery Right    Family History:  Family History  Problem Relation Age of Onset  . Diabetes Mother   . Arthritis Mother   . Kidney disease Mother   . Mental illness Mother   . Fibromyalgia Mother   . Migraines Mother   . Osteoporosis Mother   . Drug abuse Mother   . Anxiety disorder Mother   . Heart disease Father   . Diabetes Father   . Hypertension Father   . Hyperlipidemia Father   . Mental illness Father     ptsd and bipolar  . Heart attack Father   . Depression Father   . Bipolar disorder Father   . Drug abuse Father   . Alcohol abuse Father   . Heart disease Paternal Uncle   . Diabetes Maternal Grandmother   . Arthritis Maternal Grandmother   . Cancer Paternal Grandmother     colon  . Hyperlipidemia Paternal Grandmother   . Hypertension Paternal Grandmother   . Alcohol abuse Paternal Grandmother   . Drug abuse Paternal Grandmother   . Diabetes Paternal Grandfather   . Hyperlipidemia Paternal Grandfather   . Hypertension Paternal Grandfather   . Mental illness Paternal Grandfather   . Heart disease Paternal Grandfather   . Alcohol abuse Paternal Grandfather   . Other Neg Hx    Family Psychiatric  History: Father has a hx of Bipolar disorder. Father attempted suicide .  Social History:  History  Alcohol Use No    Comment: occasional      History  Drug Use No    Social History   Social History  . Marital Status: Married    Spouse Name: N/A  . Number of Children: N/A  . Years of Education: N/A   Social History Main Topics  . Smoking status: Never Smoker   . Smokeless tobacco: Never Used  . Alcohol Use: No     Comment: occasional   . Drug Use: No  . Sexual Activity: Not Asked   Other Topics Concern  . None   Social History Narrative   She is a stay at home mother.   She lives with husband and two sons.   Highest level of education:  Two years of college   Additional Social History:   Sleep:  "Improving"  Appetite:  improving  Current Medications: Current Facility-Administered Medications  Medication Dose Route Frequency Provider Last Rate Last Dose  . alum & mag hydroxide-simeth (MAALOX/MYLANTA) 200-200-20 MG/5ML suspension 30 mL  30 mL Oral Q4H PRN Adonis Brook, NP      . chlordiazePOXIDE (LIBRIUM) capsule 25 mg  25 mg Oral QID PRN Jomarie Longs, MD      . fluPHENAZine (PROLIXIN) tablet 7.5 mg  7.5 mg Oral QPM Jameila Keeny, MD   7.5 mg at 10/10/15 1712  . [START ON 10/12/2015] fluPHENAZine (PROLIXIN) tablet 7.5 mg  7.5 mg Oral Daily Ardon Franklin, MD      . gabapentin (NEURONTIN) capsule 300 mg  300 mg Oral TID Jomarie Longs, MD      . ibuprofen (ADVIL,MOTRIN) tablet 400 mg  400 mg Oral Q6H PRN Jomarie Longs, MD   400 mg at 10/11/15 0641  . lithium carbonate capsule 300 mg  300 mg Oral BID WC Jomarie Longs, MD   300 mg at 10/11/15 0746  . magnesium hydroxide (MILK OF MAGNESIA) suspension 30 mL  30 mL Oral Daily PRN Adonis Brook, NP      . nitrofurantoin (macrocrystal-monohydrate) (MACROBID) capsule 100 mg  100 mg Oral Q12H Alonah Lineback, MD   100 mg at 10/11/15 0745  . OLANZapine zydis (ZYPREXA) disintegrating tablet 5 mg  5 mg Oral Q6H PRN Jomarie Longs, MD   5 mg at 10/11/15 0951  . trihexyphenidyl (ARTANE) tablet 5 mg  5 mg Oral BID WC Carrieann Spielberg, MD   5 mg at 10/11/15 0746  . zolpidem (AMBIEN) tablet 10 mg  10 mg Oral QHS Jomarie Longs, MD   10 mg at 10/10/15 2115   Lab Results:  No results found for this or any previous visit (from the past 48 hour(s)).  Physical Findings: AIMS: Facial and Oral Movements Muscles of Facial Expression: None, normal Lips and Perioral Area: None, normal Jaw: None, normal Tongue: None, normal,Extremity Movements Upper (arms, wrists, hands, fingers): None, normal Lower (legs, knees, ankles, toes): None, normal, Trunk Movements Neck, shoulders, hips: None, normal, Overall Severity Severity of abnormal movements (highest  score from questions above): None, normal Incapacitation due to abnormal movements: None, normal Patient's awareness of abnormal movements (rate only patient's report): No Awareness, Dental Status Current problems with teeth and/or dentures?: No Does patient usually wear dentures?: No  CIWA:  CIWA-Ar Total: 0 COWS:     Musculoskeletal: Strength & Muscle Tone: within normal limits Gait & Station: normal Patient leans: N/A  Psychiatric Specialty Exam: Review of Systems  Psychiatric/Behavioral: Positive for suicidal ideas and hallucinations. Negative for memory loss. The patient is nervous/anxious and has insomnia (Improving).   All other systems reviewed and are negative.   Blood pressure 118/72, pulse 107, temperature 97.8 F (36.6 C), temperature source Oral, resp. rate 16, height  (1.626 m), weight 114.306 kg (252 lb), last menstrual period 06/01/2015, SpO2 100 %.Body mass index is 43.23 kg/(m^2).  General Appearance: Casual  Eye Contact::  Good  Speech:  Clear and Coherent  Volume:  Normal  Mood:  Anxious and Depressed  Affect:  Tearful  Thought Process:  Linear  Orientation:  Full (Time, Place, and Person)  Thought Content:  Hallucinations: Auditory improving  Suicidal Thoughts:  Yes.  without intent/plan on and off - pt cut self with a piece of plastic on Sunday (10/09/15) while on the unit.  Homicidal Thoughts:  No  Memory:  Immediate;   Fair Recent;   Fair Remote;   Fair  Judgement:  Impaired  Insight:  Fair  Psychomotor Activity:  Normal  Concentration:  Poor  Recall:  [employer] of Knowledge:Fair  Language: Fair  Akathisia:  No  Handed:  Right  AIMS (if indicated):   0  Assets:  Communication Skills Desire for Improvement Physical Health Social Support  ADL's:  Intact  Cognition: WNL  Sleep:  Number of Hours: 6.5   Treatment Plan Summary: Daily contact with patient to assess and evaluate symptoms and progress in treatment and Medication management    Will increase Prolixin to 7. 5 mg po qam and 7.5 mg po qpm for psychosis, mood sx. Will continue Artane 5 mg po bid for EPS. Will continue Lithium 300 mg po bid for mood lability. Li level - subtherapeutic -  repeat Li level on 10/12/15. Will continue Ambien 10 mg po qhs prn for sleep. Will increase Gabapentin to 300 mg po tid for anxiety sx. Pt with paradoxical effect to Vistaril . Will have Zyprexa 10 mg po tid prn for severe anxiety /agitation. Will continue Librium 25 mg po qid prn for anxiety/withdrawl sx. Will continue to monitor vitals ,medication compliance and treatment side effects while patient is here.  Will monitor for medical issues as well as call consult as needed.  Will continue Nitrofurantoin 100 mg po bid for recurrent hx of UTI. CSW will start working on disposition.  Patient to participate in therapeutic milieu .   Eshan Trupiano, MD 10/11/2015, 12:56 PM

## 2015-10-12 ENCOUNTER — Ambulatory Visit (HOSPITAL_COMMUNITY): Payer: Self-pay | Admitting: Clinical

## 2015-10-12 LAB — LITHIUM LEVEL: Lithium Lvl: 0.22 mmol/L — ABNORMAL LOW (ref 0.60–1.20)

## 2015-10-12 MED ORDER — FLUPHENAZINE HCL 5 MG PO TABS
5.0000 mg | ORAL_TABLET | Freq: Every evening | ORAL | Status: DC
  Filled 2015-10-12 (×2): qty 1

## 2015-10-12 MED ORDER — FLUPHENAZINE DECANOATE 25 MG/ML IJ SOLN: 25.0000 mg | INTRAMUSCULAR | Status: DC

## 2015-10-12 MED ORDER — FLUPHENAZINE HCL 2.5 MG PO TABS: 7.5000 mg | ORAL_TABLET | ORAL | Status: DC

## 2015-10-12 MED ORDER — LITHIUM CARBONATE 300 MG PO CAPS
600.0000 mg | ORAL_CAPSULE | Freq: Every day | ORAL | Status: DC
  Filled 2015-10-12 (×2): qty 2

## 2015-10-12 MED ORDER — FLUPHENAZINE DECANOATE 25 MG/ML IJ SOLN
25.0000 mg | INTRAMUSCULAR | Status: DC
  Administered 2015-10-12: 25 mg via INTRAMUSCULAR
  Filled 2015-10-12: qty 1

## 2015-10-12 MED ORDER — LITHIUM CARBONATE 300 MG PO CAPS: ORAL_CAPSULE | ORAL | Status: DC

## 2015-10-12 MED ORDER — FLUPHENAZINE HCL 5 MG PO TABS
5.0000 mg | ORAL_TABLET | Freq: Every day | ORAL | Status: DC
Start: 2015-10-13 — End: 2015-10-12
  Filled 2015-10-12 (×2): qty 1

## 2015-10-12 MED ORDER — GABAPENTIN 300 MG PO CAPS: 300.0000 mg | ORAL_CAPSULE | Freq: Three times a day (TID) | ORAL | Status: DC

## 2015-10-12 MED ORDER — TRIHEXYPHENIDYL HCL 5 MG PO TABS: 5.0000 mg | ORAL_TABLET | Freq: Two times a day (BID) | ORAL | Status: DC

## 2015-10-12 MED ORDER — FLUPHENAZINE HCL 5 MG PO TABS: ORAL_TABLET | ORAL | Status: DC

## 2015-10-12 MED ORDER — LITHIUM CARBONATE 300 MG PO CAPS
300.0000 mg | ORAL_CAPSULE | Freq: Every day | ORAL | Status: DC
  Filled 2015-10-12 (×2): qty 1

## 2015-10-12 MED ORDER — ZOLPIDEM TARTRATE 10 MG PO TABS: 10.0000 mg | ORAL_TABLET | Freq: Every day | ORAL | Status: AC

## 2015-10-12 NOTE — Discharge Summary (Signed)
Physician Discharge Summary Note  Patient:  Teresa Bartlett is an 25 y.o., female MRN:  [id number] DOB:  19-Jul-1990 Patient phone:  [phone] (home)  Patient address:   [address]  [place] [address],  Total Time spent with patient: 30 minutes  Date of Admission:  10/06/2015 Date of Discharge: 10/12/2015  Reason for Admission:  psychosis  Principal Problem: Bipolar disorder, curr episode mixed, severe, with psychotic features [facility]) Discharge Diagnoses: Patient Active Problem List   Diagnosis Date Noted  . Urinary tract infection, site not specified [N39.0] 10/08/2015  . Bipolar disorder, curr episode mixed, severe, with psychotic features (HCC) [F31.64] 10/07/2015  . Chronic post-traumatic stress disorder (PTSD) [F43.12] 10/07/2015  . Opioid use disorder, moderate, in sustained remission [F11.90] 10/07/2015  . Kidney stone [N20.0] 04/25/2015  . Fibromyalgia [M79.7] 04/25/2015  . Left wrist pain [M25.532] 11/09/2014  . Fatigue [R53.83] 10/05/2014  . Polyarthralgia [M25.50] 10/05/2014  . Myalgia and myositis [M79.1, M60.9] 10/05/2014  . Vestibular migraine [G43.109] 08/24/2014  . Contact dermatitis [L25.9] 05/10/2014  . Palpitations [R00.2] 03/18/2014  . Benign paroxysmal positional vertigo [H81.10] 03/18/2014  . Right foot pain [M79.671] 11/16/2013  . Insomnia [G47.00] 10/26/2013  . Routine general medical examination at a health care facility [Z00.00] 09/15/2013  . Left ankle injury [S99.912A] 07/29/2013  . Right shoulder pain [M25.511] 07/29/2013  . 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 Normal pregnancy [Z34.90] 06/15/2012    Musculoskeletal: Strength & Muscle Tone: within normal limits Gait & Station: normal Patient leans: N/A  Psychiatric Specialty Exam:  SEE MD SRA Physical Exam  Vitals reviewed. Psychiatric: Her mood appears not anxious. Thought content is not paranoid. She does not exhibit a depressed mood. She expresses no homicidal and  no suicidal ideation.    Review of Systems  Cardiovascular: Negative for chest pain.  Psychiatric/Behavioral: Negative for depression, suicidal ideas, hallucinations and substance abuse. The patient is not nervous/anxious.   All other systems reviewed and are negative.   Blood pressure 114/67, pulse 97, temperature 97.9 F (36.6 C), temperature source Oral, resp. rate 16, height 5\' 4"  (1.626 m), weight 114.306 kg (252 lb), last menstrual period 06/01/2015, SpO2 100 %.Body mass index is 43.23 kg/(m^2).  Have you used any form of tobacco in the last 30 days? (Cigarettes, Smokeless Tobacco, Cigars, and/or Pipes): No  Has this patient used any form of tobacco in the last 30 days? (Cigarettes, Smokeless Tobacco, Cigars, and/or Pipes) N/A  Past Medical History:  Past Medical History  Diagnosis Date  . Hip dysplasia   . Sacroiliac joint dysfunction of both sides   . IBS (irritable bowel syndrome)   . Normal pregnancy 06/15/2012  . Depression   . Anxiety   . Asthma     "very mild"  . Complication of anesthesia     convulsion with epidural redose  . IDA (iron deficiency anemia)   . Cardiac dysrhythmia, unspecified   . Migraine, unspecified, without mention of intractable migraine without mention of status migrainosus   . Kidney stone   . Fibromyalgia January, 2016  . Schizoaffective disorder, bipolar type [facility])     Past Surgical History  Procedure Laterality Date  . Laparoscopic tubal ligation  09/16/2012    Procedure: LAPAROSCOPIC TUBAL LIGATION;  Surgeon: [name], MD;  Location: [facility];  Service: Gynecology;  Laterality: Bilateral;  . Shoulder surgery Right    Family History:  Family History  Problem Relation Age of Onset  . Diabetes Mother   . Arthritis Mother   .  Kidney disease Mother   . Mental illness Mother   . Fibromyalgia Mother   . Migraines Mother   . Osteoporosis Mother   . Drug abuse Mother   . Anxiety disorder Mother   . Heart disease Father   .  Diabetes Father   . Hypertension Father   . Hyperlipidemia Father   . Mental illness Father     ptsd and bipolar  . Heart attack Father   . Depression Father   . Bipolar disorder Father   . Drug abuse Father   . Alcohol abuse Father   . Heart disease Paternal Uncle   . Diabetes Maternal Grandmother   . Arthritis Maternal Grandmother   . Cancer Paternal Grandmother     colon  . Hyperlipidemia Paternal Grandmother   . Hypertension Paternal Grandmother   . Alcohol abuse Paternal Grandmother   . Drug abuse Paternal Grandmother   . Diabetes Paternal Grandfather   . Hyperlipidemia Paternal Grandfather   . Hypertension Paternal Grandfather   . Mental illness Paternal Grandfather   . Heart disease Paternal Grandfather   . Alcohol abuse Paternal Grandfather   . Other Neg Hx    Social History:  History  Alcohol Use No    Comment: occasional      History  Drug Use No    Social History   Social History  . Marital Status: Married    Spouse Name: N/A  . Number of Children: N/A  . Years of Education: N/A   Social History Main Topics  . Smoking status: Never Smoker   . Smokeless tobacco: Never Used  . Alcohol Use: No     Comment: occasional   . Drug Use: No  . Sexual Activity: Not Asked   Other Topics Concern  . None   Social History Narrative   She is a stay at home mother.   She lives with husband and two sons.   Highest level of education:  Two years of college    Past Psychiatric History: Hospitalizations:  Outpatient Care:  Substance Abuse Care:  Self-Mutilation:  Suicidal Attempts:  Violent Behaviors:   Risk to Self: Suicidal Ideation: Yes-Currently Present Suicidal Intent: Yes-Currently Present Is patient at risk for suicide?: Yes Suicidal Plan?: Yes-Currently Present Specify Current Suicidal Plan:  (take pills) Access to Means: Yes Specify Access to Suicidal Means:  (she has prescriptions that she takes at home) What has been your use of  drugs/alcohol within the last 12 months?:  (none) How many times?:  (none) Other Self Harm Risks:  (cutting and stratching herself over body) Triggers for Past Attempts:  (no past attempts) Intentional Self Injurious Behavior: Cutting Comment - Self Injurious Behavior:  (cuts self with nails or other sharp objects) Risk to Others: Homicidal Ideation: No Thoughts of Harm to Others: No Current Homicidal Intent: No Current Homicidal Plan: No Access to Homicidal Means: No Identified Victim:  (none) History of harm to others?: No Assessment of Violence: None Noted Violent Behavior Description:  (none) Does patient have access to weapons?: No Criminal Charges Pending?: No Does patient have a court date: No Prior Inpatient Therapy: Prior Inpatient Therapy: Yes Prior Therapy Dates:  (July 2016  august 1016) Prior Therapy Facilty/Provider(s):  [facility] Reason for Treatment:  (suicidal ideations) Prior Outpatient Therapy: Prior Outpatient Therapy: Yes Prior Therapy Dates:  ([facility]) [facility]ilty/[facility](s):  ([facility]) Reason for Treatment:  (bipolar, sucidial ideations, anxiety hallucinations) Does patient have an  ACCT team?: No Does patient have Intensive In-House Services?  : No Does patient have [facility] services? : No Does patient have P4CC services?: No  Level of Care:  OP  Hospital Course:  Teresa Bartlett was admitted for Bipolar disorder, curr episode mixed, severe, with psychotic features (HCC) and crisis management.  She was treated discharged with the medications listed below under Medication List.  Medical problems were identified and treated as needed.  Home medications were restarted as appropriate.  Improvement was monitored by observation and Teresa Bartlett daily report of symptom reduction.  Emotional and mental status was monitored by daily self-inventory reports completed by Teresa Bartlett  and clinical staff.         Teresa Bartlett was evaluated by the treatment team for stability and plans for continued recovery upon discharge.  Teresa Bartlett motivation was an integral factor for scheduling further treatment.  Employment, transportation, bed availability, health status, family support, and any pending legal issues were also considered during her hospital stay.  She was offered further treatment options upon discharge including but not limited to Residential, Intensive Outpatient, and Outpatient treatment.  Teresa Bartlett will follow up with the services as listed below under Follow Up Information.     Upon completion of this admission the patient was both mentally and medically stable for discharge denying suicidal/homicidal ideation, auditory/visual/tactile hallucinations, delusional thoughts and paranoia.      Consults:  psychiatry  Significant Diagnostic Studies:  labs: per lab  Discharge Vitals:   Blood pressure 114/67, pulse 97, temperature 97.9 F (36.6 C), temperature source Oral, resp. rate 16, height  (1.626 m), weight 114.306 kg (252 lb), last menstrual period 06/01/2015, SpO2 100 %. Body mass index is 43.23 kg/(m^2). Lab Results:   Results for orders placed or performed during the hospital encounter of 10/06/15 (from the past 72 hour(s))  Lithium level     Status: Abnormal   Collection Time: 10/12/15  6:38 AM  Result Value Ref Range   Lithium Lvl 0.22 (L) 0.60 - 1.20 mmol/L    Comment: Performed at [facility]    Physical Findings: AIMS: Facial and Oral Movements Muscles of Facial Expression: None, normal Lips and Perioral Area: None, normal Jaw: None, normal Tongue: None, normal,Extremity Movements Upper (arms, wrists, hands, fingers): None, normal Lower (legs, knees, ankles, toes): None, normal, Trunk Movements Neck, shoulders, hips: None, normal, Overall Severity Severity of abnormal movements (highest score from  questions above): None, normal Incapacitation due to abnormal movements: None, normal Patient's awareness of abnormal movements (rate only patient's report): No Awareness, Dental Status Current problems with teeth and/or dentures?: No Does patient usually wear dentures?: No  CIWA:  CIWA-Ar Total: 0 COWS:      See Psychiatric Specialty Exam and Suicide Risk Assessment completed by Attending Physician prior to discharge.  Discharge destination:  Home  Is patient on multiple antipsychotic therapies at discharge:  No   Has Patient had three or more failed trials of antipsychotic monotherapy by history:  No    Recommended Plan for Multiple Antipsychotic Therapies: NA     Medication List    STOP taking these medications        benztropine 1 MG tablet  Commonly known as:  COGENTIN     cetaphil lotion     flintstones complete 60 MG chewable tablet     haloperidol 5 MG tablet  Commonly known as:  HALDOL     ibuprofen 600 MG tablet  Commonly known as:  ADVIL,MOTRIN     lithium 300 MG tablet  Replaced by:  lithium carbonate 300 MG capsule     MELATONIN GUMMIES PO     ondansetron 4 MG disintegrating tablet  Commonly known as:  ZOFRAN ODT     pregabalin 50 MG capsule  Commonly known as:  LYRICA     QUEtiapine 400 MG tablet  Commonly known as:  SEROQUEL     venlafaxine XR 75 MG 24 hr capsule  Commonly known as:  EFFEXOR-XR      TAKE these medications      Indication   albuterol 108 (90 BASE) MCG/ACT inhaler  Commonly known as:  PROVENTIL HFA;VENTOLIN HFA  Inhale 2 puffs into the lungs every 6 (six) hours as needed for wheezing or shortness of breath.      fluPHENAZine 5 MG tablet  Commonly known as:  PROLIXIN  Take 1 tablet (5mg ) every morning and every evening   Indication:  thoughts/mood     fluPHENAZine decanoate 25 MG/ML injection  Commonly known as:  PROLIXIN  Inject 1 mL (25 mg total) into the muscle every 14 (fourteen) days. Dose given 10/12/15    Indication:  thoughts/mood     gabapentin 300 MG capsule  Commonly known as:  NEURONTIN  Take 1 capsule (300 mg total) by mouth 3 (three) times daily.   Indication:  anxiety./agitation/pain     lithium carbonate 300 MG capsule  Take 1 capsule (300mg ) in the morning and 2 capsules (600mg ) every evening   Indication:  mood     trihexyphenidyl 5 MG tablet  Commonly known as:  ARTANE  Take 1 tablet (5 mg total) by mouth 2 (two) times daily with a meal.   Indication:  Extrapyramidal Reaction caused by Medications     zolpidem 10 MG tablet  Commonly known as:  AMBIEN  Take 1 tablet (10 mg total) by mouth at bedtime.   Indication:  Trouble Sleeping       Follow-up Information    Follow up with [facility] On 10/13/2015.   Why:  See [place]ita tomorrow at 8:45 to start IOP   Contact information:   [address]  [facility]  [phone]      Follow up with [facility].   Why:  Rod has Faxed information to them   Contact information:   [phone]      Follow-up recommendations:  Activity:  as tol Diet:  as tol  Comments:  1.  Take all your medications as prescribed.              2.  Report any adverse side effects to outpatient provider.                       3.  Patient instructed to not use alcohol or illegal drugs while on prescription medicines.            4.  In the event of worsening symptoms, instructed patient to call 911, the crisis hotline or go to nearest emergency room for evaluation of symptoms.  Total Discharge Time:  30 min  Signed: Velna HatchetSheila May Braidyn Scorsone AGNP-BC 10/12/2015, 1:09 PM

## 2015-10-12 NOTE — [facility] Suicide Risk Assessment (Signed)
[facility]   Demographic Factors:  Caucasian  Total Time spent with patient: 30 minutes  Musculoskeletal: Strength & Muscle Tone: within normal limits Gait & Station: normal Patient leans: N/A  Psychiatric Specialty Exam: Physical Exam  Review of Systems  Psychiatric/Behavioral: Negative for depression, suicidal ideas and hallucinations. The patient is not nervous/anxious.   All other systems reviewed and are negative.   Blood pressure 114/67, pulse 97, temperature 97.9 F (36.6 C), temperature source Oral, resp. rate 16, height 5\' 4"  (1.626 m), weight 114.306 kg (252 lb), last menstrual period 06/01/2015, SpO2 100 %.Body mass index is 43.23 kg/(m^2).  General Appearance: Casual  Eye Contact::  Fair  Speech:  Clear and Coherent409  Volume:  Normal  Mood:  Euthymic  Affect:  Appropriate  Thought Process:  Coherent  Orientation:  Full (Time, Place, and Person)  Thought Content:  WDL  Suicidal Thoughts:  No  Homicidal Thoughts:  No  Memory:  Immediate;   Fair Recent;   Fair Remote;   Fair  Judgement:  Fair  Insight:  Fair  Psychomotor Activity:  Normal  Concentration:  Fair  Recall:  FiservFair  Fund of Knowledge:Fair  Language: Fair  Akathisia:  No  Handed:  Right  AIMS (if indicated):     Assets:  Communication Skills Desire for Improvement  Sleep:  Number of Hours: 6.75  Cognition: WNL  ADL's:  Intact   Have you used any form of tobacco in the last 30 days? (Cigarettes, Smokeless Tobacco, Cigars, and/or Pipes): No  Has this patient used any form of tobacco in the last 30 days? (Cigarettes, Smokeless Tobacco, Cigars, and/or Pipes) No  Mental Status Per Nursing Assessment::   On Admission:  Suicidal ideation indicated by patient  Current Mental Status by Physician: pt denies SI/HI/AH/VH  Loss Factors: Financial problems/change in socioeconomic status  Historical Factors: Impulsivity  Risk Reduction Factors:   Responsible for  children under 27 years of age  Continued Clinical Symptoms:  Previous Psychiatric Diagnoses and Treatments  Cognitive Features That Contribute To Risk:  Polarized thinking    Suicide Risk:  Minimal: No identifiable suicidal ideation.  Patients presenting with no risk factors but with morbid ruminations; may be classified as minimal risk based on the severity of the depressive symptoms  Principal Problem: Bipolar disorder, curr episode mixed, severe, with psychotic features [facility](HCC) Discharge Diagnoses:  Patient Active Problem List   Diagnosis Date Noted  . Urinary tract infection, site not specified [N39.0] 10/08/2015  . Bipolar disorder, curr episode mixed, severe, with psychotic features (HCC) [F31.64] 10/07/2015  . Chronic post-traumatic stress disorder (PTSD) [F43.12] 10/07/2015  . Opioid use disorder, moderate, in sustained remission [F11.90] 10/07/2015  . Kidney stone [N20.0] 04/25/2015  . Fibromyalgia [M79.7] 04/25/2015  . Left wrist pain [M25.532] 11/09/2014  . Fatigue [R53.83] 10/05/2014  . Polyarthralgia [M25.50] 10/05/2014  . Myalgia and myositis [M79.1, M60.9] 10/05/2014  . Vestibular migraine [G43.109] 08/24/2014  . Contact dermatitis [L25.9] 05/10/2014  . Palpitations [R00.2] 03/18/2014  . Benign paroxysmal positional vertigo [H81.10] 03/18/2014  . Right foot pain [M79.671] 11/16/2013  . Insomnia [G47.00] 10/26/2013  . Routine general medical examination at a health care facility [Z00.00] 09/15/2013  . Left ankle injury [S99.912A] 07/29/2013  . Right shoulder pain [M25.511] 07/29/2013  . 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 Normal pregnancy [Z34.90] 06/15/2012    Follow-up Information    Follow up with [facility] On 10/13/2015.   Why:  See [place] Sinkita tomorrow at 8:45  to start IOP   Contact information:   [address]  [facility]  [phone]      Plan Of Care/Follow-up recommendations:  Activity:  No restrictions Diet:  regular Tests:   Follow up prolactine level Other:  Prolixin decanoate 25 mg IM X 2 weekly , first dose today 10/12/15.  Is patient on multiple antipsychotic therapies at discharge:  No   Has Patient had three or more failed trials of antipsychotic monotherapy by history:  No  Recommended Plan for Multiple Antipsychotic Therapies: NA    Gabrian Hoque MD 10/12/2015, 9:40 AM

## 2015-10-12 NOTE — Tx Team (Signed)
Interdisciplinary Treatment Plan Update (Adult)  Date:  10/12/2015 Time Reviewed:  11:05 AM  Progress in Treatment: Attending groups: Yes. Participating in groups: Yes. Taking medication as prescribed:  Yes. Tolerating medication:  Yes. Family/Significant other contact made: Yes. Patient understands diagnosis:  Yes, as evidenced by seeking help with AVH and SI. Discussing patient identified problems/goals with staff:  Yes, see initial care plan. Medical problems stabilized or resolved:  Yes Denies suicidal/homicidal ideation: Yes. Issues/concerns per patient self-inventory: No. Other:  New problem(s) identified:   Discharge Plan or Barriers: See below  Reason for Continuation of Hospitalization:   Comments: 10/06/15: Teresa Bartlett is an 25 y.o. female who presented to [facility] as a walk [facility]. [facility] and her therapist suggested she come in today. Patient discussed a long history of suicidal ideations and in patient hospitalizations. She was in [facility] in July of this year and had 2 other in patient treatment placements back to back at [facility] earlier in the year. Patient has attended partial hospitalization and Intensive Outpatient services with [facility] earlier in the year as well. Prolixin, Lithium, Seroquel  Estimated length of stay: D/C today  New goal(s):  Review of initial/current patient goals per problem list:  1. Goal(s): Patient will participate in aftercare plan  Met: Yes   Target date: at discharge  As evidenced by: Patient will participate within aftercare plan AEB aftercare provider and housing plan at discharge being identified. 10/07/15: Pt will return home and follow-up outpt.   2. Goal (s): Patient will exhibit decreased depressive symptoms and suicidal ideations.  Met:No  Target date: at discharge  As evidenced by: Patient will utilize self rating of depression at 3 or below and demonstrate  decreased signs of depression or be deemed stable for discharge by MD.           10/07/15: Pt rates her depression as 10/10. Will continue to assess.  10/12/2015 Teresa Bartlett denies depression today  3. Goal(s): Patient will demonstrate decreased signs and symptoms of anxiety.  Met:No   Target date: at discharge  As evidenced by: Patient will utilize self rating of anxiety at 3 or below and demonstrated decreased signs of anxiety, or be deemed stable for discharge by MD 10/07/15: Pt rates her anxiety as 10/10. Will continue to assess.  10/12/2015 Teresa Bartlett rates her anxiety at a 6 today, but acknowleges it is situation specific as she will not be returning home.  "I'll be OK once I get settled in."  4. Goal(s): Patient will demonstrate decreased signs of psychosis.  Met: No  Target date:at discharge  As evidenced by: Patient will demonstrate decreased signs of psychosis as evidenced by a reduction in AVH, paranoia, and/or delusions.   10/07/15: Pt continues to endorse AVH.  10/12/15   No signs nor symptoms of psychosis today    Attendees: Patient:  10/12/2015 11:05 AM  Family:   10/12/2015 11:05 AM  Physician:  Dr. Ursula Alert, MD 10/12/2015 11:05 AM  Nursing: Gaylan Gerold, RN 10/12/2015 11:05 AM  Case Manager:  Roque Lias, LCSW 10/12/2015 11:05 AM  Counselor:  Matthew Saras, MSW Intern 10/12/2015 11:05 AM  Other:   10/12/2015 11:05 AM  Other:   10/12/2015 11:05 AM  Other:   10/12/2015 11:05 AM  Other:  10/12/2015 11:05 AM  Other:    Other:    Other:    Other:    Other:    Other:      Scribe for Treatment Team:  Georga Kaufmann, MSW Intern 10/12/2015 11:05 AM

## 2015-10-12 NOTE — Progress Notes (Signed)
D: Pt is alert and oriented x 4. Pt denies pain, anxiety, depression, SI, HI and AVH; however, Pt continues to be isolative with flat affect. Pt who is a possible d/c on Wednesday plan to go for long-term treatment. She states, "my husband and I have agreed that I need to go for a form of long-term treatment; I think that is going to be the best move for me." Pt would still love to complete her education as soon as she gets better. Pt continues to be nonaggressive and pleasant.  A: Medications offered as prescribed.  Support, encouragement, and safe environment provided.  15-minute safety checks continue.  R: Pt refused her night time sleep medication; she states, "I want to see how I do without it today."  Pt did attend group. Safety checks continue.

## 2015-10-12 NOTE — Progress Notes (Signed)
  [facility] :  Will you be returning to the same living situation after discharge:  Yes,  home, though patient is saying she will be there through the weekend, after which she will be going to stay with a friend in [place] At discharge, do you have transportation home?: Yes,  family Do you have the ability to pay for your medications: Yes,  insurance  Release of information consent forms completed and in the chart;  Patient's signature needed at discharge.  Patient to Follow up at: Follow-up Information    Follow up with [facility] On 10/13/2015.   Why:  See [place] Sinkita tomorrow at 8:45 to start IOP   Contact information:   [address]  [facility]  [phone]      [facility] with [facility].   Why:  Rod has Faxed information to them   Contact information:   [phone]      Patient denies SI/HI: Yes,  yes    Safety Planning and Suicide Prevention discussed: Yes,  yes  Have you used any form of tobacco in the last 30 days? (Cigarettes, Smokeless Tobacco, Cigars, and/or Pipes): No  Has patient been referred to the Quitline?: N/A patient is not a smoker  Kiribatiorth, Thereasa DistanceRodney B 10/12/2015, 11:11 AM

## 2015-10-12 NOTE — Progress Notes (Signed)
Discharge note: Pt received both written and verbal discharge instructions. Pt verbalized understanding of discharge instructions. Pt agreed to f/u appt and med regimen. Pt denies suicidal thought at time of discharge. Pt received belongings from room and locker. Pt received prescriptions. Pt safely left [facility].

## 2015-10-13 ENCOUNTER — Encounter (HOSPITAL_COMMUNITY): Payer: Self-pay | Admitting: Psychiatry

## 2015-10-13 ENCOUNTER — Other Ambulatory Visit (HOSPITAL_COMMUNITY): Payer: BLUE CROSS/BLUE SHIELD | Admitting: Psychiatry

## 2015-10-13 DIAGNOSIS — F250 Schizoaffective disorder, bipolar type: Secondary | ICD-10-CM | POA: Insufficient documentation

## 2015-10-13 DIAGNOSIS — M797 Fibromyalgia: Secondary | ICD-10-CM | POA: Insufficient documentation

## 2015-10-13 DIAGNOSIS — F3162 Bipolar disorder, current episode mixed, moderate: Secondary | ICD-10-CM | POA: Diagnosis not present

## 2015-10-13 DIAGNOSIS — G4700 Insomnia, unspecified: Secondary | ICD-10-CM | POA: Insufficient documentation

## 2015-10-13 DIAGNOSIS — F419 Anxiety disorder, unspecified: Secondary | ICD-10-CM | POA: Diagnosis not present

## 2015-10-13 NOTE — Progress Notes (Signed)
Psychiatric Initial Adult Assessment   Patient Identification: Teresa Bartlett MRN:  [id number] Date of Evaluation:  10/13/2015 Referral Source: inpatient psychiatric, [facility] Chief Complaint:   Visit Diagnosis:    ICD-9-CM ICD-10-CM   1. Bipolar 1 disorder, mixed, moderate (HCC) 296.62 F31.62    Diagnosis:   Patient Active Problem List   Diagnosis Date Noted  . Bipolar 1 disorder, mixed, moderate (HCC) [F31.62] 10/13/2015    Priority: Medium    Class: Chronic  . Urinary tract infection, site not specified [N39.0] 10/08/2015  . Bipolar disorder, curr episode mixed, severe, with psychotic features (HCC) [F31.64] 10/07/2015  . Chronic post-traumatic stress disorder (PTSD) [F43.12] 10/07/2015  . Opioid use disorder, moderate, in sustained remission [F11.90] 10/07/2015  . Kidney stone [N20.0] 04/25/2015  . Fibromyalgia [M79.7] 04/25/2015  . Left wrist pain [M25.532] 11/09/2014  . Fatigue [R53.83] 10/05/2014  . Polyarthralgia [M25.50] 10/05/2014  . Myalgia and myositis [M79.1, M60.9] 10/05/2014  . Vestibular migraine [G43.109] 08/24/2014  . Contact dermatitis [L25.9] 05/10/2014  . Palpitations [R00.2] 03/18/2014  . Benign paroxysmal positional vertigo [H81.10] 03/18/2014  . Right foot pain [M79.671] 11/16/2013  . Insomnia [G47.00] 10/26/2013  . Routine general medical examination at a health care facility [Z00.00] 09/15/2013  . Left ankle injury [S99.912A] 07/29/2013  . Right shoulder pain [M25.511] 07/29/2013  . 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 Normal pregnancy [Z34.90] 06/15/2012   History of Present Illness:  Teresa Bartlett was here very recently and was just discharged from inpatient yesterday.  Her issues remain the same with depression and mood lability.  She ran out of medications and started hallucinating and dissociating with a lot of paranoia hard to separate from the disassociation.  Currently she is doing much better with the moods, no hallucination or  disassociation after medications were changed in the hospital.  Anxiety and depression have persisted due to her husband telling her she has to leave even if she is homeless.  No friend or family will let her live with them so she is very worried and scared.  Over all she looks much better than on her last visit except for the new worries. Elements:  Location:  anxiety and depression. Quality:  cannot think of a solution for herself. Severity:  daily worry and unhappiness. Timing:  just out of inpatient and kicked out of her house by her husband. Duration:  years worsening last few weeks. Context:  as above. Associated Signs/Symptoms: Depression Symptoms:  depressed mood, insomnia, fatigue, feelings of worthlessness/guilt, difficulty concentrating, hopelessness, impaired memory, suicidal thoughts without plan, anxiety, loss of energy/fatigue, (Hypo) Manic Symptoms:  Distractibility, Irritable Mood, Anxiety Symptoms:  Excessive Worry, Psychotic Symptoms:  none currently PTSD Symptoms: Negative  Past Medical History:  Past Medical History  Diagnosis Date  . Hip dysplasia   . Sacroiliac joint dysfunction of both sides   . IBS (irritable bowel syndrome)   . Normal pregnancy 06/15/2012  . Depression   . Anxiety   . Asthma     "very mild"  . Complication of anesthesia     convulsion with epidural redose  . IDA (iron deficiency anemia)   . Cardiac dysrhythmia, unspecified   . Migraine, unspecified, without mention of intractable migraine without mention of status migrainosus   . Kidney stone   . Fibromyalgia January, 2016  . Schizoaffective disorder, bipolar type [facility])     Past Surgical History  Procedure Laterality Date  . Laparoscopic tubal ligation  09/16/2012    Procedure: LAPAROSCOPIC TUBAL LIGATION;  Surgeon: [name], MD;  Location: [facility];  Service: Gynecology;  Laterality: Bilateral;  . Shoulder surgery Right    Family History:  Family History  Problem  Relation Age of Onset  . Diabetes Mother   . Arthritis Mother   . Kidney disease Mother   . Mental illness Mother   . Fibromyalgia Mother   . Migraines Mother   . Osteoporosis Mother   . Drug abuse Mother   . Anxiety disorder Mother   . Heart disease Father   . Diabetes Father   . Hypertension Father   . Hyperlipidemia Father   . Mental illness Father     ptsd and bipolar  . Heart attack Father   . Depression Father   . Bipolar disorder Father   . Drug abuse Father   . Alcohol abuse Father   . Heart disease Paternal Uncle   . Diabetes Maternal Grandmother   . Arthritis Maternal Grandmother   . Cancer Paternal Grandmother     colon  . Hyperlipidemia Paternal Grandmother   . Hypertension Paternal Grandmother   . Alcohol abuse Paternal Grandmother   . Drug abuse Paternal Grandmother   . Diabetes Paternal Grandfather   . Hyperlipidemia Paternal Grandfather   . Hypertension Paternal Grandfather   . Mental illness Paternal Grandfather   . Heart disease Paternal Grandfather   . Alcohol abuse Paternal Grandfather   . Other Neg Hx    Social History:   Social History   Social History  . Marital Status: Married    Spouse Name: N/A  . Number of Children: N/A  . Years of Education: N/A   Social History Main Topics  . Smoking status: Never Smoker   . Smokeless tobacco: Never Used  . Alcohol Use: No     Comment: occasional   . Drug Use: No  . Sexual Activity: Not Asked   Other Topics Concern  . None   Social History Narrative   She is a stay at home mother.   She lives with husband and two sons.   Highest level of education:  Two years of college   Additional Social History: none  Musculoskeletal: Strength & Muscle Tone: within normal limits Gait & Station: normal Patient leans: N/A  Psychiatric Specialty Exam: HPI  ROS  There were no vitals taken for this visit.There is no weight on file to calculate BMI.  General Appearance: Well Groomed  Eye Contact:   Good  Speech:  Clear and Coherent  Volume:  Normal  Mood:  Anxious  Affect:  Congruent  Thought Process:  Coherent and Logical  Orientation:  Full (Time, Place, and Person)  Thought Content:  Negative  Suicidal Thoughts:  Yes.  without intent/plan  Homicidal Thoughts:  No  Memory:  Immediate;   Good Recent;   Good Remote;   Good  Judgement:  Intact  Insight:  Good  Psychomotor Activity:  Normal  Concentration:  Good  Recall:  Good  Fund of Knowledge:Good  Language: Good  Akathisia:  Negative  Handed:  Right  AIMS (if indicated):  0  Assets:  Communication Skills Desire for Improvement Transportation  ADL's:  Intact  Cognition: WNL  Sleep:  poor   Is the patient at risk to self?  No. Has the patient been a risk to self in the past 6 months?  Yes.   Has the patient been a risk to self within the distant past?  Yes.   Is the patient a risk to  others?  No. Has the patient been a risk to others in the past 6 months?  No. Has the patient been a risk to others within the distant past?  No.  Allergies:   Allergies  Allergen Reactions  . Benadryl [Diphenhydramine] Other (See Comments)    "its like speed"  . Other Hives    Glue used for EKG leads, other adhesives  . Saphris [Asenapine] Hives and Other (See Comments)    "Opposite effect made me sicker"   Current Medications: Current Outpatient Prescriptions  Medication Sig Dispense Refill  . albuterol (PROVENTIL HFA;VENTOLIN HFA) 108 (90 BASE) MCG/ACT inhaler Inhale 2 puffs into the lungs every 6 (six) hours as needed for wheezing or shortness of breath.    . fluPHENAZine (PROLIXIN) 5 MG tablet Take 1 tablet (5mg ) every morning and every evening 60 tablet 0  . fluPHENAZine decanoate (PROLIXIN) 25 MG/ML injection Inject 1 mL (25 mg total) into the muscle every 14 (fourteen) days. Dose given 10/12/15 5 mL 0  . gabapentin (NEURONTIN) 300 MG capsule Take 1 capsule (300 mg total) by mouth 3 (three) times daily. 90 capsule 0  .  lithium carbonate 300 MG capsule Take 1 capsule (300mg ) in the morning and 2 capsules (600mg ) every evening 90 capsule 0  . trihexyphenidyl (ARTANE) 5 MG tablet Take 1 tablet (5 mg total) by mouth 2 (two) times daily with a meal. 60 tablet 0  . zolpidem (AMBIEN) 10 MG tablet Take 1 tablet (10 mg total) by mouth at bedtime. 30 tablet 0   No current facility-administered medications for this visit.    Previous Psychotropic Medications: Yes   Substance Abuse History in the last 12 months:  No.  Consequences of Substance Abuse: Negative  Medical Decision Making:  Established Problem, Worsening (2)  Treatment Plan Summary: daily group therapy. Discussed her rights in that she cannot be kicked out of her own house and resources to follow up with her rights and plans to deal with her situation     Carolanne GrumblingGerald Caitlain Tweed 10/13/201612:21 PM

## 2015-10-13 NOTE — Progress Notes (Signed)
Teresa Bartlett is a 25 y.o. , married, unemployed, Caucasian female who was recently in [facility] (07-18-15 until 07-29-15).  [facility].  Was there from 10-06-15 until 10-12-15.  Her issues remain the same with depression and mood lability. She ran out of medications and started hallucinating and dissociating with a lot of paranoia; it was hard to separate from the disassociation. Currently she is doing much better with the moods, no hallucination or disassociation after medications were changed in the hospital. Anxiety and depression have persisted due to her husband telling her she has to leave the home; even if she is homeless. No friend or family will let her live with them so she is very worried and scared. Over all she looks much better than on her last visit except for the new worries. Reports five past psychiatric hospitalizations. Pt [facility]. According to the pt, she began partial hospitalization 06/21/15. Pt reports the following diagnoses: Schizoaffective, MD, PTSD, Dissociative Disorder, and panic attacks. Pt states that she began having severe mental health symptoms 4 years ago. Pt admits to hx of self-harming. According to the Pt, she would scratch her arms until they bleed. She would also scratch old wounds. Stressors/Triggers: 1) Kids. Pt has an Autistic 36 yr old son and a 793 yr old son.  In-laws are paying daycare for the youngest. 2) Conflictual Relationships with family members. States she just recently reconnected with her paternal Grandmother. Pt is hoping that she will let her stay at her home for ~ three months, since husband is wanting her to move out.  According to pt she has no support. 3) Medical Issues: Fibromyalgia among multiple other illnesses. Family Hx: Mom (drugs, anxiety), Father (Bipolar and drugs), CP-GM (ET [place] and drugs), CP-GF (ET [place]).  [place] Grew up in a home with both addict parents. Father had Bipolar and mother had struggled with anxiety. States parents are hoarders. Mother would restrict patient's food. At age 25, pt states she was bullied because she was "dorky." "I was very quiet in school." Siblings: 1 younger brother and 1 older sister.  Pt has been married for seven yrs.  Denies drugs/ETOH, legal issues, or DUI's. Stopped smoking cigarettes in 2009. Pt completed all forms. Scored 14 on the burns. Pt will attend [facility] for ten days. A: Re-oriented pt. Informed Elana AlmFrankie Powell, LCSW of admit; pt has an upcoming new pt appt with Dr. Michae KavaAgarwal. Encouraged support groups.  Provided pt with [facility], [facility] and [facility] information. R: Pt receptive.         CLARK, RITA, M.Ed. CNA

## 2015-10-13 NOTE — Progress Notes (Signed)
    Daily Group Progress Note  Program: IOP  Group Time: 9:00-10:30  Participation Level: Active  Behavioral Response: Appropriate  Type of Therapy:  Group Therapy  Summary of Progress: Pt. Met with case manager and psychiatrist.      Group Time: 10:30-12:00  Participation Level:  Active  Behavioral Response: Appropriate  Type of Therapy: Psycho-education Group  Summary of Progress: Pt. Participated in discussion about developing vulnerability as resistance to shame. Pt. Watched and discussed Almond Lint video "the power of vulnerability".  Nancie Neas, LPC

## 2015-10-14 ENCOUNTER — Other Ambulatory Visit (HOSPITAL_COMMUNITY): Payer: BLUE CROSS/BLUE SHIELD | Attending: Psychiatry | Admitting: Psychiatry

## 2015-10-14 DIAGNOSIS — F3162 Bipolar disorder, current episode mixed, moderate: Secondary | ICD-10-CM

## 2015-10-17 ENCOUNTER — Other Ambulatory Visit (HOSPITAL_COMMUNITY): Payer: BLUE CROSS/BLUE SHIELD | Admitting: Psychiatry

## 2015-10-17 DIAGNOSIS — F3162 Bipolar disorder, current episode mixed, moderate: Secondary | ICD-10-CM | POA: Diagnosis not present

## 2015-10-17 NOTE — Progress Notes (Signed)
    Daily Group Progress Note  Program: IOP  Group Time: 9:00-10:30  Participation Level: Active  Behavioral Response: Appropriate  Type of Therapy:  Group Therapy  Summary of Progress: Pt. Presented as talkative, overwhelmed by relationship stress. Pt discussed fears related to marital problems and the effect of her husband's bipolar disorder. Pt. Received support from group members and suggestion that she should see an attorney which would help to answer questions about her legal standing that are affecting her anxiety.      Group Time: 10:30-12:00  Participation Level:  Active  Behavioral Response: Appropriate  Type of Therapy: Psycho-education Group  Summary of Progress: Pt. Participated in grief and loss group facilitated by Theda BelfastBob Hamilton.   Shaune PollackBrown, Jennifer B, LPC

## 2015-10-18 ENCOUNTER — Other Ambulatory Visit (HOSPITAL_COMMUNITY): Payer: BLUE CROSS/BLUE SHIELD | Admitting: Psychiatry

## 2015-10-18 DIAGNOSIS — F3162 Bipolar disorder, current episode mixed, moderate: Secondary | ICD-10-CM

## 2015-10-18 NOTE — Progress Notes (Signed)
    Daily Group Progress Note  Program: IOP  Group Time: 9:00-10:30  Participation Level: Active  Behavioral Response: Appropriate  Type of Therapy:  Psycho-education Group  Summary of Progress: Pt. Did not attend medication management group with Elena.      Group Time: 10:30-12:00  Participation Level:  Active  Behavioral Response: Appropriate  Type of Therapy: Group Therapy  Summary of Progress: Pt. Presented as tearful, sad, which she attributed to marital distress. Pt. Reported that husband left the home with the children. Pt. Discussed history of abandonment and thought "I don't know what to do". Pt. Was open to encouragement from the group and suggestion that she should see an attorney regarding domestic issues.   Shaune PollackBrown, Jennifer B, LPC

## 2015-10-18 NOTE — Progress Notes (Signed)
10/18/15 Encounter: 9:15 AM - 12 PM  Purpose: Finding internal balance in recovery. Intervention: Open guided discussion on possessing healthy,balanced elements in the recovery process. Effect: Teresa Bartlett(Teresa Bartlett) recounted how increase in healthy communication is helpful in her current immediate support system.  Donnamarie Rossettiodd [name] CPSS

## 2015-10-19 ENCOUNTER — Ambulatory Visit (HOSPITAL_COMMUNITY): Payer: Self-pay | Admitting: Clinical

## 2015-10-19 ENCOUNTER — Other Ambulatory Visit (HOSPITAL_COMMUNITY): Payer: BLUE CROSS/BLUE SHIELD | Admitting: Psychiatry

## 2015-10-19 DIAGNOSIS — F3162 Bipolar disorder, current episode mixed, moderate: Secondary | ICD-10-CM

## 2015-10-19 NOTE — Progress Notes (Signed)
10/19/15 Encounter: 9:15 - 10:45 AM  Purpose: Performing general wellness check-in. Intervention: Open guided discussion on relevant issues in current recovery. Effect: Teresa Bartlett recounted her spiritual "centeredness" as a result of practicing yoga.  Donnamarie Rossettiodd Demitra Danley CPSS

## 2015-10-20 ENCOUNTER — Other Ambulatory Visit (HOSPITAL_COMMUNITY): Payer: BLUE CROSS/BLUE SHIELD | Admitting: Psychiatry

## 2015-10-20 DIAGNOSIS — F3162 Bipolar disorder, current episode mixed, moderate: Secondary | ICD-10-CM | POA: Diagnosis not present

## 2015-10-20 NOTE — Progress Notes (Signed)
10/20/15 Encounter: 9:15 AM - 12 PM  Purpose:To affirm the statement "I am enough". Intervention: Open guided discussion on practical self-care & increasing sense of worth. Effect: Teresa Bartlett shared how she is currently searching for social belonging in her support system.  Donnamarie Rossettiodd Mileydi Milsap CPSS

## 2015-10-21 ENCOUNTER — Other Ambulatory Visit (HOSPITAL_COMMUNITY): Payer: BLUE CROSS/BLUE SHIELD | Admitting: Psychiatry

## 2015-10-21 DIAGNOSIS — F3162 Bipolar disorder, current episode mixed, moderate: Secondary | ICD-10-CM | POA: Diagnosis not present

## 2015-10-21 MED ORDER — FLUPHENAZINE DECANOATE 25 MG/ML IJ SOLN
37.5000 mg | INTRAMUSCULAR | Status: DC
Start: 2015-10-21 — End: 2015-11-09

## 2015-10-21 NOTE — Progress Notes (Signed)
    Daily Group Progress Note  Program: IOP  Group Time: 9:00-10:30  Participation Level: Active  Behavioral Response: Appropriate  Type of Therapy:  Group Therapy  Summary of Progress: Pt. Presents with primarily flat affect, talks appropriately. Pt. Continues to report distress regarding marital problems. Pt. Reports plans to complete assessment at [facility].      Group Time: 10:30-12:00  Participation Level:  Active  Behavioral Response: Appropriate  Type of Therapy: Psycho-education Group  Summary of Progress: Pt. Participated in discussion about core self-worth co-facilitated by Donnamarie Rossettiodd Welch. Pt. Discussed model of I am good enough-I treat myself with kindness-My tank is full-I am able to be fully present for others.   Shaune PollackBrown, Armonii Sieh B, LPC

## 2015-10-21 NOTE — Progress Notes (Signed)
    Daily Group Progress Note  Program: IOP  Group Time: 9:00-10:30  Participation Level: Active  Behavioral Response: Appropriate  Type of Therapy:  Group Therapy  Summary of Progress: Pt. Presented as sad, appropriately tearful. Pt. Discussed experience parenting child with autism. Pt. Discussed concerns about her husband taking the kids to [place] and not returning them to the school. Pt. Was encouraged to seek legal counsel.     Group Time: 10:30-12:00  Participation Level:  Active  Behavioral Response: Appropriate  Type of Therapy: Psycho-education Group  Summary of Progress: Pt. Participated in yoga session facilitated by Forde RadonLeanne Yates, LPC for energy and increasing mood.   Shaune PollackBrown, Jennifer B, LPC
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
## 2015-10-21 NOTE — Progress Notes (Signed)
    Daily Group Progress Note  Program: IOP  Group Time: 9:00-10:30  Participation Level: Active  Behavioral Response: Appropriate  Type of Therapy:  Group Therapy  Summary of Progress: Pt. Presented as depressed, primarily flat affect; however, reported to the group that she was feeling better.     Group Time: 10:30-12:00  Participation Level:  Minimal  Behavioral Response: Appropriate  Type of Therapy: Psycho-education Group  Summary of Progress: Pt. Participated in discussion about developing healthy boundaries through assertive communication. Distinguished passive, assertive, and aggressive communication styles.   Shaune PollackBrown, Jennifer B, LPC

## 2015-10-24 ENCOUNTER — Other Ambulatory Visit (HOSPITAL_COMMUNITY): Payer: BLUE CROSS/BLUE SHIELD | Admitting: Psychiatry

## 2015-10-24 DIAGNOSIS — F3162 Bipolar disorder, current episode mixed, moderate: Secondary | ICD-10-CM | POA: Diagnosis not present
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
## 2015-10-25 ENCOUNTER — Other Ambulatory Visit (HOSPITAL_COMMUNITY): Payer: BLUE CROSS/BLUE SHIELD | Admitting: Psychiatry

## 2015-10-25 DIAGNOSIS — F319 Bipolar disorder, unspecified: Secondary | ICD-10-CM

## 2015-10-25 DIAGNOSIS — F3162 Bipolar disorder, current episode mixed, moderate: Secondary | ICD-10-CM | POA: Diagnosis not present

## 2015-10-25 NOTE — Progress Notes (Signed)
    Daily Group Progress Note  Program: IOP  Group Time: 0900-1045  Participation Level: Active  Behavioral Response: Appropriate and Sharing  Type of Therapy:  Group Therapy  Summary of Progress: Patient states she is still in shock and disbelief that her husband of seven years is wanting a divorce.  Pt is very fearful of losing her kids.  Pt had a job interview yesterday with a Therapist, [employer]; awaiting a reply.  Pt plans to tap into [facility] for assistance.     Group Time: 1100-1200  Participation Level:  Active  Behavioral Response: Appropriate  Type of Therapy: Psycho-education Group  Summary of Progress: Fernanda DrumDonna Shelton (Librarian, academicexecutive director at [facility]) spoke to the group about all the services they provide within the community.  She provided everyone with a brochure and explained the process of attending a group or enrolling into any of their services.      Chestine SporeLARK, RITA, M.Ed, CNA

## 2015-10-26 ENCOUNTER — Ambulatory Visit (HOSPITAL_COMMUNITY): Payer: Self-pay

## 2015-10-26 ENCOUNTER — Ambulatory Visit (INDEPENDENT_AMBULATORY_CARE_PROVIDER_SITE_OTHER): Payer: BLUE CROSS/BLUE SHIELD | Admitting: Clinical

## 2015-10-26 ENCOUNTER — Encounter (HOSPITAL_COMMUNITY): Payer: Self-pay | Admitting: Clinical

## 2015-10-26 DIAGNOSIS — F5081 Binge eating disorder: Secondary | ICD-10-CM | POA: Diagnosis not present

## 2015-10-26 DIAGNOSIS — F4310 Post-traumatic stress disorder, unspecified: Secondary | ICD-10-CM | POA: Diagnosis not present

## 2015-10-26 DIAGNOSIS — F250 Schizoaffective disorder, bipolar type: Secondary | ICD-10-CM

## 2015-10-26 NOTE — Progress Notes (Signed)
   THERAPIST PROGRESS NOTE  Session Time: 9:00 -10:00  Participation Level: Active  Behavioral Response: CasualAlertDepressed  Type of Therapy: Individual Therapy  Treatment Goals addressed:  improve psychiatric symptoms, emotional regulation (being able to concentrate),  improve unhelpful thought patterns,   Interventions: CBT and Motivational Interviewing, Grounding and Mindfulness Techniques  Summary: Teresa Bartlett is a 25 y.o. female who presents with schizoaffective disorder, bipolar type and PTSD (post-traumatic stress disorder) and Binge eating disorder  Suicidal/Homicidal: No -without intent/plan  Therapist Response:  Teresa Bartlett met with clinician for an individual session. Teresa Bartlett discussed her psychiatric symptoms, her current life events.Teresa Bartlett. Teresa Bartlett shared that she had been inpatient 10/06/15 -10/12/15 and then attended IOP. She shared that she had been admitted because she was experiencing suicidal ideation. She stated that she believes she needed her medications balance. She shared that while inpatient her husband announced her was leaving. He did so and took the kids to live with his parents. After discharge she returned to an empty house and an empty bank account. Teresa Bartlett shared that she was at a loss of what to do. She shared that she has not seen her children since being admitted into inpatient. She shared that she has only had one brief phone conversation with them. The break up of her marriage is hard but the separation from her children is especially. She reported that she has applied for jobs and has been trying to come with a strategy to become independent. Teresa Bartlett shared that she is trying to focus on creating a situation where she can see her children and live independent of her husband.She shared her efforts. Client and clinician discussed healthy coping skills. Client and clinician discussed and  reviewed mindfulness and grounding techniques. Belton shared about her supports and her  plan for safety.    Plan: Return again in 1 -2  weeks.  Diagnosis: Axis I: Schizoaffective disorder, bipolar type and PTSD (post-traumatic stress disorder) and Binge eating disorder   Roberto Hlavaty A, LCSW 10/26/2015
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
## 2015-10-27 ENCOUNTER — Ambulatory Visit (INDEPENDENT_AMBULATORY_CARE_PROVIDER_SITE_OTHER): Payer: BLUE CROSS/BLUE SHIELD | Admitting: Psychiatry

## 2015-10-27 ENCOUNTER — Encounter (HOSPITAL_COMMUNITY): Payer: Self-pay | Admitting: Psychiatry

## 2015-10-27 VITALS — BP 124/82 | HR 76 | Ht 64.0 in | Wt 246.8 lb

## 2015-10-27 DIAGNOSIS — F411 Generalized anxiety disorder: Secondary | ICD-10-CM | POA: Diagnosis not present

## 2015-10-27 DIAGNOSIS — F250 Schizoaffective disorder, bipolar type: Secondary | ICD-10-CM | POA: Diagnosis not present

## 2015-10-27 DIAGNOSIS — F5081 Binge eating disorder: Secondary | ICD-10-CM

## 2015-10-27 DIAGNOSIS — R45851 Suicidal ideations: Secondary | ICD-10-CM

## 2015-10-27 MED ORDER — TRIHEXYPHENIDYL HCL 5 MG PO TABS: 5.0000 mg | ORAL_TABLET | Freq: Two times a day (BID) | ORAL | Status: DC

## 2015-10-27 MED ORDER — FLUPHENAZINE HCL 5 MG PO TABS: 5.0000 mg | ORAL_TABLET | Freq: Three times a day (TID) | ORAL | Status: DC

## 2015-10-27 MED ORDER — GABAPENTIN 300 MG PO CAPS: 300.0000 mg | ORAL_CAPSULE | Freq: Three times a day (TID) | ORAL | Status: DC

## 2015-10-27 MED ORDER — LITHIUM CARBONATE 300 MG PO CAPS: ORAL_CAPSULE | ORAL | Status: DC
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
## 2015-10-28 ENCOUNTER — Other Ambulatory Visit (HOSPITAL_COMMUNITY): Payer: BLUE CROSS/BLUE SHIELD

## 2015-10-31 ENCOUNTER — Other Ambulatory Visit (HOSPITAL_COMMUNITY): Payer: BLUE CROSS/BLUE SHIELD | Admitting: Psychiatry

## 2015-10-31 DIAGNOSIS — F250 Schizoaffective disorder, bipolar type: Secondary | ICD-10-CM

## 2015-10-31 DIAGNOSIS — F3162 Bipolar disorder, current episode mixed, moderate: Secondary | ICD-10-CM | POA: Diagnosis not present
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
## 2015-11-01 ENCOUNTER — Other Ambulatory Visit (HOSPITAL_COMMUNITY): Payer: BLUE CROSS/BLUE SHIELD | Attending: Psychiatry

## 2015-11-01 DIAGNOSIS — F250 Schizoaffective disorder, bipolar type: Secondary | ICD-10-CM

## 2015-11-01 DIAGNOSIS — F3162 Bipolar disorder, current episode mixed, moderate: Secondary | ICD-10-CM | POA: Insufficient documentation

## 2015-11-01 NOTE — Progress Notes (Signed)
    Daily Group Progress Note  Program: IOP  Group Time: 9:00-10:30  Participation Level: Active  Behavioral Response: Appropriate  Type of Therapy:  Group Therapy  Summary of Progress: Pt. Presented as talkative, at times appeared distracted and at times thoughtfully engaged in the group process. Pt. Continues to process her anger regarding her husband's decision to move out of the house. Pt. Reported that she is coping with food and engaging in emotional eating cookies and sweet cereal. Pt. Reports that she is normally a healthy eater, but her therapist told her that it was ok to give herself permission to temporarily find comfort with food.     Group Time: 10:30-12:00  Participation Level:  Active  Behavioral Response: Appropriate  Type of Therapy: Psycho-education Group  Summary of Progress: Pt. Was present for discussion about finding voice in significant relationships. Pt. Reported that she continues to be in "survival mode" due to painful breakup with her husband and it is difficult for her to think about what voice she might have in the relationship.  [facility]

## 2015-11-02 ENCOUNTER — Other Ambulatory Visit (HOSPITAL_COMMUNITY): Payer: BLUE CROSS/BLUE SHIELD

## 2015-11-02 ENCOUNTER — Other Ambulatory Visit (HOSPITAL_COMMUNITY): Payer: BLUE CROSS/BLUE SHIELD | Admitting: Psychiatry

## 2015-11-02 ENCOUNTER — Ambulatory Visit (HOSPITAL_COMMUNITY): Payer: Self-pay | Admitting: Clinical

## 2015-11-02 DIAGNOSIS — F250 Schizoaffective disorder, bipolar type: Secondary | ICD-10-CM

## 2015-11-02 DIAGNOSIS — F3162 Bipolar disorder, current episode mixed, moderate: Secondary | ICD-10-CM | POA: Diagnosis not present
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
## 2015-11-03 ENCOUNTER — Other Ambulatory Visit (HOSPITAL_COMMUNITY): Payer: BLUE CROSS/BLUE SHIELD

## 2015-11-04 ENCOUNTER — Other Ambulatory Visit (HOSPITAL_COMMUNITY): Payer: BLUE CROSS/BLUE SHIELD | Admitting: Psychiatry

## 2015-11-04 DIAGNOSIS — F3162 Bipolar disorder, current episode mixed, moderate: Secondary | ICD-10-CM | POA: Diagnosis not present

## 2015-11-04 DIAGNOSIS — F250 Schizoaffective disorder, bipolar type: Secondary | ICD-10-CM
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
## 2015-11-07 ENCOUNTER — Other Ambulatory Visit (HOSPITAL_COMMUNITY): Payer: BLUE CROSS/BLUE SHIELD | Admitting: Psychiatry

## 2015-11-07 DIAGNOSIS — F250 Schizoaffective disorder, bipolar type: Secondary | ICD-10-CM

## 2015-11-07 DIAGNOSIS — F3162 Bipolar disorder, current episode mixed, moderate: Secondary | ICD-10-CM | POA: Diagnosis not present

## 2015-11-07 NOTE — Progress Notes (Signed)
    Daily Group Progress Note  Program: IOP  Group Time: 9:00-10:30  Participation Level: Active  Behavioral Response: Appropriate  Type of Therapy:  Psycho-education Group  Summary of Progress: Pt. Participated in medication management group with Michelle NasutiElena.      Group Time: 10:30-12:00  Participation Level:  Active  Behavioral Response: Appropriate  Type of Therapy: Psycho-education Group  Summary of Progress: Pt. Presented as talkative, brightened affect. Pt. Reported that she had a good visit with her children. Pt. Reported that she communicated well with estranged husband and discussed possibility for reconciliation, but that they are currently taking one day at a time. Pt. Was supportive of other group members and provided feedback about setting healthy boundaries in relationships.   Shaune PollackBrown, Blayke Cordrey B, LPC

## 2015-11-08 ENCOUNTER — Other Ambulatory Visit (HOSPITAL_COMMUNITY): Payer: BLUE CROSS/BLUE SHIELD | Admitting: Psychiatry

## 2015-11-08 DIAGNOSIS — F250 Schizoaffective disorder, bipolar type: Secondary | ICD-10-CM

## 2015-11-08 DIAGNOSIS — F3162 Bipolar disorder, current episode mixed, moderate: Secondary | ICD-10-CM | POA: Diagnosis not present

## 2015-11-08 NOTE — Progress Notes (Signed)
    Daily Group Progress Note  Program: IOP  Group Time: 9:00-10:30  Participation Level: Active  Behavioral Response: Appropriate  Type of Therapy:  Group Therapy  Summary of Progress: Pt. Presents as talkative, engaged in the group process, appropriately tearful. Pt. Shared challenge of moving furniture in her bedroom and grieving loss of her marriage. Pt. Received feedback from the group regarding taking it slow and listening to her emotional and physical energy regarding moving the furniture. Pt. Processed statements from her husband about his parents' feelings towards her.     Group Time: 10:30-12:00  Participation Level:  Active  Behavioral Response: Appropriate  Type of Therapy: Psycho-education Group  Summary of Progress: Pt. Participated in discussion about developing healthy relationships, developing healthy boundaries and emotional distance. Pt. Also participated discussion about developing daily behaviors to relieve stress burden such as uniform dressing, grocery services, bathing/teeth brushing, exercise, and morning coffee.   Shaune PollackBrown, Jennifer B, LPC

## 2015-11-09 ENCOUNTER — Other Ambulatory Visit (HOSPITAL_COMMUNITY): Payer: BLUE CROSS/BLUE SHIELD | Admitting: Psychiatry

## 2015-11-09 ENCOUNTER — Ambulatory Visit (HOSPITAL_COMMUNITY): Payer: Self-pay | Admitting: Clinical

## 2015-11-09 DIAGNOSIS — F250 Schizoaffective disorder, bipolar type: Secondary | ICD-10-CM

## 2015-11-09 DIAGNOSIS — F3162 Bipolar disorder, current episode mixed, moderate: Secondary | ICD-10-CM | POA: Diagnosis not present

## 2015-11-09 MED ORDER — LORAZEPAM 1 MG PO TABS: 1.0000 mg | ORAL_TABLET | Freq: Three times a day (TID) | ORAL | Status: AC | PRN

## 2015-11-09 MED ORDER — FLUPHENAZINE HCL 5 MG PO TABS: 5.0000 mg | ORAL_TABLET | Freq: Four times a day (QID) | ORAL | Status: DC

## 2015-11-09 NOTE — Patient Instructions (Signed)
Pt completed MH-IOP today.  Will follow up with Elana AlmFrankie Powell, LCSW on 11-30-15 @ 4:30 pm and Dr. Michae KavaAgarwal on 12-06-15 @ 11:30 am.  Encouraged support groups.

## 2015-11-09 NOTE — Addendum Note (Signed)
Addended by: Carolanne GrumblingAYLOR, GERALD D on: 11/09/2015 09:39 AM   Modules accepted: Orders

## 2015-11-09 NOTE — Progress Notes (Signed)
Teresa Bartlett is a 25 y.o. , married, unemployed, Caucasian female who was recently in [facility]-IOP (07-18-15 until 07-29-15). [facility]. Was there from 10-06-15 until 10-12-15. Her issues remain the same with depression and mood lability. She ran out of medications and started hallucinating and dissociating with a lot of paranoia; it was hard to separate from the disassociation. Upon admission, she was doing much better with the moods, no hallucination or disassociation after medications were changed in the hospital. Anxiety and depression have persisted due to her husband telling her she has to leave the home; even if she is homeless. No friend or family will let her live with them so she is very worried and scared. Over all she looks much better than on her last visit except for the new worries. Reported five past psychiatric hospitalizations. Pt [facility]. According to the pt, she began partial hospitalization 06/21/15. Pt reported the following diagnoses: Schizoaffective, MD, PTSD, Dissociative Disorder, and panic attacks. Pt stated that she began having severe mental health symptoms 4 years ago. Pt admitted to hx of self-harming. According to the Pt, she would scratch her arms until they bleed. She would also scratch old wounds. Stressors/Triggers: 1) Kids. Pt has an Autistic 496 yr old son and a 593 yr old son.  In-laws are paying daycare for the youngest. 2) Conflictual Relationships with family members. States she just recently reconnected with her paternal Grandmother. Pt is hoping that she will let her stay at her home for ~ three months, since husband is wanting her to move out. According to pt she has no support. 3) Medical Issues: Fibromyalgia among multiple other illnesses. Pt attended all scheduled days and was able to cope with estranged husband abandoning the marriage and taking the kids.  He  currently has an apartment in [place] with the kids.  Pt visited the kids last weekend.  States it went well.  Pt continues to live in their home, along [place] staying [place] in [place].  Pt plans to rent out a room and split the expenses in her home.  Pt is currently exploring job options.  Pt will complete all groups today.  Denies SI/HI or A/V hallucinations.  A:  D/C today.  F/U with Elana AlmFrankie Powell, LCSW on 11-30-15 @ 1630 and Dr. Michae KavaAgarwal on 12-06-15 @ 1130.  Encouraged support groups and [facility].  R:  Pt receptive.    Chestine SporeLARK, RITA, M.Ed, CNA

## 2015-11-09 NOTE — Progress Notes (Signed)
Patient ID: Teresa Bartlett, female   DOB: 09-May-1990, 25 y.o.   MRN: [id number] Discharge Note  Patient:  Teresa Holtslexandria Beehler is an 25 y.o., female DOB:  09-May-1990  Date of Admission:  10/13/2015  Date of Discharge: 11/09/2015  Reason for Admission:depression and hallucinations  IOP Course:  Ms Trixie DredgeShuford is doing very well for her usual self in that she is hearing no voices or having any paranoid or delusional thoughts.  The increase of perphenazine to 5 mg qid helped.  She cannot afford the injection.  Her husband left her and took the kids during her stay and she has handled that well.  She is actively looking for a job,  Depression and anxiety are better.  Mental Status at Discharge:no current psychosis. No suicidal thinking  Lab Results: No results found for this or any previous visit (from the past 48 hour(s)).   Current outpatient prescriptions:  .  albuterol (PROVENTIL HFA;VENTOLIN HFA) 108 (90 BASE) MCG/ACT inhaler, Inhale 2 puffs into the lungs every 6 (six) hours as needed for wheezing or shortness of breath., Disp: , Rfl:  .  fluPHENAZine (PROLIXIN) 5 MG tablet, Take 1 tablet (5 mg total) by mouth 4 (four) times daily., Disp: 120 tablet, Rfl: 1 .  gabapentin (NEURONTIN) 300 MG capsule, Take 1 capsule (300 mg total) by mouth 3 (three) times daily., Disp: 90 capsule, Rfl: 1 .  lithium carbonate 300 MG capsule, Take 1 capsule (300mg ) in the morning and 2 capsules (600mg ) every evening, Disp: 90 capsule, Rfl: 1 .  LORazepam (ATIVAN) 1 MG tablet, Take 1 tablet (1 mg total) by mouth every 8 (eight) hours as needed for anxiety., Disp: 30 tablet, Rfl: 0 .  trihexyphenidyl (ARTANE) 5 MG tablet, Take 1 tablet (5 mg total) by mouth 2 (two) times daily with a meal., Disp: 60 tablet, Rfl: 1 .  zolpidem (AMBIEN) 10 MG tablet, Take 1 tablet (10 mg total) by mouth at bedtime., Disp: 30 tablet, Rfl: 0  Axis Diagnosis:  Revert to the diagnosis of schizoaffective disorder, bipolar type   Level  of Care:  IOP  Discharge destination:  Other:  has appointments with therapist and psychiatrist  Is patient on multiple antipsychotic therapies at discharge:  No    Has Patient had three or more failed trials of antipsychotic monotherapy by history:  Negative  Patient phone:  [phone] (home)  Patient address:   [address]  [place] [address],   Follow-up recommendations:  Activity:  continue current activity Diet:  continue current diet  Comments:  none  The patient received suicide prevention pamphlet:  Yes Belongings returned:  na Carolanne GrumblingGerald Molleigh Huot 11/09/2015, 9:32 AM

## 2015-11-10 ENCOUNTER — Other Ambulatory Visit (HOSPITAL_COMMUNITY): Payer: BLUE CROSS/BLUE SHIELD

## 2015-11-10 NOTE — Progress Notes (Signed)
    Daily Group Progress Note  Program: IOP  Group Time: 9:00-10:30  Participation Level: Active  Behavioral Response: Appropriate  Type of Therapy:  Group Therapy  Summary of Progress: Pt. Prepared for discharge. Pt. Continues to grieve end of marriage, hopeful for reconciliation, working out financial problems, considering having a roommate and concerns about applying for disability.      Group Time: 10:30-12:00  Participation Level:  Active  Behavioral Response: Appropriate  Type of Therapy: Psycho-education Group  Summary of Progress: Pt. Watched and discussed Dewain PenningBrene Brown video about developing vulnerability for resilience from shame and ability to develop healthy relationships.   Shaune PollackBrown, Jennifer B, LPC

## 2015-11-11 ENCOUNTER — Other Ambulatory Visit (HOSPITAL_COMMUNITY): Payer: BLUE CROSS/BLUE SHIELD

## 2015-11-14 ENCOUNTER — Other Ambulatory Visit (HOSPITAL_COMMUNITY): Payer: BLUE CROSS/BLUE SHIELD

## 2015-11-15 ENCOUNTER — Other Ambulatory Visit (HOSPITAL_COMMUNITY): Payer: BLUE CROSS/BLUE SHIELD

## 2015-11-16 ENCOUNTER — Other Ambulatory Visit (HOSPITAL_COMMUNITY): Payer: BLUE CROSS/BLUE SHIELD

## 2015-11-17 ENCOUNTER — Other Ambulatory Visit (HOSPITAL_COMMUNITY): Payer: BLUE CROSS/BLUE SHIELD

## 2015-11-18 ENCOUNTER — Other Ambulatory Visit (HOSPITAL_COMMUNITY): Payer: BLUE CROSS/BLUE SHIELD

## 2015-11-20 IMAGING — CR DG FOOT COMPLETE 3+V*L*
3 series · 3 of 3 positions shown · non-contrast
Comparison: None.

CLINICAL DATA: Left foot and ankle pain status post inversion of
the ankle.

EXAM:
LEFT FOOT - COMPLETE 3+ VIEW; LEFT ANKLE COMPLETE - 3+ VIEW

[x foot ap left]
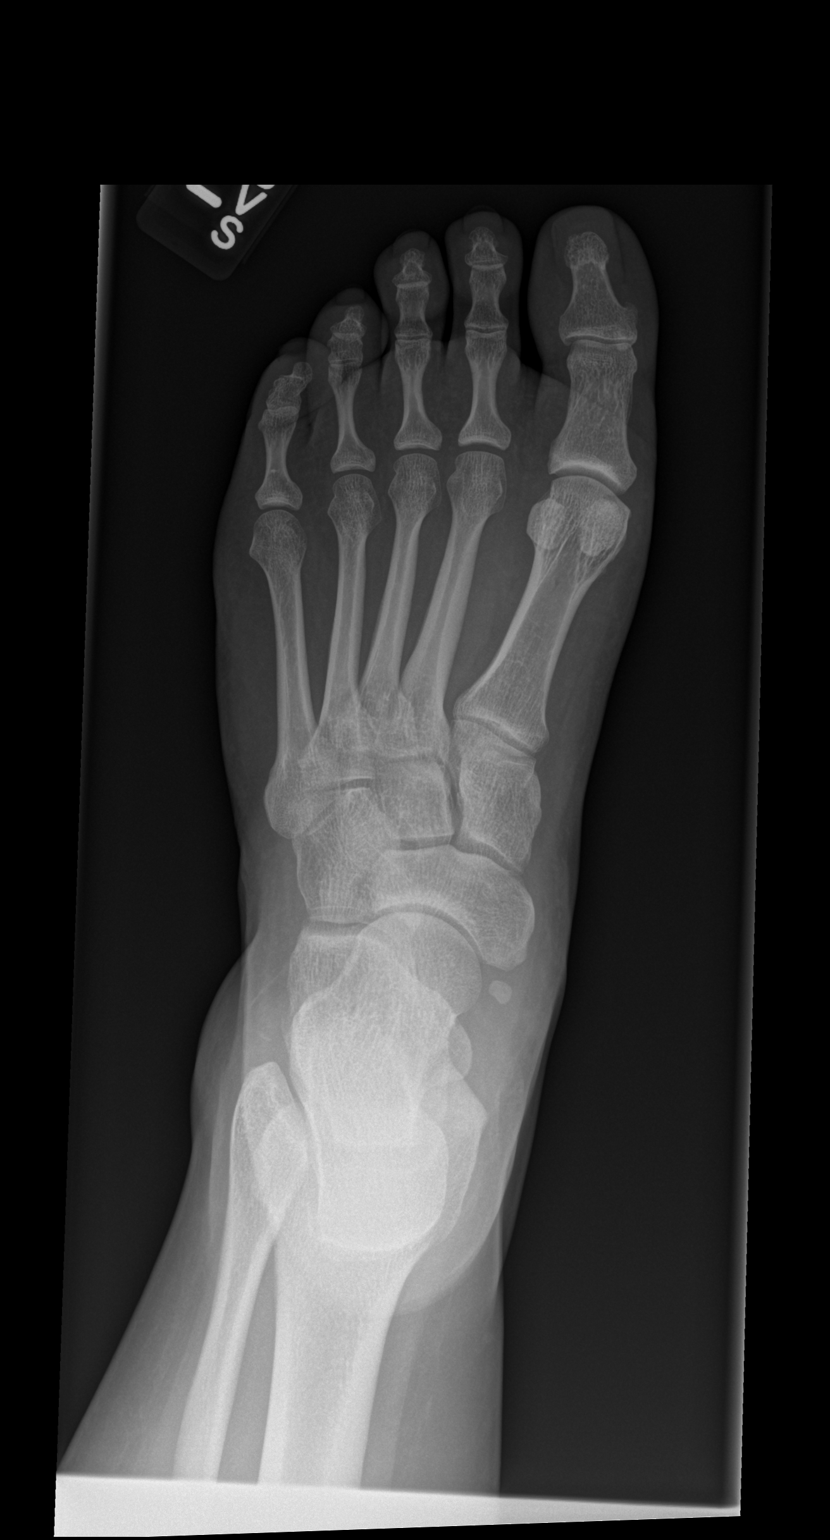

[x foot obl left]
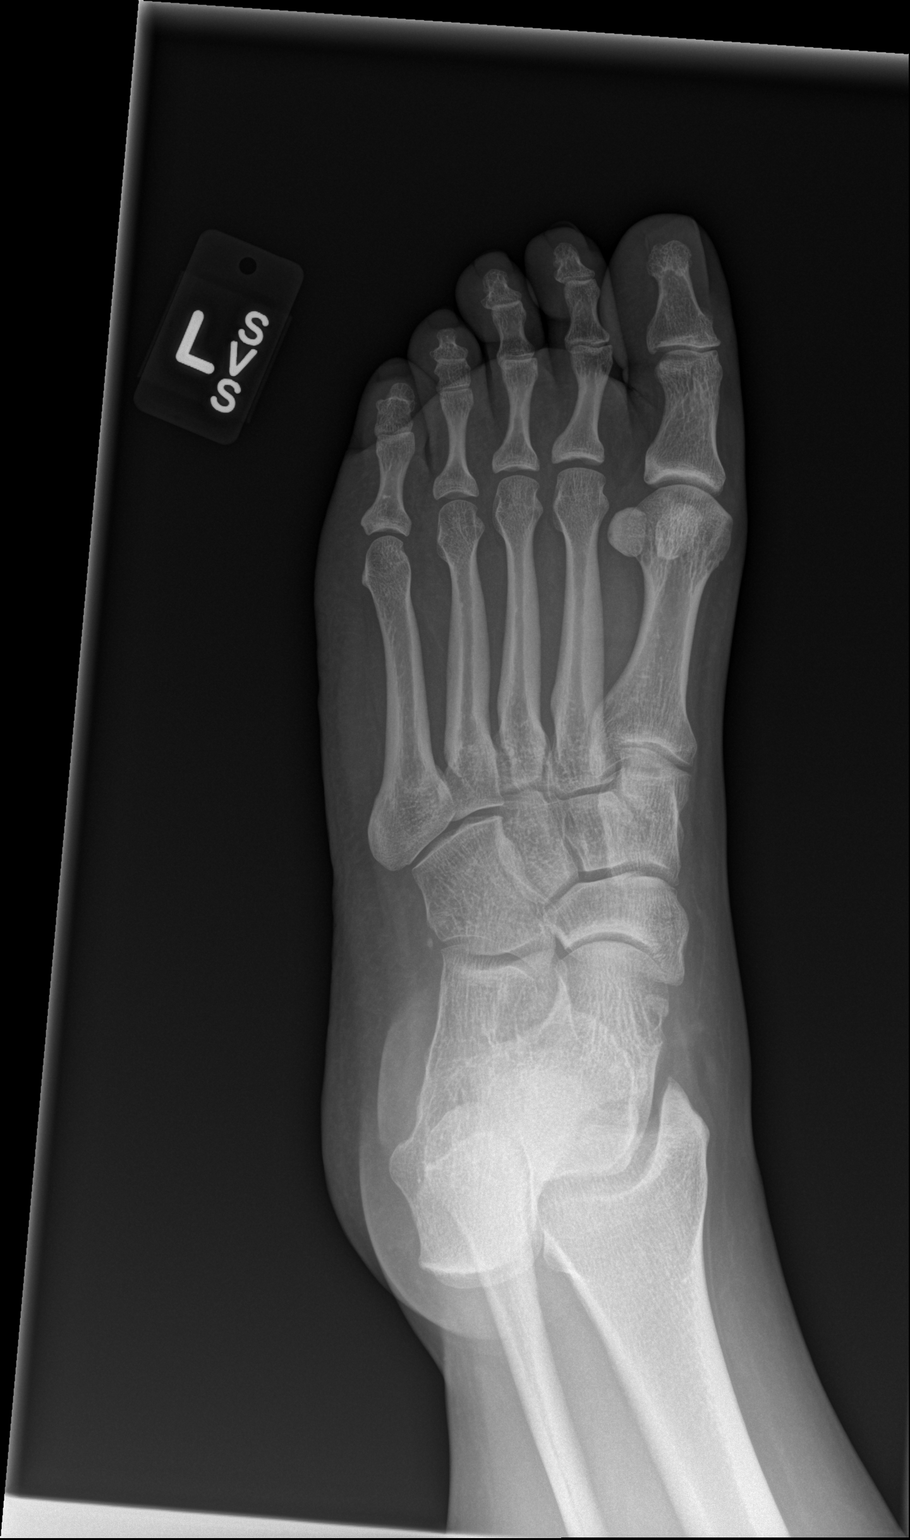

[x foot lat left]
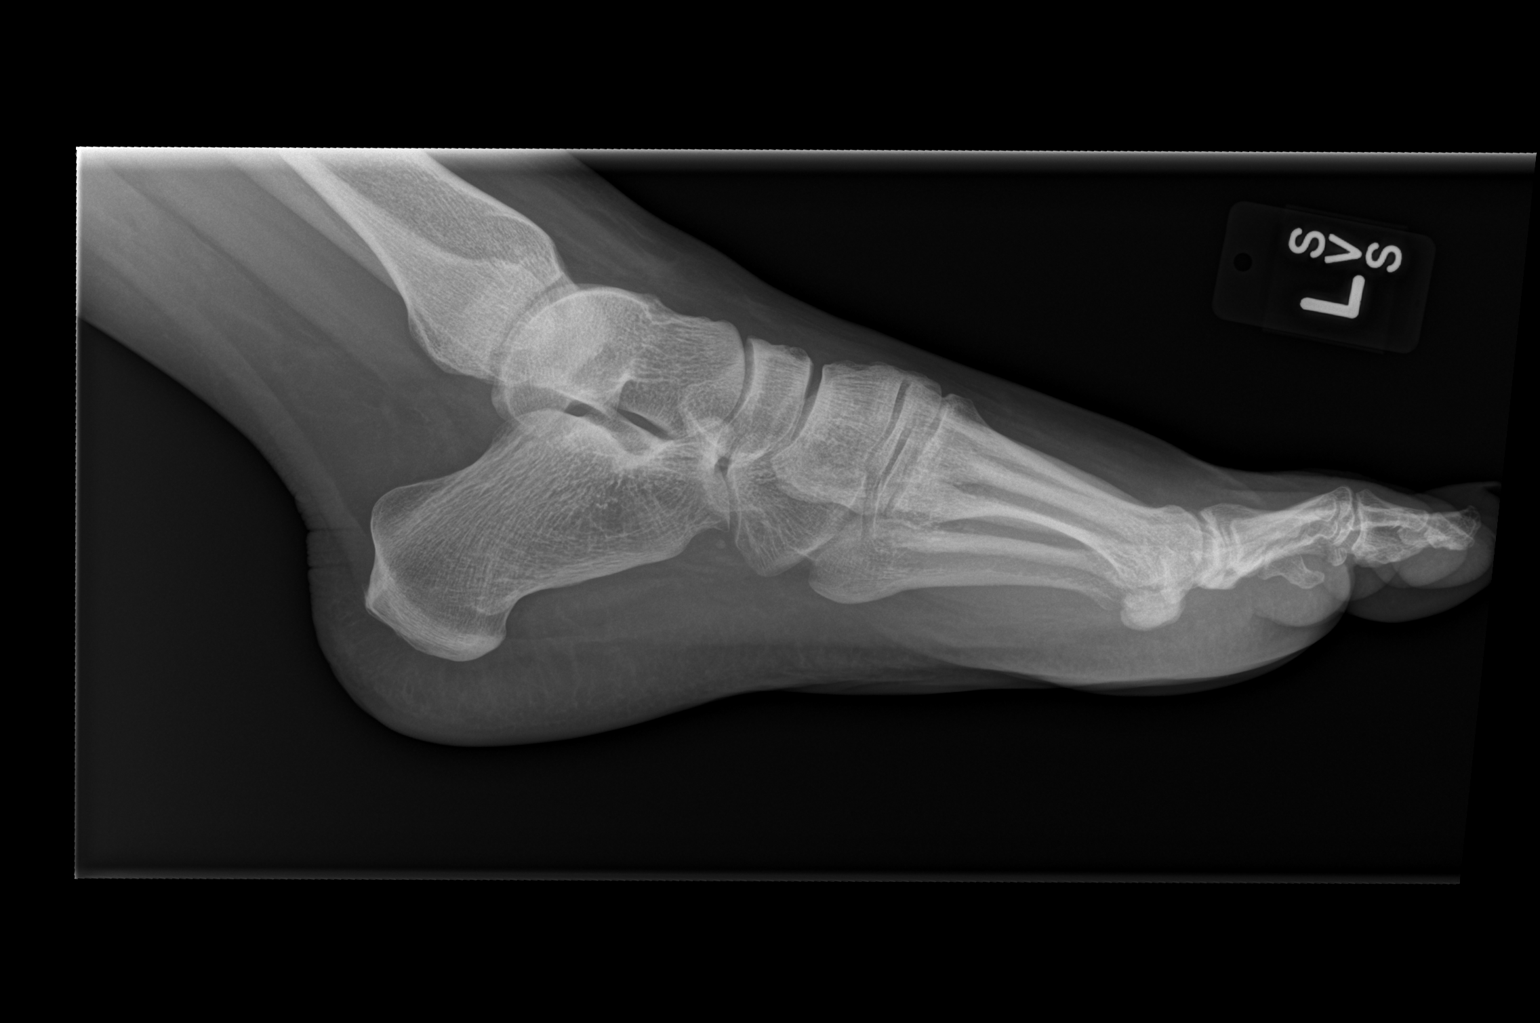

[3 of 3 positions shown; findings below may reference images not displayed]

FINDINGS: There is no evidence of fracture or dislocation. There is no
evidence of arthropathy or other focal bone abnormality. Soft
tissues are unremarkable.
IMPRESSION: No acute osseous abnormality identified.

## 2015-11-20 IMAGING — CR DG ANKLE COMPLETE 3+V*L*
3 series · 3 of 3 positions shown · non-contrast
Comparison: None.

CLINICAL DATA: Left foot and ankle pain status post inversion of
the ankle.

EXAM:
LEFT FOOT - COMPLETE 3+ VIEW; LEFT ANKLE COMPLETE - 3+ VIEW

[x ankle lat left (1 of 3)]
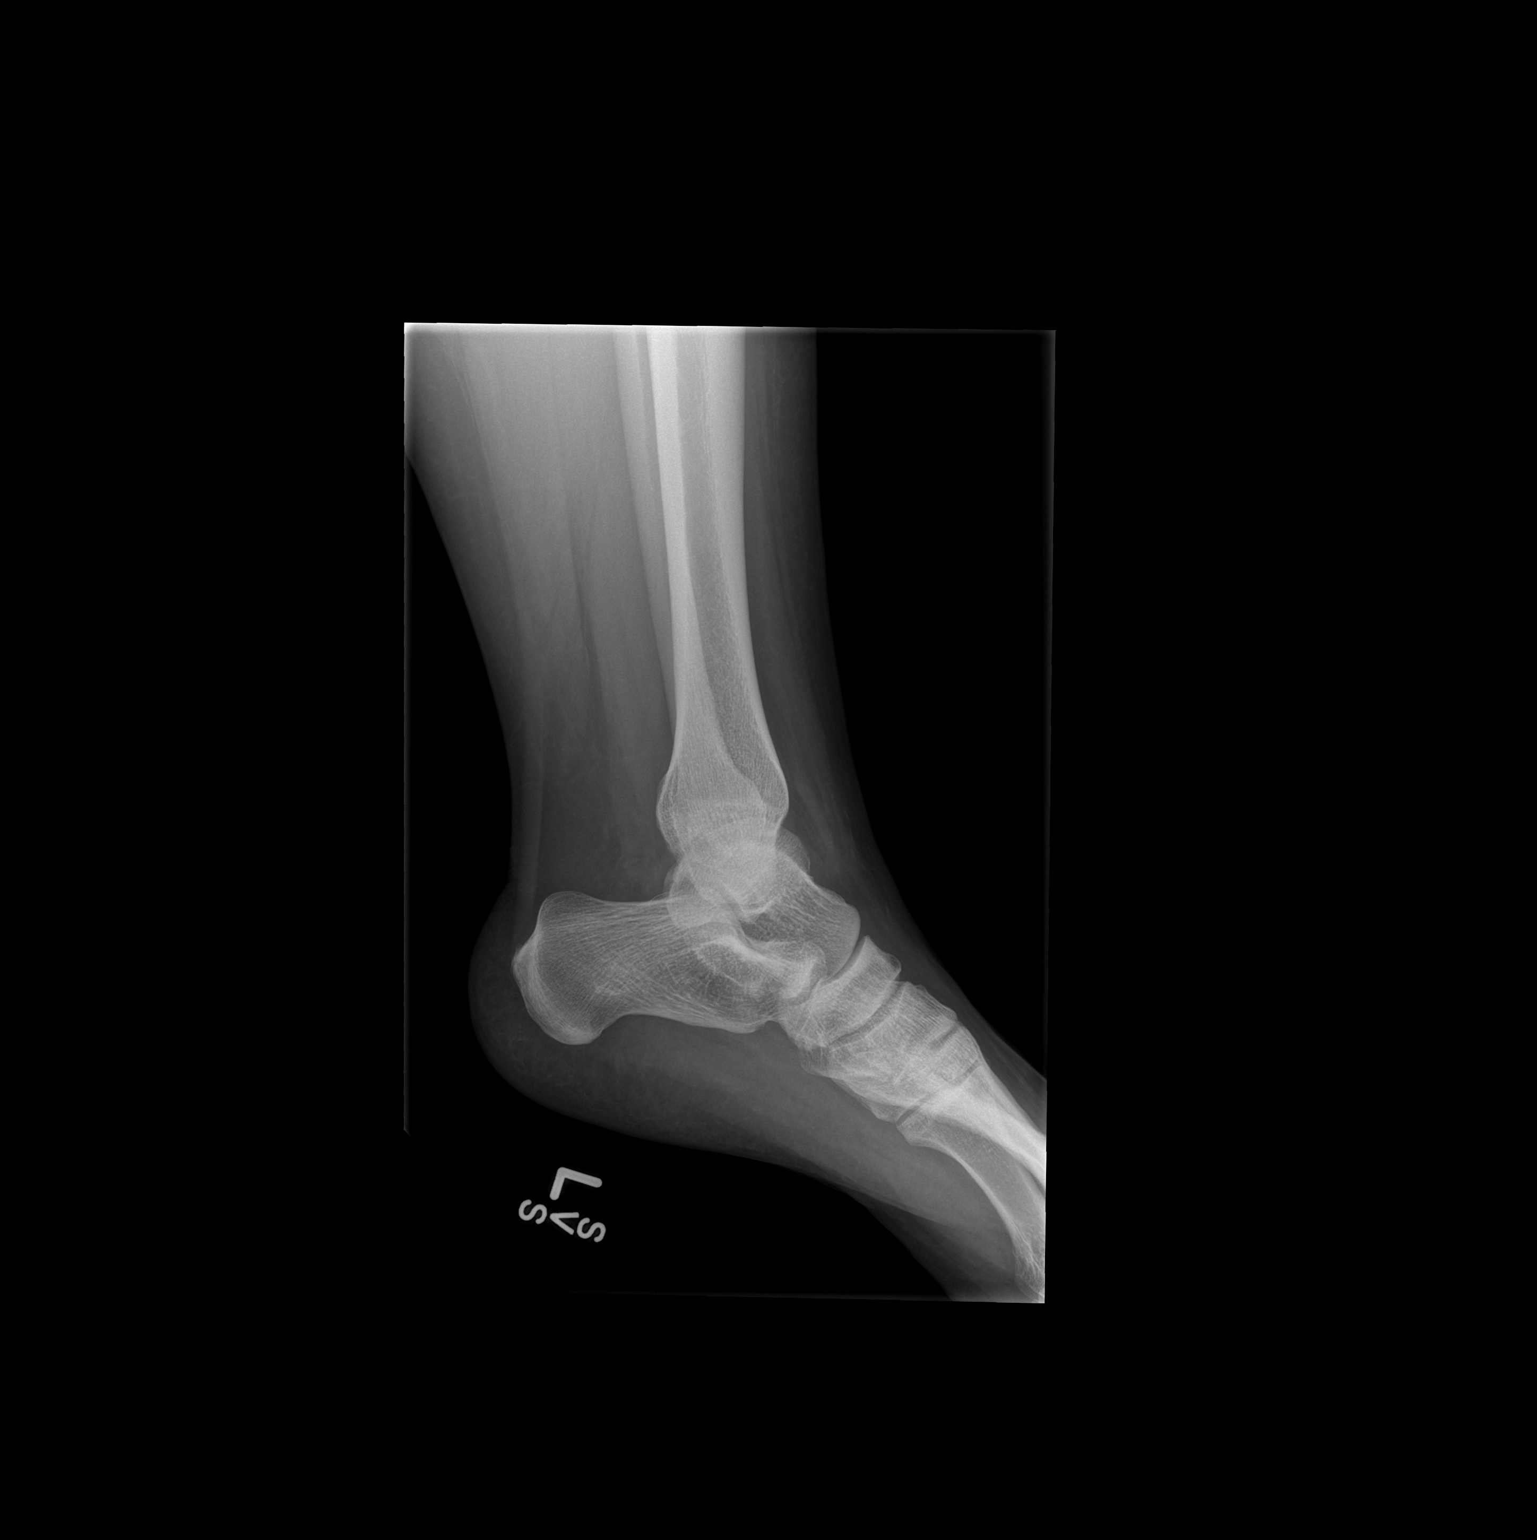

[x ankle lat left (2 of 3)]
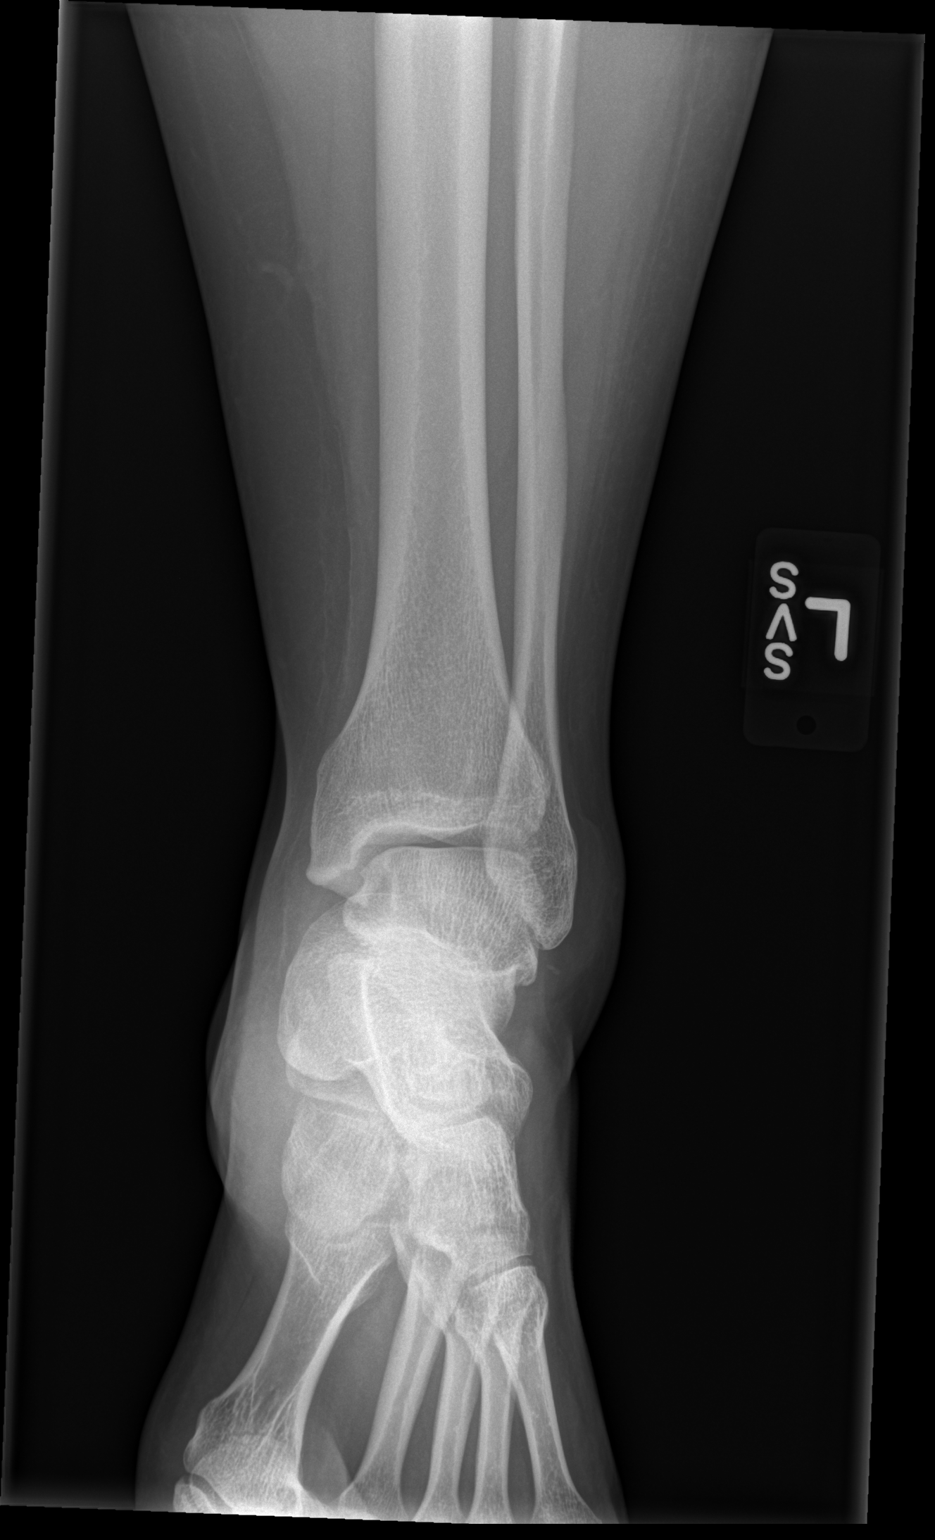

[x ankle lat left (3 of 3)]
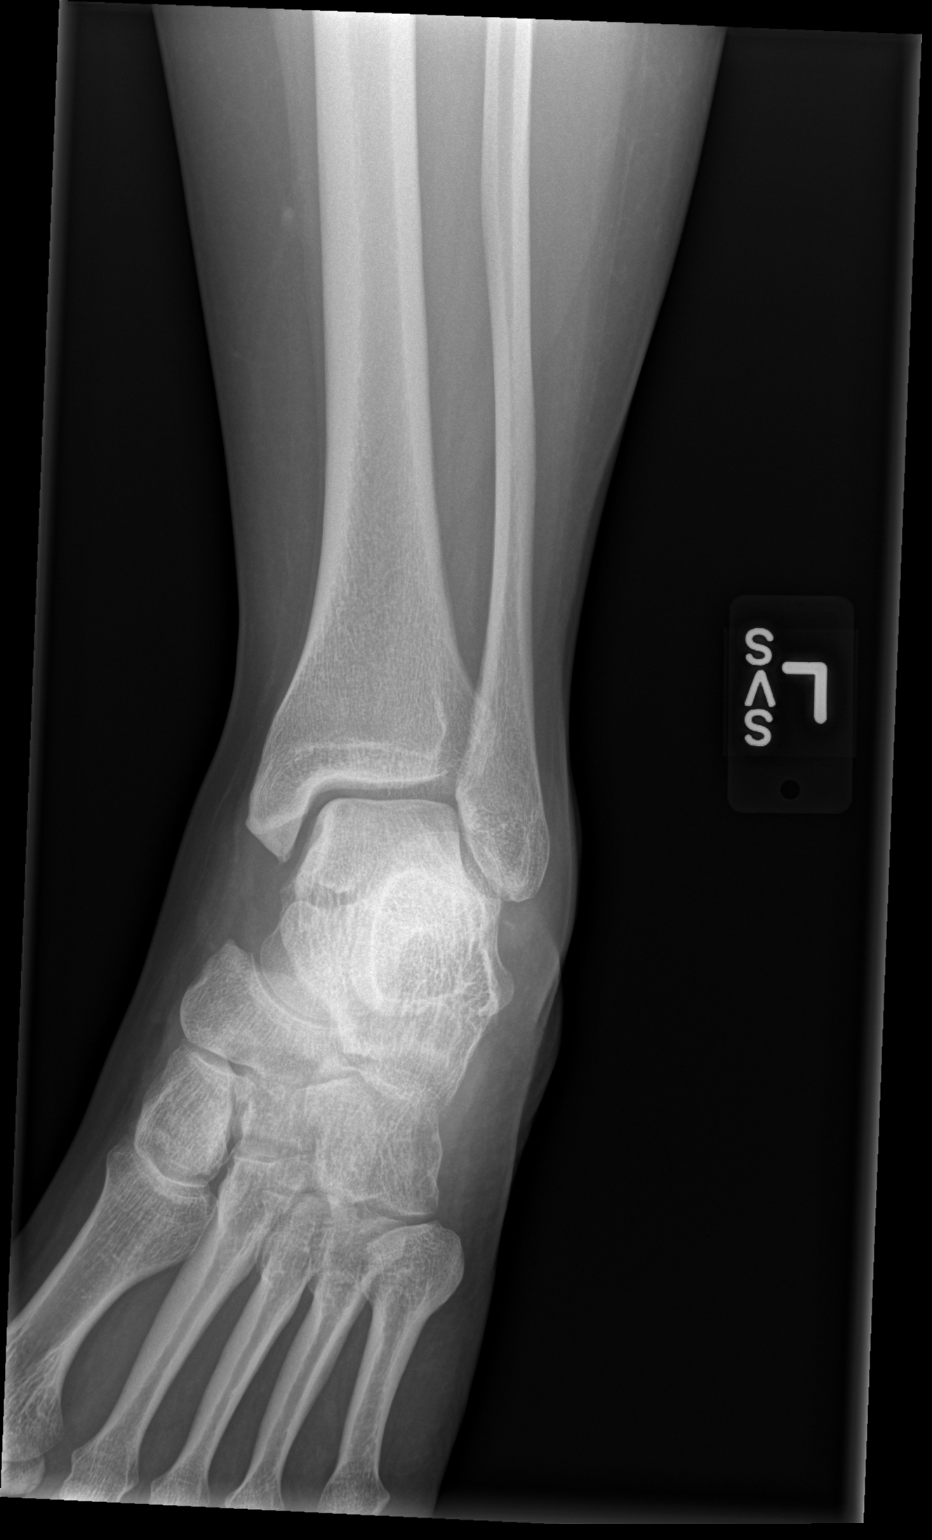

[3 of 3 positions shown; findings below may reference images not displayed]

FINDINGS: There is no evidence of fracture or dislocation. There is no
evidence of arthropathy or other focal bone abnormality. Soft
tissues are unremarkable.
IMPRESSION: No acute osseous abnormality identified.

## 2015-11-21 ENCOUNTER — Other Ambulatory Visit (HOSPITAL_COMMUNITY): Payer: BLUE CROSS/BLUE SHIELD

## 2015-11-22 ENCOUNTER — Other Ambulatory Visit (HOSPITAL_COMMUNITY): Payer: BLUE CROSS/BLUE SHIELD

## 2015-11-23 ENCOUNTER — Other Ambulatory Visit (HOSPITAL_COMMUNITY): Payer: BLUE CROSS/BLUE SHIELD

## 2015-11-25 ENCOUNTER — Other Ambulatory Visit (HOSPITAL_COMMUNITY): Payer: BLUE CROSS/BLUE SHIELD

## 2015-11-28 ENCOUNTER — Other Ambulatory Visit (HOSPITAL_COMMUNITY): Payer: BLUE CROSS/BLUE SHIELD

## 2015-11-29 ENCOUNTER — Other Ambulatory Visit (HOSPITAL_COMMUNITY): Payer: BLUE CROSS/BLUE SHIELD

## 2015-11-30 ENCOUNTER — Ambulatory Visit (INDEPENDENT_AMBULATORY_CARE_PROVIDER_SITE_OTHER): Payer: BLUE CROSS/BLUE SHIELD | Admitting: Clinical

## 2015-11-30 ENCOUNTER — Other Ambulatory Visit (HOSPITAL_COMMUNITY): Payer: BLUE CROSS/BLUE SHIELD

## 2015-11-30 ENCOUNTER — Encounter (HOSPITAL_COMMUNITY): Payer: Self-pay | Admitting: Clinical

## 2015-11-30 DIAGNOSIS — F5081 Binge eating disorder: Secondary | ICD-10-CM | POA: Diagnosis not present

## 2015-11-30 DIAGNOSIS — F250 Schizoaffective disorder, bipolar type: Secondary | ICD-10-CM

## 2015-12-01 ENCOUNTER — Other Ambulatory Visit (HOSPITAL_COMMUNITY): Payer: BLUE CROSS/BLUE SHIELD
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
## 2015-12-06 ENCOUNTER — Encounter (HOSPITAL_COMMUNITY): Payer: Self-pay | Admitting: Psychiatry

## 2015-12-06 ENCOUNTER — Ambulatory Visit (INDEPENDENT_AMBULATORY_CARE_PROVIDER_SITE_OTHER): Payer: BLUE CROSS/BLUE SHIELD | Admitting: Psychiatry

## 2015-12-06 VITALS — BP 102/66 | HR 65 | Ht 64.0 in | Wt 244.0 lb

## 2015-12-06 DIAGNOSIS — F5081 Binge eating disorder: Secondary | ICD-10-CM

## 2015-12-06 DIAGNOSIS — F411 Generalized anxiety disorder: Secondary | ICD-10-CM | POA: Diagnosis not present

## 2015-12-06 DIAGNOSIS — R45851 Suicidal ideations: Secondary | ICD-10-CM | POA: Diagnosis not present

## 2015-12-06 DIAGNOSIS — F250 Schizoaffective disorder, bipolar type: Secondary | ICD-10-CM | POA: Diagnosis not present

## 2015-12-06 DIAGNOSIS — Z79899 Other long term (current) drug therapy: Secondary | ICD-10-CM

## 2015-12-06 MED ORDER — FLUPHENAZINE HCL 5 MG PO TABS: 5.0000 mg | ORAL_TABLET | Freq: Four times a day (QID) | ORAL | Status: AC

## 2015-12-06 MED ORDER — TRIHEXYPHENIDYL HCL 5 MG PO TABS: 5.0000 mg | ORAL_TABLET | Freq: Three times a day (TID) | ORAL | Status: AC

## 2015-12-06 MED ORDER — GABAPENTIN 300 MG PO CAPS: 300.0000 mg | ORAL_CAPSULE | Freq: Three times a day (TID) | ORAL | Status: DC

## 2015-12-06 MED ORDER — LITHIUM CARBONATE 600 MG PO CAPS: 600.0000 mg | ORAL_CAPSULE | Freq: Two times a day (BID) | ORAL | Status: AC
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
## 2015-12-23 LAB — COMPREHENSIVE METABOLIC PANEL
ALK PHOS: 64 U/L (ref 33–115)
ALT: 10 U/L (ref 6–29)
AST: 15 U/L (ref 10–30)
Albumin: 4.1 g/dL (ref 3.6–5.1)
BUN: 6 mg/dL — AB (ref 7–25)
CO2: 30 mmol/L (ref 20–31)
Calcium: 9.9 mg/dL (ref 8.6–10.2)
Chloride: 101 mmol/L (ref 98–110)
Creat: 0.74 mg/dL (ref 0.50–1.10)
GLUCOSE: 78 mg/dL (ref 65–99)
POTASSIUM: 3.9 mmol/L (ref 3.5–5.3)
Sodium: 140 mmol/L (ref 135–146)
Total Bilirubin: 0.4 mg/dL (ref 0.2–1.2)
Total Protein: 7 g/dL (ref 6.1–8.1)

## 2015-12-23 LAB — CBC
HCT: 37.6 % (ref 36.0–46.0)
HEMOGLOBIN: 12.4 g/dL (ref 12.0–15.0)
MCH: 29.4 pg (ref 26.0–34.0)
MCHC: 33 g/dL (ref 30.0–36.0)
MCV: 89.1 fL (ref 78.0–100.0)
MPV: 10.6 fL (ref 8.6–12.4)
Platelets: 377 10*3/uL (ref 150–400)
RBC: 4.22 MIL/uL (ref 3.87–5.11)
RDW: 13.5 % (ref 11.5–15.5)
WBC: 10.4 10*3/uL (ref 4.0–10.5)

## 2015-12-23 LAB — TSH: TSH: 1.922 u[IU]/mL (ref 0.350–4.500)

## 2015-12-23 LAB — LITHIUM LEVEL: LITHIUM LVL: 1.1 meq/L (ref 0.80–1.40)

## 2016-01-19 ENCOUNTER — Ambulatory Visit (HOSPITAL_COMMUNITY): Payer: Self-pay | Admitting: Psychiatry

## 2016-02-16 ENCOUNTER — Telehealth (HOSPITAL_COMMUNITY): Payer: Self-pay

## 2016-02-16 NOTE — Telephone Encounter (Signed)
Patient calling, she is currently taking Prolixin but has lost her insurance and can not afford the medication anymore. She states that in the past she did really well on Haldol and she can get that at [facility] for 4 dollars. She would like to know if she can change medications from Prolixin to Haldol. Please advise, thank you

## 2016-02-16 NOTE — Telephone Encounter (Signed)
Pt needs an appt to we can discuss medication changes.

## 2016-02-17 NOTE — Telephone Encounter (Signed)
I called the patient and left voicemail to  let her know that in order to change medications she would need to call back and make an appointment.

## 2016-02-20 ENCOUNTER — Telehealth (HOSPITAL_COMMUNITY): Payer: Self-pay

## 2016-02-20 DIAGNOSIS — F411 Generalized anxiety disorder: Secondary | ICD-10-CM

## 2016-02-20 NOTE — Telephone Encounter (Signed)
Patient is calling for a refill on her Gabapentin last filled at office visit 10/27/2015 - she has a follow up on 3/9. Okay to refill? Please advise, thank you.

## 2016-02-21 MED ORDER — GABAPENTIN 300 MG PO CAPS: 300.0000 mg | ORAL_CAPSULE | Freq: Three times a day (TID) | ORAL | Status: AC

## 2016-02-21 NOTE — Telephone Encounter (Signed)
Yes we can refill with enough meds to get to her scheduled appt on 3/9

## 2016-02-21 NOTE — Telephone Encounter (Signed)
I sent in the gabapentin, 16 days worth to get her through until her next appointment

## 2016-03-08 ENCOUNTER — Ambulatory Visit (HOSPITAL_COMMUNITY): Payer: Self-pay | Admitting: Psychiatry

## 2017-08-02 NOTE — Progress Notes (Signed)
Closed

## 2017-09-17 NOTE — Progress Notes (Signed)
Sign
